# Patient Record
Sex: Male | Born: 1950
Health system: Southern US, Community
[De-identification: ages and names within clinical notes are randomized; demographics above are authoritative.]

## PROBLEM LIST (undated history)

## (undated) DIAGNOSIS — K279 Peptic ulcer, site unspecified, unspecified as acute or chronic, without hemorrhage or perforation: Secondary | ICD-10-CM

## (undated) DIAGNOSIS — N200 Calculus of kidney: Secondary | ICD-10-CM

## (undated) DIAGNOSIS — E663 Overweight: Secondary | ICD-10-CM

## (undated) DIAGNOSIS — D649 Anemia, unspecified: Secondary | ICD-10-CM

## (undated) DIAGNOSIS — I10 Essential (primary) hypertension: Secondary | ICD-10-CM

## (undated) DIAGNOSIS — I2510 Atherosclerotic heart disease of native coronary artery without angina pectoris: Secondary | ICD-10-CM

## (undated) DIAGNOSIS — E785 Hyperlipidemia, unspecified: Secondary | ICD-10-CM

## (undated) DIAGNOSIS — Z9289 Personal history of other medical treatment: Secondary | ICD-10-CM

## (undated) DIAGNOSIS — K219 Gastro-esophageal reflux disease without esophagitis: Secondary | ICD-10-CM

## (undated) DIAGNOSIS — C61 Malignant neoplasm of prostate: Secondary | ICD-10-CM

## (undated) HISTORY — DX: Hyperlipidemia, unspecified: E78.5

## (undated) HISTORY — DX: Gastro-esophageal reflux disease without esophagitis: K21.9

## (undated) HISTORY — DX: Peptic ulcer, site unspecified, unspecified as acute or chronic, without hemorrhage or perforation: K27.9

## (undated) HISTORY — DX: Essential (primary) hypertension: I10

## (undated) HISTORY — DX: Atherosclerotic heart disease of native coronary artery without angina pectoris: I25.10

## (undated) HISTORY — DX: Personal history of other medical treatment: Z92.89

## (undated) HISTORY — PX: TONSILLECTOMY: SUR1361

## (undated) HISTORY — DX: Calculus of kidney: N20.0

## (undated) HISTORY — DX: Overweight: E66.3

---

## 1997-10-10 ENCOUNTER — Encounter: Admission: RE | Admit: 1997-10-10 | Discharge: 1997-10-10 | Payer: Self-pay | Admitting: Family Medicine

## 1997-10-22 ENCOUNTER — Encounter: Admission: RE | Admit: 1997-10-22 | Discharge: 1997-10-22 | Payer: Self-pay | Admitting: Family Medicine

## 1997-12-21 ENCOUNTER — Encounter: Admission: RE | Admit: 1997-12-21 | Discharge: 1997-12-21 | Payer: Self-pay | Admitting: Family Medicine

## 1998-01-04 ENCOUNTER — Encounter: Admission: RE | Admit: 1998-01-04 | Discharge: 1998-01-04 | Payer: Self-pay | Admitting: Family Medicine

## 1998-10-28 ENCOUNTER — Encounter: Admission: RE | Admit: 1998-10-28 | Discharge: 1998-10-28 | Payer: Self-pay | Admitting: Family Medicine

## 1999-02-21 ENCOUNTER — Encounter: Admission: RE | Admit: 1999-02-21 | Discharge: 1999-02-21 | Payer: Self-pay | Admitting: Family Medicine

## 1999-07-22 ENCOUNTER — Encounter: Admission: RE | Admit: 1999-07-22 | Discharge: 1999-07-22 | Payer: Self-pay | Admitting: Sports Medicine

## 1999-09-22 ENCOUNTER — Encounter: Admission: RE | Admit: 1999-09-22 | Discharge: 1999-09-22 | Payer: Self-pay | Admitting: Family Medicine

## 2000-03-10 ENCOUNTER — Encounter: Admission: RE | Admit: 2000-03-10 | Discharge: 2000-03-10 | Payer: Self-pay | Admitting: Family Medicine

## 2001-06-14 ENCOUNTER — Encounter: Admission: RE | Admit: 2001-06-14 | Discharge: 2001-06-14 | Payer: Self-pay | Admitting: Family Medicine

## 2002-01-05 ENCOUNTER — Encounter: Admission: RE | Admit: 2002-01-05 | Discharge: 2002-01-05 | Payer: Self-pay | Admitting: Family Medicine

## 2002-02-01 ENCOUNTER — Encounter: Admission: RE | Admit: 2002-02-01 | Discharge: 2002-02-01 | Payer: Self-pay | Admitting: Family Medicine

## 2002-08-23 ENCOUNTER — Encounter: Admission: RE | Admit: 2002-08-23 | Discharge: 2002-08-23 | Payer: Self-pay | Admitting: Family Medicine

## 2002-08-24 ENCOUNTER — Encounter: Admission: RE | Admit: 2002-08-24 | Discharge: 2002-08-24 | Payer: Self-pay | Admitting: Family Medicine

## 2003-03-15 ENCOUNTER — Encounter: Admission: RE | Admit: 2003-03-15 | Discharge: 2003-03-15 | Payer: Self-pay | Admitting: Sports Medicine

## 2003-04-06 ENCOUNTER — Encounter: Admission: RE | Admit: 2003-04-06 | Discharge: 2003-04-06 | Payer: Self-pay | Admitting: Family Medicine

## 2003-08-23 ENCOUNTER — Encounter: Admission: RE | Admit: 2003-08-23 | Discharge: 2003-08-23 | Payer: Self-pay | Admitting: Sports Medicine

## 2003-10-29 ENCOUNTER — Encounter: Admission: RE | Admit: 2003-10-29 | Discharge: 2003-10-29 | Payer: Self-pay | Admitting: Family Medicine

## 2003-11-08 ENCOUNTER — Encounter: Admission: RE | Admit: 2003-11-08 | Discharge: 2003-11-08 | Payer: Self-pay | Admitting: Family Medicine

## 2004-03-21 ENCOUNTER — Ambulatory Visit: Payer: Self-pay | Admitting: Family Medicine

## 2004-06-06 ENCOUNTER — Ambulatory Visit: Payer: Self-pay | Admitting: Sports Medicine

## 2004-12-31 ENCOUNTER — Ambulatory Visit: Payer: Self-pay | Admitting: Family Medicine

## 2005-02-16 ENCOUNTER — Ambulatory Visit: Payer: Self-pay | Admitting: Family Medicine

## 2006-02-09 ENCOUNTER — Ambulatory Visit: Payer: Self-pay | Admitting: Sports Medicine

## 2006-06-04 ENCOUNTER — Encounter (INDEPENDENT_AMBULATORY_CARE_PROVIDER_SITE_OTHER): Payer: Self-pay | Admitting: Family Medicine

## 2006-06-04 ENCOUNTER — Ambulatory Visit: Payer: Self-pay | Admitting: Family Medicine

## 2006-06-04 LAB — CONVERTED CEMR LAB
AST: 27 units/L (ref 0–37)
Alkaline Phosphatase: 68 units/L (ref 39–117)
BUN: 19 mg/dL (ref 6–23)
Creatinine, Ser: 1.18 mg/dL (ref 0.40–1.50)
Potassium: 3.4 meq/L — ABNORMAL LOW (ref 3.5–5.3)
Total Bilirubin: 0.5 mg/dL (ref 0.3–1.2)

## 2006-06-09 ENCOUNTER — Ambulatory Visit: Payer: Self-pay | Admitting: Gastroenterology

## 2006-07-13 ENCOUNTER — Ambulatory Visit: Payer: Self-pay | Admitting: Gastroenterology

## 2006-07-13 ENCOUNTER — Encounter (INDEPENDENT_AMBULATORY_CARE_PROVIDER_SITE_OTHER): Payer: Self-pay | Admitting: Specialist

## 2006-07-13 ENCOUNTER — Encounter: Payer: Self-pay | Admitting: Family Medicine

## 2006-07-22 DIAGNOSIS — I10 Essential (primary) hypertension: Secondary | ICD-10-CM

## 2006-07-22 DIAGNOSIS — E785 Hyperlipidemia, unspecified: Secondary | ICD-10-CM

## 2006-07-22 DIAGNOSIS — J309 Allergic rhinitis, unspecified: Secondary | ICD-10-CM | POA: Insufficient documentation

## 2006-08-02 ENCOUNTER — Telehealth (INDEPENDENT_AMBULATORY_CARE_PROVIDER_SITE_OTHER): Payer: Self-pay | Admitting: *Deleted

## 2006-08-02 ENCOUNTER — Encounter: Payer: Self-pay | Admitting: Family Medicine

## 2006-08-02 ENCOUNTER — Ambulatory Visit: Payer: Self-pay | Admitting: Family Medicine

## 2006-08-02 LAB — CONVERTED CEMR LAB
BUN: 17 mg/dL (ref 6–23)
Blood in Urine, dipstick: NEGATIVE
Chloride: 101 meq/L (ref 96–112)
Glucose, Bld: 93 mg/dL (ref 70–99)
Ketones, urine, test strip: NEGATIVE
Nitrite: NEGATIVE
Potassium: 3.4 meq/L — ABNORMAL LOW (ref 3.5–5.3)
Protein, U semiquant: NEGATIVE
Urobilinogen, UA: 1

## 2007-03-30 ENCOUNTER — Encounter: Payer: Self-pay | Admitting: Family Medicine

## 2007-03-30 ENCOUNTER — Ambulatory Visit: Payer: Self-pay | Admitting: Family Medicine

## 2007-03-30 LAB — CONVERTED CEMR LAB
AST: 26 units/L (ref 0–37)
Albumin: 4.3 g/dL (ref 3.5–5.2)
Alkaline Phosphatase: 63 units/L (ref 39–117)
Indirect Bilirubin: 0.3 mg/dL (ref 0.0–0.9)
Total Protein: 7.5 g/dL (ref 6.0–8.3)

## 2008-01-20 ENCOUNTER — Encounter (INDEPENDENT_AMBULATORY_CARE_PROVIDER_SITE_OTHER): Payer: Self-pay | Admitting: *Deleted

## 2008-01-20 ENCOUNTER — Ambulatory Visit: Payer: Self-pay | Admitting: Family Medicine

## 2008-02-03 ENCOUNTER — Encounter: Payer: Self-pay | Admitting: Family Medicine

## 2008-02-03 ENCOUNTER — Ambulatory Visit: Payer: Self-pay | Admitting: Family Medicine

## 2008-02-03 DIAGNOSIS — E663 Overweight: Secondary | ICD-10-CM

## 2008-02-03 DIAGNOSIS — E669 Obesity, unspecified: Secondary | ICD-10-CM | POA: Insufficient documentation

## 2008-02-08 LAB — CONVERTED CEMR LAB
ALT: 26 units/L (ref 0–53)
AST: 30 units/L (ref 0–37)
BUN: 19 mg/dL (ref 6–23)
CO2: 25 meq/L (ref 19–32)
Cholesterol: 159 mg/dL (ref 0–200)
Creatinine, Ser: 1.25 mg/dL (ref 0.40–1.50)
HDL: 43 mg/dL (ref >39)
Total Bilirubin: 0.4 mg/dL (ref 0.3–1.2)
Total CHOL/HDL Ratio: 3.7
VLDL: 22 mg/dL (ref 0–40)

## 2008-03-06 ENCOUNTER — Encounter: Payer: Self-pay | Admitting: Family Medicine

## 2008-05-25 DIAGNOSIS — D649 Anemia, unspecified: Secondary | ICD-10-CM

## 2008-05-25 HISTORY — DX: Anemia, unspecified: D64.9

## 2008-05-25 HISTORY — PX: CARDIAC CATHETERIZATION: SHX172

## 2008-08-15 ENCOUNTER — Telehealth: Payer: Self-pay | Admitting: Family Medicine

## 2008-08-31 ENCOUNTER — Ambulatory Visit: Payer: Self-pay | Admitting: Family Medicine

## 2008-08-31 LAB — CONVERTED CEMR LAB

## 2008-10-02 ENCOUNTER — Telehealth: Payer: Self-pay | Admitting: Family Medicine

## 2008-10-02 ENCOUNTER — Ambulatory Visit (HOSPITAL_COMMUNITY): Admission: RE | Admit: 2008-10-02 | Discharge: 2008-10-02 | Payer: Self-pay | Admitting: Family Medicine

## 2008-10-02 ENCOUNTER — Ambulatory Visit: Payer: Self-pay | Admitting: Family Medicine

## 2008-10-02 DIAGNOSIS — R9431 Abnormal electrocardiogram [ECG] [EKG]: Secondary | ICD-10-CM

## 2008-10-02 DIAGNOSIS — Z9289 Personal history of other medical treatment: Secondary | ICD-10-CM

## 2008-10-02 HISTORY — DX: Personal history of other medical treatment: Z92.89

## 2008-10-08 ENCOUNTER — Encounter: Admission: RE | Admit: 2008-10-08 | Discharge: 2008-10-08 | Payer: Self-pay | Admitting: Cardiovascular Disease

## 2008-10-12 ENCOUNTER — Encounter: Payer: Self-pay | Admitting: Family Medicine

## 2008-10-12 ENCOUNTER — Inpatient Hospital Stay (HOSPITAL_COMMUNITY): Admission: AD | Admit: 2008-10-12 | Discharge: 2008-10-13 | Payer: Self-pay | Admitting: Cardiovascular Disease

## 2008-10-13 ENCOUNTER — Ambulatory Visit: Payer: Self-pay | Admitting: Internal Medicine

## 2008-10-13 ENCOUNTER — Encounter: Payer: Self-pay | Admitting: Internal Medicine

## 2008-10-15 ENCOUNTER — Encounter: Payer: Self-pay | Admitting: Internal Medicine

## 2008-10-19 ENCOUNTER — Telehealth: Payer: Self-pay | Admitting: Internal Medicine

## 2008-10-29 ENCOUNTER — Encounter: Payer: Self-pay | Admitting: Family Medicine

## 2008-11-02 ENCOUNTER — Encounter: Payer: Self-pay | Admitting: Family Medicine

## 2008-11-06 ENCOUNTER — Encounter: Payer: Self-pay | Admitting: Family Medicine

## 2008-11-07 ENCOUNTER — Encounter: Payer: Self-pay | Admitting: Family Medicine

## 2008-11-07 DIAGNOSIS — Q279 Congenital malformation of peripheral vascular system, unspecified: Secondary | ICD-10-CM | POA: Insufficient documentation

## 2008-12-23 HISTORY — PX: APPENDECTOMY: SHX54

## 2009-03-18 ENCOUNTER — Ambulatory Visit: Payer: Self-pay | Admitting: Family Medicine

## 2009-03-18 ENCOUNTER — Encounter: Payer: Self-pay | Admitting: Family Medicine

## 2009-03-18 DIAGNOSIS — D509 Iron deficiency anemia, unspecified: Secondary | ICD-10-CM | POA: Insufficient documentation

## 2009-03-19 ENCOUNTER — Encounter: Payer: Self-pay | Admitting: Family Medicine

## 2009-03-19 LAB — CONVERTED CEMR LAB
AST: 26 units/L (ref 0–37)
Albumin: 4.4 g/dL (ref 3.5–5.2)
Alkaline Phosphatase: 65 units/L (ref 39–117)
BUN: 14 mg/dL (ref 6–23)
Basophils Absolute: 0 10*3/uL (ref 0.0–0.1)
Basophils Relative: 1 % (ref 0–1)
Creatinine, Ser: 1.29 mg/dL (ref 0.40–1.50)
Direct LDL: 112 mg/dL — ABNORMAL HIGH
Eosinophils Absolute: 0.3 10*3/uL (ref 0.0–0.7)
Eosinophils Relative: 4 % (ref 0–5)
Glucose, Bld: 89 mg/dL (ref 70–99)
HCT: 42.7 % (ref 39.0–52.0)
Hemoglobin: 14 g/dL (ref 13.0–17.0)
Lymphocytes Relative: 30 % (ref 12–46)
MCHC: 32.8 g/dL (ref 30.0–36.0)
Monocytes Absolute: 0.7 10*3/uL (ref 0.1–1.0)
Platelets: 240 10*3/uL (ref 150–400)
RDW: 13.6 % (ref 11.5–15.5)
Total Bilirubin: 0.4 mg/dL (ref 0.3–1.2)

## 2009-03-27 ENCOUNTER — Encounter: Payer: Self-pay | Admitting: Family Medicine

## 2009-03-28 LAB — CONVERTED CEMR LAB
OCCULT 1: POSITIVE
OCCULT 2: POSITIVE

## 2009-04-08 ENCOUNTER — Telehealth: Payer: Self-pay | Admitting: Family Medicine

## 2009-07-08 ENCOUNTER — Telehealth: Payer: Self-pay | Admitting: Family Medicine

## 2009-07-15 ENCOUNTER — Encounter: Payer: Self-pay | Admitting: Family Medicine

## 2009-07-15 ENCOUNTER — Ambulatory Visit: Payer: Self-pay | Admitting: Family Medicine

## 2009-07-16 LAB — CONVERTED CEMR LAB
Basophils Relative: 1 % (ref 0–1)
Calcium: 8.7 mg/dL (ref 8.4–10.5)
Chloride: 101 meq/L (ref 96–112)
Creatinine, Ser: 1.2 mg/dL (ref 0.40–1.50)
Eosinophils Absolute: 0.8 10*3/uL — ABNORMAL HIGH (ref 0.0–0.7)
HDL: 41 mg/dL (ref >39)
Lymphs Abs: 2.5 10*3/uL (ref 0.7–4.0)
MCV: 84.6 fL (ref 78.0–100.0)
Monocytes Relative: 10 % (ref 3–12)
Neutro Abs: 2.3 10*3/uL (ref 1.7–7.7)
Neutrophils Relative %: 36 % — ABNORMAL LOW (ref 43–77)
Platelets: 283 10*3/uL (ref 150–400)
RBC: 5.14 M/uL (ref 4.22–5.81)
Sodium: 139 meq/L (ref 135–145)
Total CHOL/HDL Ratio: 3.7
Triglycerides: 72 mg/dL (ref <150)
WBC: 6.3 10*3/uL (ref 4.0–10.5)

## 2010-01-06 ENCOUNTER — Ambulatory Visit: Payer: Self-pay | Admitting: Family Medicine

## 2010-01-06 DIAGNOSIS — I861 Scrotal varices: Secondary | ICD-10-CM

## 2010-01-13 ENCOUNTER — Encounter: Payer: Self-pay | Admitting: Family Medicine

## 2010-01-22 ENCOUNTER — Encounter: Payer: Self-pay | Admitting: Family Medicine

## 2010-06-26 NOTE — Assessment & Plan Note (Signed)
Summary: F/U, new varicocele   Vital Signs:  Patient profile:   60 year old male Weight:      192.5 pounds Temp:     98.3 degrees F oral Pulse rate:   71 / minute Pulse rhythm:   regular BP sitting:   128 / 83  (left arm) Cuff size:   large  Vitals Entered By: Loralee Pacas CMA (January 06, 2010 3:37 PM) Comments pt stated that he has noticed a dull ache in left testicle x 3 months, pain comes and goes. states that when he takes tylenol the pain will go away. states that its not intolorable but he has concern   Primary Provider:  Majel Homer MD   History of Present Illness: Pt reports that his blood pressure has been well controlled.  He has it checked by a nurse at work.  He does report occasional continued cough but has not undergone a trial off of his ACE.  This was discussed previously with him but he decided against trying it as he was happy with his blood pressure.  Pt. continues on his statin.  Reports no problems with muscle aches/weakness and no changes in the color of his urine.  Last lipid panel 2/11, well controlled.  The patient does have a history of CAD dx on cardiac cath.  He has no symptoms from this and reports no chest pain, DOE, or syncope.  The patient has lost 4.9 lbs since his last visit.  He expresses desire to continue weight loss.  The patient also reports left testicular pain that has been present for three months.  He reports that the pain is a dull ache that doesn't localize well.  It is only occasionally present, gets better with Motrin, and is seems to be made a bit worse by heavy lifting.  He has noted no swelling in his left testical and no changes on self exam.  Allergies: No Known Drug Allergies  Past History:  Past Medical History: Last updated: 07/15/2009  HTN  Hyperlipidemia   Allergic Rhinitis Overweight GERD Non critical CAD- Catherization 10/12/2008  Past Surgical History: Last updated: 07/15/2009 Cardiac Catherization  10/12/2008  Review of Systems       The patient complains of weight loss.  The patient denies anorexia, fever, decreased hearing, hoarseness, chest pain, syncope, dyspnea on exertion, peripheral edema, prolonged cough, headaches, abdominal pain, melena, hematochezia, muscle weakness, difficulty walking, and unusual weight change.    Physical Exam  General:  overweight-appearing.   Neck:  No deformities, masses, or tenderness noted. Lungs:  Normal respiratory effort, chest expands symmetrically. Lungs are clear to auscultation, no crackles or wheezes. Heart:  Normal rate and regular rhythm. S1 and S2 normal without gallop, murmur, click, rub or other extra sounds. Abdomen:  Bowel sounds positive,abdomen soft and non-tender without masses, organomegaly or hernias noted. Genitalia:  circumcised, no scrotal masses, no testicular masses or atrophy, no cutaneous lesions, and no urethral discharge.  Mild dilation of venous plexus on left.  No inguinal hernias noted. Extremities:  No clubbing, cyanosis, edema, or deformity noted with normal full range of motion of all joints.   Skin:  Intact without suspicious lesions or rashes   Impression & Recommendations:  Problem # 1:  HYPERTENSION, BENIGN SYSTEMIC (ICD-401.1)  Patient remains on his medications, blood pressure is well controlled.  Will continue current management.  Pt. does have an intermittent chronic cough.  Advised that this can be caused by lisinopril and that he could do a  one month trial without the medication to determine if it is causing it.  Pt is not interested in this at this time.  His updated medication list for this problem includes:    Hydrochlorothiazide 25 Mg Tabs (Hydrochlorothiazide) .[name]... Take 1 tablet by mouth once a day    Metoprolol Tartrate 25 Mg Tabs (Metoprolol tartrate) .[name]... 1/2 tablet by mouth two times a day    Lisinopril 10 Mg Tabs (Lisinopril) .[name]... 1/2 tablet by mouth daily for high blood  pressure  Orders: [facility]- Est  Level 4 (04540)  Problem # 2:  HYPERLIPIDEMIA (ICD-272.4)  Patient does not report any problems with his lipitor.  Lipids last checked in 2/11 and were at goal.  Will continue current management.  His updated medication list for this problem includes:    Lipitor 40 Mg Tabs (Atorvastatin calcium) .[name]... Take 1 tablet by mouth once a day  Orders: [facility]- Est  Level 4 (99214)  Problem # 3:  OVERWEIGHT (ICD-278.02)  Pt. has lost 5 pounds since last visit.  Congratulated on weight loss.  Understands the need for further weight loss.  Discussed the fact that his goal weight isprobably in the 150-160lb range.  Orders: [facility]- Est  Level 4 (98119)  Problem # 4:  VARICOCELE (ICD-456.4)  Pt. appears to have a left sided varicocele.  There is no pain present on exam.  Do not feel that any additional workup is necessary as there are no other abnormalities on exam.  Pt. advised to use NSAIDs, elevation, support, and rest to alleviate discomfort.  Pt. to call for any increased pain/discomfort or changes on self-exam.  Orders: [facility]- Est  Level 4 (14782)  Complete Medication List: 1)  Aspirin Ec 81 Mg Tbec (Aspirin) .... Take 1 tablet by mouth every morning 2)  Hydrochlorothiazide 25 Mg Tabs (Hydrochlorothiazide) .... Take 1 tablet by mouth once a day 3)  Lipitor 40 Mg Tabs (Atorvastatin calcium) .... Take 1 tablet by mouth once a day 4)  Nasonex 50 Mcg/act Susp (Mometasone furoate) .... Spray 2 spray as directed once a day 5)  Protonix 40 Mg Tbec (Pantoprazole sodium) .... One tablet by mouth daily 6)  Metoprolol Tartrate 25 Mg Tabs (Metoprolol tartrate) .... 1/2 tablet by mouth two times a day 7)  Ferrous Sulfate 325 (65 Fe) Mg Tabs (Ferrous sulfate) .... One tablet by mouth once a day 8)  Lisinopril 10 Mg Tabs (Lisinopril) .... 1/2 tablet by mouth daily for high blood pressure  Patient Instructions: 1)  It was great to see you today! 2)  Please find out what would be  the best way for [place] to get you your medications.  Call the clinic and let them know if you need to pick up written prescriptions, if we need to mail them in, or if I should just call them in. 3)  For your testicular discomfort continue to take ibuprofen, make sure you are wearing underwear that give you good support, and try to avoid long periods of standing or heavy lifting when you are experiencing the discomfort. 4)  Congratulations on loosing 5 lbs!  Keep up the good work! 5)  Please schedule a follow-up appointment in 6 months.

## 2010-06-26 NOTE — Assessment & Plan Note (Signed)
Summary: f/u chronic issues   Vital Signs:  Patient profile:   60 year old male Weight:      197.4 pounds Temp:     98.3 degrees F oral Pulse rate:   64 / minute BP sitting:   117 / 79  (left arm) Cuff size:   large  Vitals Entered By: Loralee Pacas CMA (July 15, 2009 3:11 PM)  Primary Care Provider:  Marisue Ivan  MD   History of Present Illness: 60yo M here for f/u chronic issues  HTN: No adverse effects from medication although reports intermittent dry cough.  BP typically runs in 110s systolic.  Was borderline elevated at last visit. No dizziness, HA, CP, palpitations, or swelling.  HLD: Tolerating medication.  No adverse effects.  No muscle pain or abd pain.  Non-critical CAD: Hx of abnl exercise treadmill test (10/02/2008) thus referred to cardiology where he underwent cardiac cath on 10/12/2008.  Found to have concritical CAD w/ nl left ventricular function.  He was started on metoprolol 12.5mg  two times a day.    Overweight: Lost 4 lbs since last visit.  States he has not been as active during the cold weather.  He is planning on getting back to his exercise regimen.    Preventative:   Declines the flu shot.  Denies any feves, unintentional wt loss, or urinary symptoms.   Current Medications (verified): 1)  Aspirin Ec 81 Mg Tbec (Aspirin) .... Take 1 Tablet By Mouth Every Morning 2)  Hydrochlorothiazide 25 Mg Tabs (Hydrochlorothiazide) .... Take 1 Tablet By Mouth Once A Day 3)  Lipitor 40 Mg Tabs (Atorvastatin Calcium) .... Take 1 Tablet By Mouth Once A Day 4)  Nasonex 50 Mcg/act Susp (Mometasone Furoate) .... Spray 2 Spray As Directed Once A Day 5)  Protonix 40 Mg Tbec (Pantoprazole Sodium) .... One Tablet By Mouth Daily 6)  Metoprolol Tartrate 25 Mg Tabs (Metoprolol Tartrate) .... 1/2 Tablet By Mouth Two Times A Day 7)  Ferrous Sulfate 325 (65 Fe) Mg Tabs (Ferrous Sulfate) .... One Tablet By Mouth Three Times A Day For Anemia 8)  Lisinopril 10 Mg Tabs  (Lisinopril) .... 1/2 Tablet By Mouth Daily For High Blood Pressure  Allergies (verified): No Known Drug Allergies  Past History:  Past Medical History:  HTN  Hyperlipidemia   Allergic Rhinitis Overweight GERD Non critical CAD- Catherization 10/12/2008  Past Surgical History: Cardiac Catherization 10/12/2008  Family History: Reviewed history from 02/03/2008 and no changes required. Father - died 19 y.o., HTN, CVA, heart problems, no AMI Mother- HTN, cervical cancer, died 38 No family history of diabetes, colon CA, or prostate CA  Social History: married x 30+years; 3 children, 4 grandsons  no smoking or ETOH;  Retired Pensions consultant formerly at [employer];  likes to golf (golfs 3-4 times a week); no exercise and rides cart while golfing  Review of Systems       No dizziness, HA, CP, palpitations, or swelling. No adverse effects.  No muscle pain or abd pain. Denies any feves, unintentional wt loss, or urinary symptoms.  Physical Exam  General:  VS Reviewed. Well appearing, NAD.  Neck:  supple, full ROM, no goiter or mass  Lungs:  Normal respiratory effort, chest expands symmetrically. Lungs are clear to auscultation, no crackles or wheezes. Heart:  Normal rate and regular rhythm. S1 and S2 normal without gallop, murmur, click, rub or other extra sounds. Abdomen:  Soft, NT, ND, no HSM, active BS  Extremities:  no edema Neurologic:  no  focal deficits Skin:  nl color and turgor   Impression & Recommendations:  Problem # 1:  HYPERTENSION, BENIGN SYSTEMIC (ICD-401.1) Assessment Improved At goal (<140/90).  No changes to regimen at this time. Possible concern for adverse rxn to the Lisinopril - possible cough.  Advised to hold the medication for 3-4 days and note any changes in symptoms and then restart the medication and see if the symptoms return or change.   Refills provided for the next 6 months.  Will check renal function and electrolytes. f/u in 6  months   His updated medication list for this problem includes:    Hydrochlorothiazide 25 Mg Tabs (Hydrochlorothiazide) .[name]... Take 1 tablet by mouth once a day    Metoprolol Tartrate 25 Mg Tabs (Metoprolol tartrate) .[name]... 1/2 tablet by mouth two times a day    Lisinopril 10 Mg Tabs (Lisinopril) .[name]... 1/2 tablet by mouth daily for high blood pressure  Orders: Basic Met-[facility] (95188-41660) [facility]- Est  Level 4 (63016)  Problem # 2:  HYPERLIPIDEMIA (ICD-272.4) Assessment: Unchanged Check lipid panel today. No changes to regimen at this time.  Tolerating medication.   LFTs last checked in 02/2009 and wnls.  His updated medication list for this problem includes:    Lipitor 40 Mg Tabs (Atorvastatin calcium) .[name]... Take 1 tablet by mouth once a day  Orders: T-Lipid Profile ([address]093-23557) Basic Met-[facility] (32202-54270) [facility]- Est  Level 4 (62376)  Problem # 3:  OVERWEIGHT (ICD-278.02) Assessment: Improved  Lost 4lbs since last visit. Discussed wt loss and exercise strategies.  Orders: [facility]- Est  Level 4 (28315)  Problem # 4:  ANEMIA, IRON DEFICIENCY (ICD-280.9) Assessment: Unchanged Recheck CBC today. Continue on current ferrous sulfate regimen.  His updated medication list for this problem includes:    Ferrous Sulfate 325 (65 Fe) Mg Tabs (Ferrous sulfate) ..... One tablet by mouth three times a day for anemia  Orders: CBC w/Diff-[facility] (17616) [facility]- Est  Level 4 (07371)  Complete Medication List: 1)  Aspirin Ec 81 Mg Tbec (Aspirin) .... Take 1 tablet by mouth every morning 2)  Hydrochlorothiazide 25 Mg Tabs (Hydrochlorothiazide) .... Take 1 tablet by mouth once a day 3)  Lipitor 40 Mg Tabs (Atorvastatin calcium) .... Take 1 tablet by mouth once a day 4)  Nasonex 50 Mcg/act Susp (Mometasone furoate) .... Spray 2 spray as directed once a day 5)  Protonix 40 Mg Tbec (Pantoprazole sodium) .... One tablet by mouth daily 6)  Metoprolol Tartrate 25 Mg Tabs (Metoprolol tartrate) .... 1/2 tablet  by mouth two times a day 7)  Ferrous Sulfate 325 (65 Fe) Mg Tabs (Ferrous sulfate) .... One tablet by mouth three times a day for anemia 8)  Lisinopril 10 Mg Tabs (Lisinopril) .... 1/2 tablet by mouth daily for high blood pressure  Patient Instructions: 1)  Please schedule a follow-up appointment in 6 months. 2)  Stop taking the Lisinopril for 3-4 days and see if your cough improves.  Then restart the Lisinopril and see if the cough worsens.  Call me if this is the case. 3)  I'll mail your lab results to you in the next 3-4 days. Prescriptions: LISINOPRIL 10 MG TABS (LISINOPRIL) 1/2 tablet by mouth daily for high blood pressure  #90 x 0   Entered and Authorized by:   Marisue Ivan  MD   Signed by:   Marisue Ivan  MD on 07/15/2009   Method used:   Electronically to        [facility]  [address] [phone]* (retail)       [address]       [place], [place]  [address]       Ph: [phone] or [phone]       Fax: [phone]   RxID:   [id number] FERROUS SULFATE 325 (65 FE) MG TABS (FERROUS SULFATE) one tablet by mouth three times a day for anemia  #180 x 1   Entered and Authorized by:   Marisue Ivan  MD   Signed by:   Marisue Ivan  MD on 07/15/2009   Method used:   Electronically to        [facility]  [address] [phone]* (retail)       [address]       [place], [place]  [address]       Ph: [phone] or [phone]       Fax: [phone]   RxID:   [id number] METOPROLOL TARTRATE 25 MG TABS (METOPROLOL TARTRATE) 1/2 tablet by mouth two times a day  #90 x 1   Entered and Authorized by:   Marisue Ivan  MD   Signed by:   Marisue Ivan  MD on 07/15/2009   Method used:   [facility]        [facility]  [address] [phone]* (retail)       [address]       [place], [place]  [address]       Ph: [phone] or [phone]       Fax: [phone]   RxID:    [id number] PROTONIX 40 MG TBEC (PANTOPRAZOLE SODIUM) one tablet by mouth daily  #90 x 1   Entered and Authorized by:   Marisue Ivan  MD   Signed by:   Marisue Ivan  MD on 07/15/2009   Method used:   [facility]ically to        [facility]  [address] [phone]* (retail)       [address]       [place], [place]  [address]       Ph: [phone] or [phone]       Fax: [phone]   RxID:   [id number] NASONEX 50 MCG/ACT SUSP (MOMETASONE FUROATE) Spray 2 spray as directed once a day  #1 x 5   Entered and Authorized by:   Marisue Ivan  MD   Signed by:   Marisue Ivan  MD on 07/15/2009   Method used:   [facility]        [facility]  [address] [phone]* (retail)       [address]       [place], [place]  [id number]       Ph: [phone] or [phone]       Fax: [phone]   RxID:   [id number] LIPITOR 40 MG TABS (ATORVASTATIN CALCIUM) Take 1 tablet by mouth once a day  #90 x 1   Entered and Authorized by:   Marisue Ivan  MD   Signed by:   Marisue Ivan  MD on 07/15/2009   Method used:   [facility]ron[facility] to        [facility]  [address] [phone]* (retail)       [address]  [place], [place]  [address]       Ph: [phone] or [phone]       Fax: [phone]   RxID:   [id number] HYDROCHLOROTHIAZIDE 25 MG TABS (HYDROCHLOROTHIAZIDE) Take 1 tablet by mouth once a day  #90 x 1   Entered and Authorized by:   Marisue Ivan  MD   Signed by:   Marisue Ivan  MD on 07/15/2009   Method used:   [facility] to        [facility] [address] [phone]* (retail)       [address]       [place], [place]  [address]       Ph: [phone] or [phone]       Fax: [phone]   RxID:   [id number]    Prevention & Chronic Care Immunizations   Influenza vaccine: refused  (08/31/2008)   Influenza vaccine deferral:  Refused  (07/15/2009)   Influenza vaccine due: 08/31/2009    Tetanus booster: 05/25/2001: Done.   Tetanus booster due: 05/26/2011    Pneumococcal vaccine: Not documented    H. zoster vaccine: Not documented   H. zoster vaccine deferral: Refused  (07/15/2009)  Colorectal Screening   Hemoccult: abnormal  (04/19/2009)   Hemoccult due: 04/19/2010    Colonoscopy: Location:  [facility]    (10/13/2008)   Colonoscopy due: 07/14/2011  Other Screening   PSA: 3.90  (03/18/2009)   PSA due due: 08/31/2009   Smoking status: never  (03/18/2009)  Lipids   Total Cholesterol: 159  (02/03/2008)   Lipid panel action/deferral: Lipid Panel ordered   LDL: 94  (02/03/2008)   LDL Direct: 112  (03/18/2009)   HDL: 43  (02/03/2008)   Triglycerides: 110  (02/03/2008)    SGOT (AST): 26  (03/18/2009)   BMP action: Ordered   SGPT (ALT): 28  (03/18/2009)   Alkaline phosphatase: 65  (03/18/2009)   Total bilirubin: 0.4  (03/18/2009)    Lipid flowsheet reviewed?: Yes   Progress toward LDL goal: Unchanged  Hypertension   Last Blood Pressure: 117 / 79  (07/15/2009)   Serum creatinine: 1.29  (03/18/2009)   BMP action: Ordered   Serum potassium 3.8  (03/18/2009)    Hypertension flowsheet reviewed?: Yes   Progress toward BP goal: At goal  Self-Management Support :   Personal Goals (by the next clinic visit) :      Personal blood pressure goal: 140/90  (03/18/2009)     Personal LDL goal: 100  (03/18/2009)    Patient will work on the following items until the next clinic visit to reach self-care goals:     Medications and monitoring: take my medicines every day, check my blood pressure, bring all of my medications to every visit, weigh myself weekly  (07/15/2009)     Eating: drink diet soda or water instead of juice or soda, eat more vegetables, use fresh or frozen vegetables, eat foods that are low in salt, eat baked foods instead of fried foods, eat fruit for snacks and desserts, limit  or avoid alcohol  (07/15/2009)     Activity: take a 30 minute walk every day  (03/18/2009)    Hypertension self-management support: BP self-monitoring log, Written self-care plan, Education handout  (07/15/2009)   Hypertension self-care plan printed.   Hypertension education handout printed    Lipid self-management support: Written self-care plan, Education handout  (07/15/2009)   Lipid  self-care plan printed.   Lipid education handout printed

## 2010-06-26 NOTE — Miscellaneous (Signed)
Summary: med refill via fax  Medications Added PROTONIX 40 MG TBEC (PANTOPRAZOLE SODIUM) one tablet by mouth daily METOPROLOL TARTRATE 25 MG TABS (METOPROLOL TARTRATE) 1/2 tablet by mouth two times a day       Clinical Lists Changes  Medications: Changed medication from PROTONIX 40 MG TBEC (PANTOPRAZOLE SODIUM) one tablet by mouth daily to PROTONIX 40 MG TBEC (PANTOPRAZOLE SODIUM) one tablet by mouth daily - Signed Changed medication from METOPROLOL TARTRATE 25 MG TABS (METOPROLOL TARTRATE) 1/2 tablet by mouth two times a day to METOPROLOL TARTRATE 25 MG TABS (METOPROLOL TARTRATE) 1/2 tablet by mouth two times a day - Signed Rx of HYDROCHLOROTHIAZIDE 25 MG TABS (HYDROCHLOROTHIAZIDE) Take 1 tablet by mouth once a day;  #90 x 3;  Signed;  Entered by: Doralee Albino MD;  Authorized by: Doralee Albino MD;  Method used: Handwritten Rx of NASONEX 50 MCG/ACT SUSP (MOMETASONE FUROATE) Spray 2 spray as directed once a day;  #1 x 3;  Signed;  Entered by: Doralee Albino MD;  Authorized by: Doralee Albino MD;  Method used: Handwritten Rx of PROTONIX 40 MG TBEC (PANTOPRAZOLE SODIUM) one tablet by mouth daily;  #90 x 3;  Signed;  Entered by: Doralee Albino MD;  Authorized by: Doralee Albino MD;  Method used: Handwritten Rx of METOPROLOL TARTRATE 25 MG TABS (METOPROLOL TARTRATE) 1/2 tablet by mouth two times a day;  #90 x 3;  Signed;  Entered by: Doralee Albino MD;  Authorized by: Doralee Albino MD;  Method used: Handwritten Rx of LISINOPRIL 10 MG TABS (LISINOPRIL) 1/2 tablet by mouth daily for high blood pressure;  #45 x 3;  Signed;  Entered by: Doralee Albino MD;  Authorized by: Doralee Albino MD;  Method used: Handwritten    Prescriptions: LISINOPRIL 10 MG TABS (LISINOPRIL) 1/2 tablet by mouth daily for high blood pressure  #45 x 3   Entered and Authorized by:   Doralee Albino MD   Signed by:   Doralee Albino MD on 01/13/2010   Method used:   Handwritten   RxID:   [id number] METOPROLOL TARTRATE  25 MG TABS (METOPROLOL TARTRATE) 1/2 tablet by mouth two times a day  #90 x 3   Entered and Authorized by:   Doralee Albino MD   Signed by:   Doralee Albino MD on 01/13/2010   Method used:   Handwritten   RxID:   [id number] PROTONIX 40 MG TBEC (PANTOPRAZOLE SODIUM) one tablet by mouth daily  #90 x 3   Entered and Authorized by:   Doralee Albino MD   Signed by:   Doralee Albino MD on 01/13/2010   Method used:   Handwritten   RxID:   [id number] NASONEX 50 MCG/ACT SUSP (MOMETASONE FUROATE) Spray 2 spray as directed once a day  #1 x 3   Entered and Authorized by:   Doralee Albino MD   Signed by:   Doralee Albino MD on 01/13/2010   Method used:   Handwritten   RxID:   [id number] HYDROCHLOROTHIAZIDE 25 MG TABS (HYDROCHLOROTHIAZIDE) Take 1 tablet by mouth once a day  #90 x 3   Entered and Authorized by:   Doralee Albino MD   Signed by:   Doralee Albino MD on 01/13/2010   Method used:   Handwritten   RxID:   [id number]

## 2010-06-26 NOTE — Consult Note (Signed)
Summary: [facility] - No changes to management  [facility]   Imported By: Clydell Hakim 02/10/2010 16:39:10  _____________________________________________________________________  External Attachment:    Type:   Image     Comment:   External Document

## 2010-06-26 NOTE — Progress Notes (Signed)
Summary: 3 month supply of lipitor, metoprolol, and lisinopril  Medications Added ASPIRIN EC 81 MG TBEC (ASPIRIN) Take 1 tablet by mouth every morning HYDROCHLOROTHIAZIDE 25 MG TABS (HYDROCHLOROTHIAZIDE) Take 1 tablet by mouth once a day HYDROCHLOROTHIAZIDE 25 MG TABS (HYDROCHLOROTHIAZIDE) Take 1 tablet by mouth once a day LIPITOR 40 MG TABS (ATORVASTATIN CALCIUM) Take 1 tablet by mouth once a day LIPITOR 40 MG TABS (ATORVASTATIN CALCIUM) Take 1 tablet by mouth once a day LIPITOR 40 MG TABS (ATORVASTATIN CALCIUM) Take 1 tablet by mouth once a day NASONEX 50 MCG/ACT SUSP (MOMETASONE FUROATE) Spray 2 spray as directed once a day NASONEX 50 MCG/ACT SUSP (MOMETASONE FUROATE) Spray 2 spray as directed once a day PROTONIX 40 MG TBEC (PANTOPRAZOLE SODIUM) one tablet by mouth daily METOPROLOL TARTRATE 25 MG TABS (METOPROLOL TARTRATE) 1/2 tablet by mouth two times a day FERROUS SULFATE 325 (65 FE) MG TABS (FERROUS SULFATE) one tablet by mouth three times a day for anemia LISINOPRIL 10 MG TABS (LISINOPRIL) 1/2 tablet by mouth daily for high blood pressure       Phone Note Refill Request Call back at Home Phone [phone] Message from:  Patient  Refills Requested: Medication #1:  LIPITOR 40 MG TABS Take 1 tablet by mouth once a day  Medication #2:  LISINOPRIL 10 MG TABS 1/2 tablet by mouth daily for high blood pressure.  Medication #3:  METOPROLOL TARTRATE 25 MG TABS 1/2 tablet by mouth two times a day PT USES [facility].  Initial call taken by: Clydell Hakim,  July 08, 2009 9:41 AM  Follow-up for Phone Call        will forward to MD. Follow-up by: Theresia Lo RN,  July 08, 2009 10:24 AM    Prescriptions: LISINOPRIL 10 MG TABS (LISINOPRIL) 1/2 tablet by mouth daily for high blood pressure  #60 x 0   Entered and Authorized by:   Marisue Ivan  MD   Signed by:   Marisue Ivan  MD on 07/08/2009   Method used:   Electronically to        [facility]  (retail)       [address]       [place], [place]  [address]       Ph: [phone]       Fax: [phone]   RxID:   [id number] METOPROLOL TARTRATE 25 MG TABS (METOPROLOL TARTRATE) 1/2 tablet by mouth two times a day  #90 x 0   Entered and Authorized by:   Marisue Ivan  MD   Signed by:   Marisue Ivan  MD on 07/08/2009   Method used:   Electronically to        [facility] (retail)       [address]       [place], [place]  [address]       Ph: [phone]       Fax: [phone]   RxID:   [id number] LIPITOR 40 MG TABS (ATORVASTATIN CALCIUM) Take 1 tablet by mouth once a day  #90 x 0   Entered and Authorized by:   Marisue Ivan  MD   Signed by:   Marisue Ivan  MD on 07/08/2009   Method used:   Electronically to        [facility] (retail)       [address]  [place], [place]  [address]       Ph: [phone]       Fax: [phone]   RxID:   [id number]

## 2010-07-15 ENCOUNTER — Other Ambulatory Visit: Payer: Self-pay | Admitting: Family Medicine

## 2010-07-15 MED ORDER — ATORVASTATIN CALCIUM 40 MG PO TABS: 40.0000 mg | ORAL_TABLET | Freq: Every day | ORAL | Status: DC

## 2010-07-15 NOTE — Progress Notes (Signed)
Refill request for lipitor.  Will refill for three months.  Requires additional appointment for refills beyond this.

## 2010-09-02 LAB — CROSSMATCH

## 2010-09-02 LAB — BASIC METABOLIC PANEL
BUN: 12 mg/dL (ref 6–23)
Calcium: 8.4 mg/dL (ref 8.4–10.5)
Chloride: 105 mEq/L (ref 96–112)
Creatinine, Ser: 1.06 mg/dL (ref 0.4–1.5)
GFR calc non Af Amer: >60 mL/min (ref >60)

## 2010-09-02 LAB — CBC
MCV: 72.1 fL — ABNORMAL LOW (ref 78.0–100.0)
Platelets: 263 10*3/uL (ref 150–400)
WBC: 6.2 10*3/uL (ref 4.0–10.5)

## 2010-09-02 LAB — ABO/RH: ABO/RH(D): AB POS

## 2010-10-07 NOTE — Cardiovascular Report (Signed)
NAMEBRONX, BROGDEN NO.:  [id number]   MEDICAL RECORD NO.:  [id number]          PATIENT TYPE:  INP   LOCATION:  2508                         FACILITY:  [facility]   PHYSICIAN:  Nanetta Batty, M.D.   DATE OF BIRTH:  20-Jan-1951   DATE OF PROCEDURE:  DATE OF DISCHARGE:                            CARDIAC CATHETERIZATION   Mr. Justin Ayala is a delightful 60 year old mildly overweight married  African American, male father of 3, grandfather of 4 grandchildren, who  is referred by Dr. Jennette Kettle for cardiovascular evaluation because of dyspnea  and an abnormal GXT.  His wife is also a patient in our practice and is  seen by Dr. Elsie Lincoln in the past.  His risk factors are positive for  hypertension and hyperlipidemia.  He had a GXT performed on Oct 02, 2008, at [facility], remarkable for 2 mm ST-segment depression  and was referred for further evaluation.   PROCEDURE DESCRIPTION:  The patient was brought to the second floor at  [facility] in a postabsorptive state.  He was  premedicated with p.o. Valium.  His right groin was prepped and shaved  in the usual sterile fashion.  A 1% Xylocaine was used for local  anesthesia.  A 6-French sheath was inserted into the right femoral  artery using standard Seldinger technique.  A 6-French right-left  Judkins diagnostic catheters along with a 6-French pigtail catheter were  used for selective coronary angiography and left ventriculography  respectively.  Visipaque dye was used for the entirety of the case.  Retrograde aortic, left ventricular, and pullback pressures were  recorded.   HEMODYNAMICS:  1. Aortic systolic pressure 123, diastolic pressure 80.  2. Left ventricular systolic pressure 124, end-diastolic pressure 16.   SELECTIVE CORONARY ANGIOGRAPHY:  1. Left main, normal.  2. LAD; LAD was a large vessel which wrapped the apex.  He had give      off a moderate-to-large first diagonal branch with 60-70%  ostial      stenosis.  3. Left circumflex; dominant with scattered 30-50% stenosis in the mid      and distal portion.  4. Right coronary artery; nondominant with minor irregularities.  5. Ventriculography; RAO left ventriculogram was performed using 25 mL      of Visipaque dye at 12 mL per second.  The overall LVEF was      estimated at greater than 50% without focal wall motion      abnormalities.   IMPRESSION:  Mr. Justin Ayala has noncritical coronary artery disease with  normal left ventricular function.  I believe his dyspnea and positive  graded exercise test are related to significant iron-deficiency anemia.  I am going to transfuse him 2 units of packed red blood cells and put  him on a PPI.  We will get GI evaluation in addition.   Sheath was removed and pressure was held in the groin to achieve  hemostasis.  The patient left the lab in stable condition.      Nanetta Batty, M.D.  Electronically Signed     JB/MEDQ  D:  10/12/2008  T:  10/13/2008  Job:  [id number]   cc:   Second Floor [facility]  Nestor Ramp, MD

## 2010-10-07 NOTE — Consult Note (Signed)
Justin Ayala, Justin Ayala               ACCOUNT NO.:  [id number]   MEDICAL RECORD NO.:  [id number]          PATIENT TYPE:  INP   LOCATION:  2508                         FACILITY:  [facility]   PHYSICIAN:  Jordan Hawks. Elnoria Howard, MD    DATE OF BIRTH:  1951/01/02   DATE OF CONSULTATION:  10/12/2008  DATE OF DISCHARGE:                                 CONSULTATION   REFERRING PHYSICIAN:  Nanetta Batty, MD   REASON FOR CONSULTATION:  Iron-deficiency anemia.   HISTORY OF PRESENT ILLNESS:  This is a 60 year old gentleman with a past  medical history of hypertension, hyperlipidemia, and peptic ulcer  disease in 1989.  He was admitted to the hospital for a cardiac  catheterization because of complaints of dyspnea on exertion and chest  pressure and also abnormalities on his EKG.  The cardiac catheterization  was performed by Dr. Allyson Sabal without any complications and he was noted to  have mild coronary artery disease.  However, he was noted to have  significant anemia with hemoglobin in the 7 range and MCV at 66.  Because of these type of findings, a GI consultation was requested.  He  did undergo colonoscopy by Dr. Arlyce Dice on July 13, 2006, and he was  noted to have diverticulosis, hemorrhoids, and a 3-mm sessile polyp in  the splenic flexure, which was removed without any difficulty.  Overall,  the patient reports that he has been well aside from the chest  discomfort and shortness of breath.  The patient denies taking any  NSAIDs of late aside from the aspirin.  In 1989, the patient did have a  significant upper GI bleed from a peptic ulcer as a result of NSAID use.  Since that time, he has not had any further issues.  He did require an  EGD with cauterization.  Currently, the patient denies having any issues  with hematochezia or melena.  No complaints of any abdominal pain,  nausea, vomiting, or gastroesophageal reflux disease.   PAST MEDICAL HISTORY:  As stated above.   PAST SURGICAL HISTORY:  As  stated above.   FAMILY HISTORY:  Noncontributory.   SOCIAL HISTORY:  Negative for alcohol, tobacco, or illicit drug use.   REVIEW OF SYSTEMS:  As stated above in history of present illness,  otherwise negative.   PHYSICAL EXAMINATION:  VITAL SIGNS:  Stable.  GENERAL:  The patient is in no acute distress, alert, and oriented.  HEENT:  Normocephalic, atraumatic.  Extraocular muscles intact.  NECK:  Supple.  No lymphadenopathy.  LUNGS:  Clear to auscultation bilaterally.  CARDIOVASCULAR:  Regular rate and rhythm.  ABDOMEN:  Flat, soft, nontender, and nondistended.  Positive bowel  sounds.  EXTREMITIES:  No clubbing, cyanosis, or edema.   LABORATORY DATA:  Laboratory values are reviewed and as stated above in  history of present illness.   MEDICATIONS:  1. Hydrochlorothiazide.  2. Lipitor.  3. Baby aspirin.   IMPRESSION:  1. Microcytic anemia.  2. History of peptic ulcer disease.  3. Mild coronary artery disease.   At this time, the patient is hemodynamically stable and is well.  I am  unable to perform a rectal exam because he has just finished coming out  of his cardiac catheterization and his leg needs to be immobilized at  this time, but he denies having any evidence of hematochezia or melena.  Further evaluation is required with an EGD and a repeat colonoscopy  given his history and the current findings of microcytic anemia.  Further recommendations will be made pending the findings.   PLAN:  1. EGD and colonoscopy tomorrow.  2. Agreed with blood transfusions.      Jordan Hawks Elnoria Howard, MD  Electronically Signed     PDH/MEDQ  D:  10/12/2008  T:  10/13/2008  Job:  [id number]   cc:   Nanetta Batty, M.D.

## 2010-10-07 NOTE — Discharge Summary (Signed)
NAMETLALOC, TADDEI               ACCOUNT NO.:  [id number]   MEDICAL RECORD NO.:  [id number]          PATIENT TYPE:  INP   LOCATION:  2508                         FACILITY:  [facility]   PHYSICIAN:  Nanetta Batty, M.D.   DATE OF BIRTH:  11/20/1950   DATE OF ADMISSION:  10/12/2008  DATE OF DISCHARGE:  10/13/2008                               DISCHARGE SUMMARY   REFERRING PHYSICIAN:  Huntley Dec L. Jennette Kettle, MD   DISCHARGE DIAGNOSES:  1. Abnormal stress test with shortness of breath.  2. Significant anemia, possible blood loss anemia.  3. Coronary artery disease, though nonobstructive.  4. Iron-deficiency anemia.  5. Dyslipidemia.   DISCHARGE CONDITION:  Improved.   PROCEDURES:  1. On Oct 12, 2008, combined left heart cath by Dr. Nanetta Batty      with 60-70% diagonal disease, 30 and 50% circumflex disease and      normal left ventricular function.  2. Oct 12, 2008, esophagogastroduodenoscopy by Dr. Lina Sar with      polypectomy, no sign of bleeding at esophagogastroduodenoscopy, a      duodenal polyp was removed.  Discharge condition improved and      stable.  3. Transfusion of 2 units of packed RBCs.   DISCHARGE MEDICATIONS:  1. Aspirin 81 mg daily.  2. HCTZ 25 mg daily.  3. Lipitor 40 mg daily.  4. Vitamin E, B12, palmetto, green tea, and cranberry daily.  5. Fish oil and omega-3 daily.  6. Nasonex nasal spray as needed.  7. Iron, ferrous sulfate 325 mg tablet one 3 times a day.  8. Lopressor 25 mg half a tablet twice a day.  9. Protonix 40 mg daily.   DISCHARGE INSTRUCTIONS:  1. The patient may return to work on Oct 15, 2008.  2. Low-sodium heart-healthy low-fat diet.  3. Wash cath site with soap and water.  Call if any bleeding,      swelling, or drainage.  4. Increase activity slowly.  May shower.  No lifting for 2 days.  No      driving for 2 days.  5. Follow up with Dr. Allyson Sabal in 2 weeks.  The office will call with      date and time.  6. Have blood drawn on Tuesday to  check for hemoglobin/hematocrit and      pick up stool cards to check stool for blood.  7. The patient was explained reason for Lopressor.  In his stress      test, he had increased heart rate initially prior to the test      probably related to anxiety and blood pressure was elevated, but      with nonobstructive disease 60-70%.  The patient does need beta-      blocker and at discharge as an outpatient and ACE inhibitor may be      added as well depending on how he tolerates his beta-blocker and      blood pressure status per Dr. Allyson Sabal.   HISTORY OF PRESENT ILLNESS:  A 60 year old [place] American male, father  of 3, grandfather of 4 grandchildren, works as a  Pensions consultant at [employer].  Dr. Jennette Kettle referred to Dr. Allyson Sabal because of abnormal stress  test.  He does have a history of hypertension and hyperlipidemia.  No  family history of coronary artery disease, but he has had a fairly new  onset of dyspnea on exertion, but no chest pain.  His recent stress test  on Oct 02, 2008, was positive with a 2-mm ST-segment depression.  He was  seen by Dr. Allyson Sabal and plans were for cardiac angiography to determine  anatomy.   On preprocedure labs, his hemoglobin was found to be 7.7 and hematocrit  of 24, and the patient underwent elective cardiac catheterization to  determine if he did have coronary artery disease or not versus his  symptomatology related to anemia only.   Cardiac cath revealed nonobstructive coronary artery disease, though he  does have 60-70% diagonal disease.   At the end of the catheterization, which he tolerated well and had  normal LV function, he received 2 units of packed RBCs.  A GI consult  was obtained with Dr. Lina Sar and with examination and the patient  was scheduled for EGD on Oct 13, 2008, which he underwent.   The upper endoscopy revealed duodenal polyps, which was removed, but no  obvious site of bleeding, and colonoscopy was normal exam.  It was  felt  iron-deficiency anemia, iron was started.  We would need to follow  hemoglobin/hematocrit and stool cards.  We will also need to check iron  level once he has been on the iron for several days.  She recommends if  stool cards are positive, suggest small bowel capsule endoscopy.  Dr.  Allyson Sabal saw the patient after the endoscopy and felt he was stable for  discharge home and we will follow up as an outpatient.      Darcella Gasman. Annie Paras, N.P.      Nanetta Batty, M.D.  Electronically Signed    LRI/MEDQ  D:  10/13/2008  T:  10/13/2008  Job:  [id number]   cc:   Hedwig Morton. Juanda Chance, MD  Nestor Ramp, MD

## 2010-10-26 ENCOUNTER — Other Ambulatory Visit: Payer: Self-pay | Admitting: Family Medicine

## 2010-10-27 NOTE — Telephone Encounter (Signed)
Refill request

## 2010-10-30 ENCOUNTER — Telehealth: Payer: Self-pay | Admitting: Family Medicine

## 2010-10-30 NOTE — Telephone Encounter (Signed)
Pt says his rx for lipitor was denied, he tried scheduling an appt with Dr. Louanne Belton but he is booked until July and that schedule is not ready yet, pt needs a refill to last him until July and he will call next week to schedule appt, pt goes to [address]

## 2010-10-31 NOTE — Telephone Encounter (Signed)
Spoke with patient and advised that I do not see a note that RX was denied by MD  but if that is what the pharmacy is saying I will need to verify with Dr. Louanne Belton  before I can send in enough to last until his appointment. Will forward to Dr. Louanne Belton and need to call patient back to let him know status.

## 2010-10-31 NOTE — Telephone Encounter (Addendum)
Will give one month supply with one refill in order to get patient to his appointment.

## 2010-10-31 NOTE — Telephone Encounter (Signed)
Pt states he is now out of the lipitor and needs enough to last until his appt on July 10th.  pls call to let him know if he can get it. [facility] [address]

## 2010-11-02 MED ORDER — ATORVASTATIN CALCIUM 40 MG PO TABS: 40.0000 mg | ORAL_TABLET | Freq: Every day | ORAL | Status: DC

## 2010-12-02 ENCOUNTER — Encounter: Payer: Self-pay | Admitting: Family Medicine

## 2010-12-02 ENCOUNTER — Ambulatory Visit (INDEPENDENT_AMBULATORY_CARE_PROVIDER_SITE_OTHER): Payer: BC Managed Care – PPO | Admitting: Family Medicine

## 2010-12-02 DIAGNOSIS — E785 Hyperlipidemia, unspecified: Secondary | ICD-10-CM

## 2010-12-02 DIAGNOSIS — Z125 Encounter for screening for malignant neoplasm of prostate: Secondary | ICD-10-CM

## 2010-12-02 DIAGNOSIS — E663 Overweight: Secondary | ICD-10-CM

## 2010-12-02 DIAGNOSIS — D509 Iron deficiency anemia, unspecified: Secondary | ICD-10-CM

## 2010-12-02 DIAGNOSIS — I10 Essential (primary) hypertension: Secondary | ICD-10-CM

## 2010-12-02 MED ORDER — ATORVASTATIN CALCIUM 40 MG PO TABS: 40.0000 mg | ORAL_TABLET | Freq: Every day | ORAL | Status: DC

## 2010-12-02 MED ORDER — PANTOPRAZOLE SODIUM 40 MG PO TBEC: 40.0000 mg | DELAYED_RELEASE_TABLET | Freq: Every day | ORAL | Status: DC

## 2010-12-02 MED ORDER — LISINOPRIL 10 MG PO TABS: 5.0000 mg | ORAL_TABLET | Freq: Every day | ORAL | Status: DC

## 2010-12-02 MED ORDER — METOPROLOL TARTRATE 25 MG PO TABS: 12.5000 mg | ORAL_TABLET | Freq: Two times a day (BID) | ORAL | Status: DC

## 2010-12-02 MED ORDER — MOMETASONE FUROATE 50 MCG/ACT NA SUSP: 2.0000 | NASAL | Status: DC

## 2010-12-02 MED ORDER — FERROUS SULFATE 325 (65 FE) MG PO TABS: 325.0000 mg | ORAL_TABLET | Freq: Every day | ORAL | Status: DC

## 2010-12-02 MED ORDER — EZETIMIBE 10 MG PO TABS: 10.0000 mg | ORAL_TABLET | Freq: Every day | ORAL | Status: DC

## 2010-12-02 MED ORDER — HYDROCHLOROTHIAZIDE 25 MG PO TABS: 25.0000 mg | ORAL_TABLET | Freq: Every day | ORAL | Status: DC

## 2010-12-02 NOTE — Patient Instructions (Signed)
It was great to see you today! I am sending in your refill on your Lipitor and printing out the rest of your prescriptions. Congrats on the weight loss!  Keep up the good work! Come back for your blood work.  You need to have had nothing to eat for eight hours.  Give the clinic a call the day before to make sure you will be able to get it done in the morning.

## 2010-12-02 NOTE — Assessment & Plan Note (Signed)
Blood pressure is at goal today. Will continue patient's current therapy and obtain basic lab work to monitor serum electrolytes and creatinine.

## 2010-12-02 NOTE — Assessment & Plan Note (Signed)
We will obtain a fasting lipid panel at the patient's convenience. He does not currently have any signs or symptoms of statin toxicity. Previously the patient's LDL has been appropriate. Will continue current therapy and titrate based on results. The patient is currently on maximal dose Lipitor and it is LDL is not appropriate, we will have to stressed the importance of dietary modification.

## 2010-12-02 NOTE — Progress Notes (Signed)
Subjective: the patient presents today for routine health maintenance. Other than occasional problems with right-sided low back pain, mostly related to lifting heavy items at work, he does not have any significant complaints. He reports that he takes his medications regularly and has had no significant problems with low blood pressure. He has had no problems with lightheadedness, dizziness, chest pain, shortness of breath, muscle aches, or lower extremity swelling.  Review of systems: as previously noted, otherwise negative.  Past medical, family, and social history reviewed and updated as noted.  Objective: vital signs reviewed, blood pressure is appropriate. General: no acute distress, cooperative and appropriate. Head: eyes have brisk pupillary reflexes, positive red reflexes, and no other acute findings. Cardiovascular: regular rate and rhythm, no murmurs rubs or gallops detected. Respiratory: cleared auscultation bilaterally. Abdomen: soft, nontender, nondistended. Extremities: no edema noted. 2+ pulses.

## 2010-12-02 NOTE — Assessment & Plan Note (Signed)
The patient has lost a small amount of weight since his last visit. The patient was provided with significant encouragement to continue with his good progress. The patient has no questions about this at this time.

## 2010-12-05 ENCOUNTER — Other Ambulatory Visit: Payer: BC Managed Care – PPO

## 2010-12-05 ENCOUNTER — Telehealth: Payer: Self-pay | Admitting: Family Medicine

## 2010-12-05 ENCOUNTER — Telehealth: Payer: Self-pay | Admitting: *Deleted

## 2010-12-05 DIAGNOSIS — E785 Hyperlipidemia, unspecified: Secondary | ICD-10-CM

## 2010-12-05 DIAGNOSIS — Z125 Encounter for screening for malignant neoplasm of prostate: Secondary | ICD-10-CM

## 2010-12-05 LAB — CBC WITH DIFFERENTIAL/PLATELET
Basophils Absolute: 0 10*3/uL (ref 0.0–0.1)
Basophils Relative: 0 % (ref 0–1)
Eosinophils Absolute: 0.5 10*3/uL (ref 0.0–0.7)
Hemoglobin: 13.5 g/dL (ref 13.0–17.0)
MCH: 27.6 pg (ref 26.0–34.0)
MCHC: 32.4 g/dL (ref 30.0–36.0)
Monocytes Relative: 9 % (ref 3–12)
Neutrophils Relative %: 43 % (ref 43–77)
Platelets: 238 10*3/uL (ref 150–400)
RDW: 12.8 % (ref 11.5–15.5)

## 2010-12-05 LAB — BASIC METABOLIC PANEL
BUN: 22 mg/dL (ref 6–23)
CO2: 29 mEq/L (ref 19–32)
Chloride: 101 mEq/L (ref 96–112)
Creat: 1.18 mg/dL (ref 0.50–1.35)
Glucose, Bld: 82 mg/dL (ref 70–99)

## 2010-12-05 LAB — LIPID PANEL
Cholesterol: 128 mg/dL (ref 0–200)
VLDL: 11 mg/dL (ref 0–40)

## 2010-12-05 MED ORDER — PANTOPRAZOLE SODIUM 40 MG PO TBEC: 40.0000 mg | DELAYED_RELEASE_TABLET | Freq: Every day | ORAL | Status: DC

## 2010-12-05 MED ORDER — ATORVASTATIN CALCIUM 40 MG PO TABS: 40.0000 mg | ORAL_TABLET | Freq: Every day | ORAL | Status: DC

## 2010-12-05 NOTE — Telephone Encounter (Signed)
Called patient and informed him of rx up front to be picked up

## 2010-12-05 NOTE — Telephone Encounter (Signed)
error 

## 2010-12-05 NOTE — Telephone Encounter (Signed)
Ready for pick up

## 2010-12-05 NOTE — Progress Notes (Signed)
BMP,CBC,FLP AND PSA Justin Ayala

## 2011-01-05 ENCOUNTER — Telehealth: Payer: Self-pay | Admitting: Family Medicine

## 2011-01-05 DIAGNOSIS — R972 Elevated prostate specific antigen [PSA]: Secondary | ICD-10-CM

## 2011-01-05 NOTE — Telephone Encounter (Signed)
Called pt and informed of slightly elevated PSA.  Will put in order for recheck in the next several weeks.  Instructed pt to call for a lab appointment and avoid intercourse for 2 days prior to lab draw.

## 2011-01-05 NOTE — Telephone Encounter (Signed)
Message copied by [facility] on Mon Jan 05, 2011 10:17 AM ------      Message from: CHAMBLISS, LEE L      Created: Mon Dec 08, 2010 10:47 AM                   ----- Message -----         From: Lab In [place] Interface         Sent: 12/06/2010   8:56 AM           To: Carney Living, MD

## 2011-01-06 ENCOUNTER — Other Ambulatory Visit: Payer: BC Managed Care – PPO

## 2011-01-06 DIAGNOSIS — R972 Elevated prostate specific antigen [PSA]: Secondary | ICD-10-CM

## 2011-01-06 NOTE — Progress Notes (Signed)
psa done today [facility] Justin Ayala

## 2011-01-19 ENCOUNTER — Emergency Department (HOSPITAL_COMMUNITY): Payer: BC Managed Care – PPO

## 2011-01-19 ENCOUNTER — Telehealth: Payer: Self-pay | Admitting: Family Medicine

## 2011-01-19 ENCOUNTER — Observation Stay (HOSPITAL_COMMUNITY)
Admission: EM | Admit: 2011-01-19 | Discharge: 2011-01-20 | Disposition: A | Discharge status: Home / Self Care | Payer: BC Managed Care – PPO | Attending: Surgery | Admitting: Surgery

## 2011-01-19 DIAGNOSIS — Z7982 Long term (current) use of aspirin: Secondary | ICD-10-CM | POA: Insufficient documentation

## 2011-01-19 DIAGNOSIS — I10 Essential (primary) hypertension: Secondary | ICD-10-CM | POA: Insufficient documentation

## 2011-01-19 DIAGNOSIS — E785 Hyperlipidemia, unspecified: Secondary | ICD-10-CM | POA: Insufficient documentation

## 2011-01-19 DIAGNOSIS — K3580 Unspecified acute appendicitis: Secondary | ICD-10-CM

## 2011-01-19 DIAGNOSIS — Z79899 Other long term (current) drug therapy: Secondary | ICD-10-CM | POA: Insufficient documentation

## 2011-01-19 DIAGNOSIS — M545 Low back pain, unspecified: Secondary | ICD-10-CM | POA: Insufficient documentation

## 2011-01-19 DIAGNOSIS — R1031 Right lower quadrant pain: Secondary | ICD-10-CM

## 2011-01-19 LAB — TYPE AND SCREEN: ABO/RH(D): AB POS

## 2011-01-19 LAB — DIFFERENTIAL
Basophils Absolute: 0 10*3/uL (ref 0.0–0.1)
Lymphocytes Relative: 25 % (ref 12–46)
Lymphs Abs: 2.2 10*3/uL (ref 0.7–4.0)
Monocytes Absolute: 0.9 10*3/uL (ref 0.1–1.0)
Monocytes Relative: 11 % (ref 3–12)
Neutro Abs: 5 10*3/uL (ref 1.7–7.7)

## 2011-01-19 LAB — CBC
HCT: 41.7 % (ref 39.0–52.0)
Hemoglobin: 14 g/dL (ref 13.0–17.0)
MCH: 28.2 pg (ref 26.0–34.0)
MCHC: 33.6 g/dL (ref 30.0–36.0)
MCV: 84.1 fL (ref 78.0–100.0)

## 2011-01-19 LAB — BASIC METABOLIC PANEL
CO2: 32 mEq/L (ref 19–32)
Calcium: 9.5 mg/dL (ref 8.4–10.5)
Creatinine, Ser: 1.08 mg/dL (ref 0.50–1.35)
GFR calc non Af Amer: >60 mL/min (ref >60)
Glucose, Bld: 91 mg/dL (ref 70–99)
Sodium: 138 mEq/L (ref 135–145)

## 2011-01-19 NOTE — Telephone Encounter (Deleted)
Message copied by [facility] on Mon Jan 19, 2011  4:30 PM ------      Message from: CHAMBLISS, LEE L      Created: Wed Jan 07, 2011  1:18 PM                   ----- Message -----         From: Lab In [place] Interface         Sent: 01/06/2011  10:47 PM           To: Carney Living, MD

## 2011-01-19 NOTE — Telephone Encounter (Signed)
Attempted to call pt to advise of lab results.  No answer.  Will try again later.

## 2011-01-20 ENCOUNTER — Other Ambulatory Visit (INDEPENDENT_AMBULATORY_CARE_PROVIDER_SITE_OTHER): Payer: Self-pay | Admitting: Surgery

## 2011-01-20 ENCOUNTER — Telehealth: Payer: Self-pay | Admitting: *Deleted

## 2011-01-20 DIAGNOSIS — R972 Elevated prostate specific antigen [PSA]: Secondary | ICD-10-CM

## 2011-01-20 NOTE — Telephone Encounter (Signed)
Received call from Dr.  Providence Lanius of [facility] stating that patient came to them and is currently a patient at [facility] and had appendectomy this AM. CT showed some other things that Dr. Providence Lanius wanted MD aware of that will need follow up. Showed an adrenal nodule on left and mildly enlarged prostate. Phone number [phone] and home [phone]. She will fax CT report. Will forward to Dr. Louanne Belton

## 2011-01-20 NOTE — Telephone Encounter (Signed)
Noted.  Will be happy to see pt when he is out of the hospital.

## 2011-01-21 ENCOUNTER — Telehealth: Payer: Self-pay | Admitting: Family Medicine

## 2011-01-21 NOTE — Telephone Encounter (Signed)
Justin Ayala is returning [name] call.

## 2011-01-21 NOTE — Telephone Encounter (Signed)
Called returned . See previous message .

## 2011-01-21 NOTE — Telephone Encounter (Signed)
Message again left to call back

## 2011-01-21 NOTE — Telephone Encounter (Signed)
Spoke with patient and appointment is scheduled for 02/04/2011 with Dr. Louanne Belton. Patient wants to know about his PSA result from July. Advised will send message to MD to advise.

## 2011-01-22 NOTE — H&P (Signed)
[name], [name] NO.:  [id number]  MEDICAL RECORD NO.:  [id number]  LOCATION:  1529                         FACILITY:  [facility]  PHYSICIAN:  Ardeth Sportsman, MD     DATE OF BIRTH:  1950-10-19  DATE OF ADMISSION:  01/19/2011 DATE OF DISCHARGE:                             HISTORY & PHYSICAL   __________  REQUESTING PHYSICIAN:  Justin Hong, MD.  REASON FOR EVALUATION/CHIEF COMPLAINT:  Right lower quadrant pain, probable appendicitis.  HISTORY OF PRESENT ILLNESS:  Justin Ayala is 60 year old male who has is rather healthy and active, who noticed 4 days ago some pain in his abdomen.  It was little vague, but then became more focused in the right lower quadrant.  He has had some mildly decreased appetite with this, but no nausea or vomiting.  No sick contact or travel history.  He has a history of diverticulosis.  He did have a GI bleed 2 years ago with under whelming EGD and colonoscopy.  He has had polyps in the distant past, but last endoscopy in 2010 showed no major problems.  He has had no bleeding episodes since.  Because the pain persisted, he had an evaluation done over urgent care clinic yesterday.  They recommended CAT scan that was done today.  It was suspicious for appendicitis.  It was recommended that he come into the emergency room.  Dr. Hyacinth Meeker evaluated him when he showed up and request surgical evaluation.  PAST MEDICAL HISTORY: 1. Hypertension. 2. History of hyperlipidemia. 3. GI bleed with uncertain etiology in 2010. 4. Colonoscopy in 2010 within normal limits. 5. Small duodenal polyp on EGD in 2010. 6. Diverticulosis.  PAST SURGICAL HISTORY:  Umbilical hernia repair in the distant past.  MEDICATIONS:  He has been treated on aspirin, Lipitor, and some other medications.  __________.  His family is going to try and pick them up.  ALLERGIES:  He denies.  SOCIAL HISTORY:  No tobacco, alcohol, or drug use.  He works doing moderate  Nurse, adult.  He does have family support.  FAMILY HISTORY:  He cannot recall any history of inflammatory bowel disease or irritable bowel syndrome.  REVIEW OF SYSTEMS:  Noted  above.  GENERAL:  No fevers, chills, or sweats.  No change in his weight.  EYES:  Negative.  ENT:  Negative. CARDIAC/RESPIRATORY:  Negative.  He is not able to walk for a quite a long time__________.  No dyspnea on exertion.  No chest pain.  He had a cardiac catheterization in 2010 in which noncritical coronary artery disease with normal left ventricular function evaluated by Dr. __________ in 2010; also his cardiologist, Dr. Orvan Falconer. MUSCULOSKELETAL/NEUROLOGIC: Negative.  PSYCH:  Negative. HEME/LYMPH/ALLERGIC/ENDOCRINE:  Otherwise negative.  PHYSICAL EXAMINATION:  VITAL SIGNS:  Temperature 98.4, pulse 74, respirations 16, blood pressure 120/78, and 98% saturating in the room air. GENERAL:  He is a well-developed and well-nourished male, sitting comfortably in no acute distress. EYES:  Pupils equal round and reactive to light.  Extraocular movements are intact.  His sclerae nonicteric or injected. NEUROLOGICAL:  Cranial nerves 2 through 12 are intact.  __________ symmetrical.  No obvious focal or sensory motor deficits. NECK:  Supple.  No masses.  Trachea is midline. LYMPH:  No head, neck, axillary or groin lymphadenopathy. HEENT:  Normocephalic.  Mucous membranes are moist.  Nasopharynx and oropharynx clear. CHEST:  No nipple discharge or masses.  No pain on__________ compression. HEART:  Regular rate and rhythm.  No murmurs, rubs, or gallops. ABDOMEN:  Mildly overweight, but soft.  He has a supraumbilical incision, which may be a 2 mm umbilical hernia on Valsalva only.  He has a pain in the right lower quadrant, right over McBurney point.  Mild Rovsing sign.  Negative psoas sign.  No tenderness elsewhere in his abdomen. MUSCULOSKELETAL:  Pretty good range of motion in the shoulders, both  of his wrist as well as ankles. SKIN:  No petechiae or purpura.  No other obvious sores or lesions.  STUDIES:  He has normal white count and a normal differential.  EKG, which showed some mild ST changes, but does not seem different from what was before.  I am trying to get EKG for comparison.  CT scan shows an inflamed tubular segment coming from the cecum consistent with appendicitis that was thickened with some stranding. There was no free air, perforation, or abscess.  No evidence of bowel obstruction or hernia.  He has had some moderate-sized liver cysts.  ASSESSMENT AND PLAN:  Acute appendicitis with piercing pain, although somewhat under whelming history and physical, but the pain right over there with the differential diagnosis, less likely.  1. I think the safe thing to do is a diagnostic laparoscopy with     appendectomy.  The rest of differential seems less likely. The     technique of this was discussed.  Possible conversion of open     discussed.  The risks, benefits, and alternatives were discussed in     detail. Questions answered.  He agrees to proceed. 2. IV antibiotics. 3. Follow with home medications.  He notes he is only on as aspirin as     a blood thinner, none other. 4. DVT prophylaxis.     Ardeth Sportsman, MD    SCG/MEDQ  D:  01/19/2011  T:  01/20/2011  Job:  [id number]  Electronically Signed by Karie Soda MD on 01/22/2011 01:33:34 PM

## 2011-01-22 NOTE — Telephone Encounter (Signed)
Informed that PSA is once more elevated and that this could be related to his prostate enlarging or early prostate cancer.  Will make referral to urology for further evaluation/mnagement.  Also informed patient that often adrenal nodules are not worrisome but that I do not have the full report yet.  Once I have full report will be able to further evaluate any necessary follow up.

## 2011-01-22 NOTE — Telephone Encounter (Signed)
Addended by: Majel Homer D on: 01/22/2011 12:26 PM   Modules accepted: Orders

## 2011-01-22 NOTE — Op Note (Signed)
Justin Ayala, Justin Ayala               ACCOUNT NO.:  [id number]  MEDICAL RECORD NO.:  [id number]  LOCATION:  1529                         FACILITY:  [facility]  PHYSICIAN:  Ardeth Sportsman, MD     DATE OF BIRTH:  April 06, 1951  DATE OF PROCEDURE: DATE OF DISCHARGE:                              OPERATIVE REPORT   PRIMARY CARE PHYSICIAN:  Brett Canales A. Cleta Alberts, M.D. Majel Homer, MD  Earl Lites, M.D. of [facility].  SURGEON:  Ardeth Sportsman, MD.  ASSISTANT:  RN  PREOPERATIVE DIAGNOSIS:  Acute appendicitis.  POSTOPERATIVE DIAGNOSIS:  Probable early acute appendicitis with dilated appendix.  PROCEDURES PERFORMED:  Diagnostic laparoscopy and appendectomy.  ANESTHESIA: 1. General anesthesia. 2. Local anesthetic, blue block around port sites.  SPECIMENS:  Appendix.  DRAINS:  None.  ESTIMATED BLOOD LOSS:  Minimal.  COMPLICATIONS:  None.  INDICATIONS:  Mr. Keaney is a 60 year old male who is functional and rather healthy, who 4 days ago began to have some abdominal pain that came on focal in the right lower quadrant.  It was not that severe, but has been persistent.  Differential diagnosis was otherwise under- whelming.  He had CT scans showing a dilated appendix with some straying and suspicious for appendicitis.  I had a long discussion with the patient, discussed the differential diagnosis.  I discussed the pathophysiology of appendicitis.  I recommended diagnostic laparoscopy with appendectomy.  Risks, benefits, and alternatives were discussed. Possibility of a negative diagnosis was discussed.  Possibility of more serious health risks without surgery were discussed and therefore I recommended surgery.  Questions were answered and he agreed to proceed.  OPERATIVE FINDINGS:  He had a dilated appendix with thickening and suspicious for appendicitis.  He had no Meckel diverticulum.  He had a little laxity in the internal ring of his right inguinal region, but  no classic inguinal hernia.  No other hernias.  Mild adhesions to his prior umbilical incision, but no ventral hernia there.  Gallbladder and other organs looked normal.  No evidence of bowel obstruction.  No evidence of any major peritonitis.  DESCRIPTION OF THE PROCEDURE:  Informed consent was confirmed.  The patient received IV antibiotics.  He underwent general anesthesia without any difficulty.  He was positioned supine with arms tucked.  He had sequential compression devices active during the time of the case. His abdomen and mons pubis were clipped, prepped and draped in sterile fashion.  Surgical time-out was performed to plan.  I placed a #5 mm port through the umbilicus just superior and hidden within his transverse incision that was superiorly curved in the umbilicus.  Entry was clean.  I induced carbon dioxide insufflation. Cam inspection revealed no injury.  I placed a 5 mm port on the left suprapubic region.  I looked up and saw additional omentum at the umbilicus but no true umbilical hernia.  I upsized the umbilical port to a 12 mm port after confirming a dilated appendix.  I placed a 5 mm port in the left mid-abdomen.  I mobilized the cecum and appendix, and took the mesentery in a lateral medial fashion as the appendix was retroileal.  I  found the base of the appendix and made a window between the mesoappendix and the base of the appendix.  It was rather thickened all the way.  I had stapled the appendix off the cecum, taking a healthy cuff of normal-appearing cecum using a laparoscopic stapler.  I partially skeletonized the appendix from mesentery and ligated the appendiceal mesentery/mesoappendix using a 0 PDS Endoloop stitch.  Then I freed the appendix off the ligated mesoappendix using cautery tip scissors.  I placed the appendix inside an Endocatch bag and removed it out the umbilical wound after dilating a balloon to get it out as the appendix was obviously  thickened.  I returned the port and did copious irrigation with 1 L of saline.  I irrigated the pelvis and the right gutter.  Hemostasis was excellent. Clips were intact on the staple line.  I ran the small bowel from the cecum, more proximally up the ligament of Treitz and found no Meckel diverticulum or other abnormalities.  No adhesions or other abnormalities.  There was a little laxity in the right internal ring, but no true inguinal hernia.  I saw no incisional hernias.  I did free the omentum off the umbilicus.  I checked blood sweeping.  Gallbladder and other organs were normal.  He had no evidence of bowel obstruction or other abnormalities, ischemia, or diverticulitis.  I evacuated carbon dioxide via the ports.  I reapproximated the umbilical fascia transversally using interrupted 2-0 Vicryl stitches.  I closed the skin using 4-0 Monocryl stitches.  Sterile dressing was applied.  The patient was extubated and returned to recovery room in stable condition.  I had discussed typical postoperative course with the patient prior to surgery.  I am trying to find the family and discuss with them as well.     Ardeth Sportsman, MD     SCG/MEDQ  D:  01/20/2011  T:  01/20/2011  Job:  [id number]  cc:   Majel Homer, MD  [place] Cleta Alberts, M.D. Fax: [phone]  Electronically Signed by Karie Soda MD on 01/22/2011 01:33:38 PM

## 2011-02-03 ENCOUNTER — Encounter (INDEPENDENT_AMBULATORY_CARE_PROVIDER_SITE_OTHER): Payer: Self-pay | Admitting: General Surgery

## 2011-02-03 ENCOUNTER — Ambulatory Visit (INDEPENDENT_AMBULATORY_CARE_PROVIDER_SITE_OTHER): Payer: BC Managed Care – PPO | Admitting: General Surgery

## 2011-02-03 VITALS — BP 118/76 | HR 72 | Ht 67.0 in | Wt 182.4 lb

## 2011-02-03 DIAGNOSIS — K3580 Unspecified acute appendicitis: Secondary | ICD-10-CM

## 2011-02-03 DIAGNOSIS — Z9889 Other specified postprocedural states: Secondary | ICD-10-CM

## 2011-02-03 NOTE — Patient Instructions (Signed)
Follow up prn

## 2011-02-03 NOTE — Progress Notes (Signed)
Justin Ayala [birth year] [id number] 02/03/2011   History of Present Illness: Justin Ayala is a  60 y.o. male who presents today status post lap appy.  Pathology reveals acute suppurative appendicitis.  The patient is tolerating a regular diet, having normal bowel movements, has good pain control.  He  is back to most normal activities.   Physical Exam: Abd: soft, nontender, active bowel sounds, nondistended.  All incisions are well healed.  Impression: 1.  Acute appendicitis, s/p lap appy  Plan: He  is able to return to normal activities. He  may follow up on a prn basis.

## 2011-02-04 ENCOUNTER — Ambulatory Visit (INDEPENDENT_AMBULATORY_CARE_PROVIDER_SITE_OTHER): Payer: BC Managed Care – PPO | Admitting: Family Medicine

## 2011-02-04 ENCOUNTER — Encounter: Payer: Self-pay | Admitting: Family Medicine

## 2011-02-04 VITALS — BP 118/75 | HR 61 | Temp 98.5°F | Ht 67.0 in | Wt 182.7 lb

## 2011-02-04 DIAGNOSIS — E278 Other specified disorders of adrenal gland: Secondary | ICD-10-CM | POA: Insufficient documentation

## 2011-02-04 NOTE — Patient Instructions (Signed)
It was great to see you today! Please come back for a checkup in 6 months we will schedule your CT then.

## 2011-02-04 NOTE — Progress Notes (Signed)
Subjective: Pt presents today for f/u for appendectomy.  Pt reports that he had a sore spot on the right anterior abd wall.  Went to [facility], had bloodwork and had a CT scan that showed appendicitis.  Went to [facility] and had lap-appy.  Also had an umbilical hernia that was repaired during the surgery.  No current complaints.  Objective:  Filed Vitals:   02/04/11 1111  BP: 118/75  Pulse: 61  Temp: 98.5 F (36.9 C)   Gen: NAD Abd: SNTND, well healed umbilical scar.  No CVA tenderness.  No palpable masses  Assessment/Plan: Please also see individual problems in problem list for problem-specific plans.

## 2011-02-04 NOTE — Assessment & Plan Note (Signed)
Will plan on screening with CT in 6 months and 12 months.  Pt informed of reasons for early return.

## 2011-02-18 ENCOUNTER — Encounter: Payer: Self-pay | Admitting: Family Medicine

## 2011-03-12 ENCOUNTER — Encounter: Payer: Self-pay | Admitting: Family Medicine

## 2011-03-19 HISTORY — PX: BIOPSY PROSTATE: PRO28

## 2011-04-22 ENCOUNTER — Ambulatory Visit: Payer: BC Managed Care – PPO | Admitting: Radiation Oncology

## 2011-04-22 ENCOUNTER — Ambulatory Visit: Payer: BC Managed Care – PPO

## 2011-04-24 ENCOUNTER — Encounter: Payer: Self-pay | Admitting: *Deleted

## 2011-04-24 DIAGNOSIS — I2510 Atherosclerotic heart disease of native coronary artery without angina pectoris: Secondary | ICD-10-CM | POA: Insufficient documentation

## 2011-04-24 DIAGNOSIS — N200 Calculus of kidney: Secondary | ICD-10-CM | POA: Insufficient documentation

## 2011-04-24 NOTE — Progress Notes (Signed)
Married, 1 son, 2 daughters, Pensions consultant

## 2011-04-27 ENCOUNTER — Ambulatory Visit
Admission: RE | Admit: 2011-04-27 | Discharge: 2011-04-27 | Disposition: A | Discharge status: Home / Self Care | Payer: BC Managed Care – PPO | Source: Ambulatory Visit | Attending: Radiation Oncology | Admitting: Radiation Oncology

## 2011-04-27 ENCOUNTER — Encounter: Payer: Self-pay | Admitting: Radiation Oncology

## 2011-04-27 VITALS — BP 120/84 | HR 69 | Temp 98.1°F | Ht 67.0 in | Wt 187.4 lb

## 2011-04-27 DIAGNOSIS — I2510 Atherosclerotic heart disease of native coronary artery without angina pectoris: Secondary | ICD-10-CM | POA: Insufficient documentation

## 2011-04-27 DIAGNOSIS — E785 Hyperlipidemia, unspecified: Secondary | ICD-10-CM | POA: Insufficient documentation

## 2011-04-27 DIAGNOSIS — C61 Malignant neoplasm of prostate: Secondary | ICD-10-CM

## 2011-04-27 DIAGNOSIS — Z79899 Other long term (current) drug therapy: Secondary | ICD-10-CM | POA: Insufficient documentation

## 2011-04-27 DIAGNOSIS — K219 Gastro-esophageal reflux disease without esophagitis: Secondary | ICD-10-CM | POA: Insufficient documentation

## 2011-04-27 DIAGNOSIS — Z7982 Long term (current) use of aspirin: Secondary | ICD-10-CM | POA: Insufficient documentation

## 2011-04-27 DIAGNOSIS — Z8049 Family history of malignant neoplasm of other genital organs: Secondary | ICD-10-CM | POA: Insufficient documentation

## 2011-04-27 DIAGNOSIS — I10 Essential (primary) hypertension: Secondary | ICD-10-CM | POA: Insufficient documentation

## 2011-04-27 DIAGNOSIS — Z8042 Family history of malignant neoplasm of prostate: Secondary | ICD-10-CM | POA: Insufficient documentation

## 2011-04-27 DIAGNOSIS — Z8546 Personal history of malignant neoplasm of prostate: Secondary | ICD-10-CM

## 2011-04-27 HISTORY — DX: Malignant neoplasm of prostate: C61

## 2011-04-27 HISTORY — DX: Anemia, unspecified: D64.9

## 2011-04-27 NOTE — Progress Notes (Signed)
Radiation Oncology         [phone] ________________________________  Initial outpatient Consultation  Name: Justin Ayala MRN: [id number]  Date: 04/27/2011  DOB: 08/15/1950  WU:JWJXB,JYNW, MD, MD  Garnett Farm, MD   REFERRING PHYSICIAN: Garnett Farm, MD  DIAGNOSIS: 60 year old gentleman with stage TI C. adenocarcinoma of the prostate with a Gleason score 3+3 PSA of 5.0  HISTORY OF PRESENT ILLNESS:  Justin Ayala is a very nice 60 year-old gentleman with a family history of prostate cancer. His brother had prostate cancer. He was noted to have an elevated PSA of 4.74 12/05/2002. A short interval followup PSA on August 14 remained elevated at 5.0. Accordingly, patient was referred to urology. A digital rectal exam documented by urology demonstrated no nodules within the prostate gland. He proceeded to transrectal ultrasound and prostate biopsy October 25. The prostate volume was measured to be 34 cubic centimeters. 4/12 core biopsies were positive for adenocarcinoma with a Gleason's score of 3+3. The involvement was 50% along the right lateral apex, 40% of the right apex, 5% left lateral apex, and less than 5% left apex. The patient reviewed the biopsy results with urology and is currently been referred today for discussion of potential radiation treatment options.  PREVIOUS RADIATION THERAPY: No  PAST MEDICAL HISTORY:  has a past medical history of HTN (hypertension); HLD (hyperlipidemia); GERD (gastroesophageal reflux disease); Overweight; Allergic rhinitis; Peptic ulcer; CAD (coronary artery disease); and Kidney stones.    PAST SURGICAL HISTORY: Past Surgical History  Procedure Date  . Cardiac catheterization 2010    Non-critical stenosis  . Appendectomy   . Tonsillectomy   . Biopsy prostate 03/19/11    gleason 3+3=6, volume 34 cc, psa 01/06/11 5.00    FAMILY HISTORY: family history includes Cancer in his brother; Cervical cancer in his mother; Hypertension in his father and  mother; and Stroke in his father.  SOCIAL HISTORY:  reports that he has never smoked. He does not have any smokeless tobacco history on file. He reports that he does not drink alcohol or use illicit drugs.  ALLERGIES: Review of patient's allergies indicates no known allergies.  MEDICATIONS:  Current Outpatient Prescriptions  Medication Sig Dispense Refill  . aspirin EC 81 MG EC tablet Take 81 mg by mouth every morning.        [name] atorvastatin (LIPITOR) 40 MG tablet Take 1 tablet (40 mg total) by mouth daily.  90 tablet  3  . ezetimibe (ZETIA) 10 MG tablet Take 1 tablet (10 mg total) by mouth daily.  90 tablet  3  . ferrous sulfate 325 (65 FE) MG tablet Take 1 tablet (325 mg total) by mouth daily.  90 tablet  3  . hydrochlorothiazide 25 MG tablet Take 1 tablet (25 mg total) by mouth daily.  90 tablet  3  . lisinopril (PRINIVIL,ZESTRIL) 10 MG tablet Take 0.5 tablets (5 mg total) by mouth daily. Take 1/2 tab for high blood pressure  45 tablet  3  . metoprolol tartrate (LOPRESSOR) 25 MG tablet Take 0.5 tablets (12.5 mg total) by mouth 2 (two) times daily. Take 1/2 tab  45 tablet  3  . mometasone (NASONEX) 50 MCG/ACT nasal spray Place 2 sprays into the nose as directed. Once a day  17 g  3  . pantoprazole (PROTONIX) 40 MG tablet Take 1 tablet (40 mg total) by mouth daily.  90 tablet  3    REVIEW OF SYSTEMS:  A 15 point review of systems is documented  in the radiotherapy record. This is essentially noncontributory. The patient developed an IPS S. questionnaire today reporting overall score of 5 suggesting mild urinary outflow start of symptoms. He also filled out an IIEF questionnaire indicating no erectile dysfunction whatsoever.   PHYSICAL EXAM: The patient was in no acute distress today. He is alert and oriented. His vital signs were recorded essentially unremarkable.  LABORATORY DATA:  Lab Results  Component Value Date   WBC 8.5 01/19/2011   HGB 14.0 01/19/2011   HCT 41.7 01/19/2011   MCV 84.1  01/19/2011   PLT 235 01/19/2011   Lab Results  Component Value Date   NA 138 01/19/2011   K 3.7 01/19/2011   CL 98 01/19/2011   CO2 32 01/19/2011   Lab Results  Component Value Date   ALT 28 03/18/2009   AST 26 03/18/2009   ALKPHOS 65 03/18/2009   BILITOT 0.4 03/18/2009   IMPRESSION: Justin Ayala is a very nice 60 year-old gentleman stage TI C. adenocarcinoma prostate with Gleason score 3+3 PSA of 5.0. He falls into a favorable risk group in terms of T. stage, Gleason score, and PSA. He is eligible for a variety of potential treatment approaches including active surveillance, radical prostatectomy, external beam radiotherapy, and prostate brachytherapy.  PLAN:  Today, I reviewed with Justin Ayala his findings and workup thus far. We talked about the role of radiation treatment in the management of prostate cancer. Talked with the natural history of prostate cancer and a variety of potential treatment options ranging from watchful waiting to surgery and radiation therapy. We reviewed the implications of T. stage, Gleason score and PSA on treatment decisions as well as treatment outcomes. We spent more than 50% of our 1 hour visit today inpatient counseling and coordination of care. I follow the patient counseling form for Justin Ayala and retained a copy for our records.  We discussed radiation treatment in the management of prostate cancer with regard to the logistics and delivery of external beam radiation treatment as well as the logistics and delivery of prostate brachytherapy.  We compared and contrasted each of these approaches and also compared these against prostatectomy.  The patient expressed interest in prostate brachytherapy.  I filled out a patient counseling form for him with relevant treatment diagrams and we retained a copy for our records.   The patient would like to proceed with prostate brachytherapy. We will move forward with scheduling the procedure in the near future.     I enjoyed  meeting Justin Ayala today.  I will look forward to participating in the care of this very nice gentleman.   ------------------------------------------------  Artist Pais. Kathrynn Running, M.D.

## 2011-04-27 NOTE — Assessment & Plan Note (Signed)
The patient would like to proceed with prostate brachytherapy, so will move forward with scheduling the procedure in the near future.

## 2011-05-04 NOTE — Telephone Encounter (Signed)
This encounter was created in error - please disregard.

## 2011-05-05 NOTE — Progress Notes (Signed)
Encounter addended by: Tessa Lerner, RN on: 05/05/2011  9:26 AM<BR>     Documentation filed: Charges VN

## 2011-06-16 ENCOUNTER — Encounter: Payer: Self-pay | Admitting: Gastroenterology

## 2011-07-06 ENCOUNTER — Telehealth: Payer: Self-pay | Admitting: *Deleted

## 2011-07-08 ENCOUNTER — Other Ambulatory Visit: Payer: Self-pay | Admitting: Urology

## 2011-07-09 ENCOUNTER — Ambulatory Visit
Admission: RE | Admit: 2011-07-09 | Discharge: 2011-07-09 | Disposition: A | Discharge status: Home / Self Care | Payer: BC Managed Care – PPO | Source: Ambulatory Visit | Attending: Radiation Oncology | Admitting: Radiation Oncology

## 2011-07-09 DIAGNOSIS — C61 Malignant neoplasm of prostate: Secondary | ICD-10-CM

## 2011-07-09 NOTE — Progress Notes (Signed)
  Radiation Oncology         [phone]) [phone] ________________________________  Name: Justin Ayala MRN: [id number]  Date: 07/09/2011  DOB: 08-18-1950  SIMULATION AND TREATMENT PLANNING NOTE PUBIC ARCH STUDY  WJ:XBJYN,WGNF, MD, MD  Garnett Farm, MD  DIAGNOSIS: DIAGNOSIS: 61 year old gentleman with stage TI C. adenocarcinoma of the prostate with a Gleason score 3+3 PSA of 5.0  NARRATIVE:  The patient presented today for evaluation for possible prostate seed implant. He was brought to the radiation planning suite and placed supine on the CT couch. A 3-dimensional image study set was obtained in upload to the planning computer. There, on each axial slice, I contoured the prostate gland. Then, using three-dimensional radiation planning tools I reconstructed the prostate in view of the structures from the transperineal needle pathway to assess for possible pubic arch interference. In doing so, I did not appreciate any pubic arch interference. Also, the patient's prostate volume was estimated based on the drawn structure. The volume was 35 cc.  Given the pubic arch appearance and prostate volume, patient remains a good candidate to proceed with prostate seed implant. Today, he freely provided informed written consent to proceed.    PLAN: The patient will undergo prostate seed implant.   ________________________________  Artist Pais. Kathrynn Running, M.D.

## 2011-07-17 NOTE — Telephone Encounter (Signed)
xxx

## 2011-08-06 ENCOUNTER — Telehealth: Payer: Self-pay | Admitting: *Deleted

## 2011-08-06 NOTE — Telephone Encounter (Signed)
XXXX 

## 2011-08-07 ENCOUNTER — Encounter (HOSPITAL_BASED_OUTPATIENT_CLINIC_OR_DEPARTMENT_OTHER): Payer: Self-pay | Admitting: *Deleted

## 2011-08-07 LAB — COMPREHENSIVE METABOLIC PANEL
ALT: 22 U/L (ref 0–53)
AST: 24 U/L (ref 0–37)
Albumin: 4.3 g/dL (ref 3.5–5.2)
Alkaline Phosphatase: 74 U/L (ref 39–117)
BUN: 17 mg/dL (ref 6–23)
CO2: 34 mEq/L — ABNORMAL HIGH (ref 19–32)
Calcium: 9.7 mg/dL (ref 8.4–10.5)
Chloride: 100 mEq/L (ref 96–112)
Creatinine, Ser: 1.25 mg/dL (ref 0.50–1.35)
GFR calc Af Amer: 70 mL/min — ABNORMAL LOW (ref >90)
GFR calc non Af Amer: 60 mL/min — ABNORMAL LOW (ref >90)
Glucose, Bld: 98 mg/dL (ref 70–99)
Potassium: 3.7 mEq/L (ref 3.5–5.1)
Sodium: 139 mEq/L (ref 135–145)
Total Bilirubin: 0.4 mg/dL (ref 0.3–1.2)
Total Protein: 7.3 g/dL (ref 6.0–8.3)

## 2011-08-07 LAB — CBC
HCT: 42.6 % (ref 39.0–52.0)
Hemoglobin: 14 g/dL (ref 13.0–17.0)
MCH: 27.8 pg (ref 26.0–34.0)
MCHC: 32.9 g/dL (ref 30.0–36.0)
MCV: 84.7 fL (ref 78.0–100.0)
Platelets: 248 10*3/uL (ref 150–400)
RBC: 5.03 MIL/uL (ref 4.22–5.81)
RDW: 12.7 % (ref 11.5–15.5)
WBC: 5.9 10*3/uL (ref 4.0–10.5)

## 2011-08-07 LAB — PROTIME-INR
INR: 0.99 (ref 0.00–1.49)
Prothrombin Time: 13.3 seconds (ref 11.6–15.2)

## 2011-08-07 LAB — APTT: aPTT: 31 seconds (ref 24–37)

## 2011-08-07 NOTE — Progress Notes (Signed)
To [facility] at [facility] with chart,will go to [facility] for lab work today.Npo after MN,to take his metoprolol w/sip water that am,also to use fleets enema prior to arrival that morning.

## 2011-08-13 ENCOUNTER — Telehealth: Payer: Self-pay | Admitting: *Deleted

## 2011-08-13 NOTE — H&P (Signed)
History of Present Illness      Adenocarcinoma the prostate: He underwent TRUS/BX on 03/19/11 do to an elevated PSA. He was found to have an elevated PSA of 5.0 and was noted to have BPH documented on a CT scan. His PSA in 10/10 was 3.9. A PSA was done in 7/12 that was 4.74. His brother was diagnosed with prostate cancer. His DRE was noted to be free of nodularity or induration. His ultrasound revealed a 34 cc prostate.  Pathology: Gleason score 3+3 = 6 in all 4 cores from the apex. Clinical stage T1c.        History of nephrolithiasis: He has a history of kidney stones in the past however a CT scan done in 11/09 revealed no evidence of nephrolithiasis.  Interval history:   Past Medical History Problems  1. History of  Heart Disease 429.9 2. History of  Heartburn 787.1 3. History of  Hypercholesterolemia 272.0 4. History of  Hypertension 401.9 5. History of  Nephrolithiasis V13.01 6. History of  Peptic Ulcer V12.71  Surgical History Problems  1. History of  Appendectomy 2. History of  Biopsy Of The Prostate Needle 3. History of  Tonsillectomy  Current Meds 1. Aspirin 81 MG Oral Tablet; Therapy: (Recorded:06Nov2009) to 2. Cinnamon CAPS; Therapy: (Recorded:06Nov2009) to 3. Cranberry CAPS; Therapy: (Recorded:06Nov2009) to 4. Ferrous Sulfate 325 (65 Fe) MG Oral Tablet; Therapy: (Recorded:02Oct2012) to 5. Flax Seed Oil CAPS; Therapy: (Recorded:02Oct2012) to 6. Ginkgo Biloba TABS; Therapy: (Recorded:02Oct2012) to 7. Ginseng CAPS; Therapy: (Recorded:06Nov2009) to 8. Glucosamine CAPS; Therapy: (Recorded:06Nov2009) to 9. Green Tea CAPS; Therapy: (Recorded:02Oct2012) to 10. Hydrochlorothiazide 25 MG Oral Tablet; Therapy: (Recorded:06Nov2009) to 11. Levofloxacin 500 MG Oral Tablet; 1 po q day beginning the day prior to biopsy; Therapy:   02Oct2012 to (Evaluate:05Oct2012)  Requested for: 02Oct2012; Last Rx:02Oct2012 12. Lipitor 40 MG Oral Tablet; Therapy: (Recorded:06Nov2009) to 13.  Lisinopril 10 MG Oral Tablet; Therapy: 04May2012 to 14. Metoprolol Tartrate 25 MG Oral Tablet; Therapy: 30Jul2012 to 15. Nasonex 50 MCG/ACT Nasal Suspension; Therapy: 30Jul2012 to 16. Pantoprazole Sodium 40 MG Oral Tablet Delayed Release; Therapy: 04May2012 to 17. Saw Palmetto Extract CAPS; Therapy: (Recorded:02Oct2012) to 18. Vitamin B12 TABS; Therapy: (Recorded:06Nov2009) to 19. Vitamin B-6 TABS; Therapy: (Recorded:02Oct2012) to 20. Vitamin D3 TABS; Therapy: (Recorded:02Oct2012) to 21. Vitamin E 400 UNIT Oral Capsule; Therapy: (Recorded:02Oct2012) to 22. Zetia 10 MG Oral Tablet; Therapy: 04May2012 to  Allergies Medication  1. No Known Drug Allergies  Family History Problems  1. Maternal history of  Cancer 2. Paternal history of  Death In The Family Father age 84; heart failure 3. Maternal history of  Death In The Family Mother age 51; cancer 4. Family history of  Family Health Status Number Of Children 1 son; 2 daughters 5. Paternal history of  Stroke Syndrome V17.1  Social History Problems  1. Caffeine Use 3 a day 2. Marital History - Currently Married 3. Never A Smoker 4. 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 History of  Alcohol Use 6. History of  Tobacco Use   Review of Systems Genitourinary, constitutional, skin, eye, otolaryngeal, hematologic/lymphatic, cardiovascular, pulmonary, endocrine, musculoskeletal, gastrointestinal, neurological and psychiatric system(s) were reviewed and pertinent findings if present are noted.  Constitutional: recent weight loss.    Vitals Vital Signs  BMI Calculated: 29.19 BSA Calculated: 1.96 Height: 5 ft 7 in Weight: 186 lb  Blood Pressure: 118 / 74 Temperature: 97.4 F Heart Rate: 62  Physical Exam Constitutional: Well nourished and well developed . No acute distress.  ENT:. The  ears and nose are normal in appearance.  Neck: The appearance of the neck is normal and no neck mass is present.  Pulmonary: No respiratory distress  and normal respiratory rhythm and effort.  Cardiovascular: Heart rate and rhythm are normal . No peripheral edema.  Abdomen: The abdomen is soft and nontender. No masses are palpated. No CVA tenderness. No hernias are palpable. No hepatosplenomegaly noted.  Rectal: Rectal exam demonstrates normal sphincter tone, no tenderness and no masses. Estimated prostate size is 1+. The prostate has no nodularity and is not tender. The left seminal vesicle is nonpalpable. The right seminal vesicle is nonpalpable. The perineum is normal on inspection.  Genitourinary: Examination of the penis demonstrates no discharge, no masses, no lesions and a normal meatus. The penis is circumcised. The scrotum is without lesions. The right epididymis is palpably normal and non-tender. The left epididymis is palpably normal and non-tender. The right testis is non-tender and without masses. The left testis is non-tender and without masses.  Lymphatics: The femoral and inguinal nodes are not enlarged or tender.  Skin: Normal skin turgor, no visible rash and no visible skin lesions.  Neuro/Psych:. Mood and affect are appropriate.    The following clinical lab reports were reviewed:  the pathology report as below.   Partin table results: His probability of indolent cancer is 23% with a 90% probability of organ confined disease, a 7% probability of extracapsular extension, a 1% probability of seminal vesicle involvement and a 1.3% probability of lymph node involvement. His 5 year progression free probability with brachytherapy would be 92% and his 5 and 10 year disease free probability with radical prostatectomy would be 98% and 97% respectively.     Assessment Assessed  1. Adenocarcinoma Of The Prostate Gland 185    I went over his pathology report with him today including the significance of his Gleason score and the number of cores positive. We discussed his Partin table results in detail. I then discussed with him briefly the  various treatment options available. I told him that I wanted to give him information on all of these options including their potential risks, complications and benefits.   Plan  Discussion/Summary    .  The patient was counseled about the natural history of prostate cancer and the standard treatment options that are available for prostate cancer. It was explained to him how his age and life expectancy, clinical stage, Gleason score, and PSA affect his prognosis, the decision to proceed with additional staging studies, as well as how that information influences recommended treatment strategies. We discussed the roles for active surveillance, radiation therapy, surgical therapy, androgen deprivation, as well as ablative therapy options for the treatment of prostate cancer as appropriate to his individual cancer situation. We discussed the risks and benefits of these options with regard to their impact on cancer control and also in terms of potential adverse events, complications, and impact on quiality of life particularly related to urinary, bowel, and sexual function. The patient was encouraged to ask questions throughout the discussion today and all questions were answered to his stated satisfaction. In addition, the patient was provided with and/or directed to appropriate resources and literature for further education about prostate cancer and treatment options.   45 minutes were spent in face to face consultation with patient today.   After discussing the treatment options he told me that he had considered this as a possibility and had discussed with his brother who has had seeds. He also is  looking to surgery and he is made a decision to proceed with radioactive seed implantation.

## 2011-08-13 NOTE — Telephone Encounter (Signed)
XXXX 

## 2011-08-14 ENCOUNTER — Encounter (HOSPITAL_BASED_OUTPATIENT_CLINIC_OR_DEPARTMENT_OTHER): Payer: Self-pay | Admitting: Anesthesiology

## 2011-08-14 ENCOUNTER — Encounter (HOSPITAL_BASED_OUTPATIENT_CLINIC_OR_DEPARTMENT_OTHER): Payer: Self-pay | Admitting: *Deleted

## 2011-08-14 ENCOUNTER — Encounter (HOSPITAL_BASED_OUTPATIENT_CLINIC_OR_DEPARTMENT_OTHER)
Admission: RE | Disposition: A | Discharge status: Home / Self Care | Payer: Self-pay | Source: Ambulatory Visit | Attending: Urology

## 2011-08-14 ENCOUNTER — Ambulatory Visit (HOSPITAL_COMMUNITY): Payer: BC Managed Care – PPO

## 2011-08-14 ENCOUNTER — Ambulatory Visit (HOSPITAL_BASED_OUTPATIENT_CLINIC_OR_DEPARTMENT_OTHER): Payer: BC Managed Care – PPO | Admitting: Anesthesiology

## 2011-08-14 ENCOUNTER — Ambulatory Visit (HOSPITAL_BASED_OUTPATIENT_CLINIC_OR_DEPARTMENT_OTHER)
Admission: RE | Admit: 2011-08-14 | Discharge: 2011-08-14 | Disposition: A | Discharge status: Home / Self Care | Payer: BC Managed Care – PPO | Source: Ambulatory Visit | Attending: Urology | Admitting: Urology

## 2011-08-14 DIAGNOSIS — I10 Essential (primary) hypertension: Secondary | ICD-10-CM | POA: Insufficient documentation

## 2011-08-14 DIAGNOSIS — Z79899 Other long term (current) drug therapy: Secondary | ICD-10-CM | POA: Insufficient documentation

## 2011-08-14 DIAGNOSIS — I519 Heart disease, unspecified: Secondary | ICD-10-CM | POA: Insufficient documentation

## 2011-08-14 DIAGNOSIS — E7800 Pure hypercholesterolemia, unspecified: Secondary | ICD-10-CM | POA: Insufficient documentation

## 2011-08-14 DIAGNOSIS — Z7982 Long term (current) use of aspirin: Secondary | ICD-10-CM | POA: Insufficient documentation

## 2011-08-14 DIAGNOSIS — C61 Malignant neoplasm of prostate: Secondary | ICD-10-CM

## 2011-08-14 HISTORY — PX: RADIOACTIVE SEED IMPLANT: SHX5150

## 2011-08-14 SURGERY — INSERTION, RADIATION SOURCE, PROSTATE
Anesthesia: General | Site: Prostate | Wound class: Clean Contaminated

## 2011-08-14 MED ORDER — CIPROFLOXACIN IN D5W 400 MG/200ML IV SOLN
400.0000 mg | INTRAVENOUS | Status: AC
  Administered 2011-08-14: 400 mg via INTRAVENOUS

## 2011-08-14 MED ORDER — FENTANYL CITRATE 0.05 MG/ML IJ SOLN
INTRAMUSCULAR | Status: DC | PRN
  Administered 2011-08-14 (×4): 25 ug via INTRAVENOUS
  Administered 2011-08-14: 50 ug via INTRAVENOUS
  Administered 2011-08-14 (×2): 25 ug via INTRAVENOUS
  Administered 2011-08-14: 50 ug via INTRAVENOUS
  Administered 2011-08-14 (×3): 25 ug via INTRAVENOUS

## 2011-08-14 MED ORDER — LIDOCAINE HCL (CARDIAC) 20 MG/ML IV SOLN
INTRAVENOUS | Status: DC | PRN
  Administered 2011-08-14: 80 mg via INTRAVENOUS

## 2011-08-14 MED ORDER — STERILE WATER FOR IRRIGATION IR SOLN
Status: DC | PRN
  Administered 2011-08-14: 3000 mL

## 2011-08-14 MED ORDER — CIPROFLOXACIN HCL 500 MG PO TABS: 500.0000 mg | ORAL_TABLET | Freq: Two times a day (BID) | ORAL | Status: AC

## 2011-08-14 MED ORDER — IOHEXOL 350 MG/ML SOLN
INTRAVENOUS | Status: DC | PRN
  Administered 2011-08-14: 7 mL via INTRAVENOUS

## 2011-08-14 MED ORDER — PROPOFOL 10 MG/ML IV EMUL
INTRAVENOUS | Status: DC | PRN
  Administered 2011-08-14: 250 mg via INTRAVENOUS

## 2011-08-14 MED ORDER — STERILE WATER FOR IRRIGATION IR SOLN
Status: DC | PRN
  Administered 2011-08-14: 500 mL

## 2011-08-14 MED ORDER — DEXAMETHASONE SODIUM PHOSPHATE 4 MG/ML IJ SOLN
INTRAMUSCULAR | Status: DC | PRN
  Administered 2011-08-14: 10 mg via INTRAVENOUS

## 2011-08-14 MED ORDER — LACTATED RINGERS IV SOLN
INTRAVENOUS | Status: DC
  Administered 2011-08-14 (×2): via INTRAVENOUS

## 2011-08-14 MED ORDER — FLEET ENEMA 7-19 GM/118ML RE ENEM: 1.0000 | ENEMA | Freq: Once | RECTAL | Status: DC

## 2011-08-14 MED ORDER — HYDROCODONE-ACETAMINOPHEN 10-300 MG PO TABS: 1.0000 | ORAL_TABLET | Freq: Four times a day (QID) | ORAL | Status: DC | PRN

## 2011-08-14 MED ORDER — FENTANYL CITRATE 0.05 MG/ML IJ SOLN: 25.0000 ug | INTRAMUSCULAR | Status: DC | PRN

## 2011-08-14 MED ORDER — MIDAZOLAM HCL 5 MG/5ML IJ SOLN
INTRAMUSCULAR | Status: DC | PRN
  Administered 2011-08-14: 1 mg via INTRAVENOUS

## 2011-08-14 SURGICAL SUPPLY — 30 items
BAG URINE DRAINAGE (UROLOGICAL SUPPLIES) ×3 IMPLANT
BLADE SURG ROTATE 9660 (MISCELLANEOUS) ×3 IMPLANT
CATH FOLEY 2WAY SLVR  5CC 16FR (CATHETERS) ×2
CATH FOLEY 2WAY SLVR 5CC 16FR (CATHETERS) ×4 IMPLANT
CATH ROBINSON RED A/P 20FR (CATHETERS) ×3 IMPLANT
CLOTH BEACON ORANGE TIMEOUT ST (SAFETY) ×3 IMPLANT
COVER MAYO STAND STRL (DRAPES) ×3 IMPLANT
COVER TABLE BACK 60X90 (DRAPES) ×3 IMPLANT
DRSG TEGADERM 4X4.75 (GAUZE/BANDAGES/DRESSINGS) ×3 IMPLANT
DRSG TEGADERM 8X12 (GAUZE/BANDAGES/DRESSINGS) ×3 IMPLANT
GLOVE BIO SURGEON STRL SZ7.5 (GLOVE) IMPLANT
GLOVE BIO SURGEON STRL SZ8 (GLOVE) ×6 IMPLANT
GLOVE BIOGEL PI IND STRL 6.5 (GLOVE) IMPLANT
GLOVE BIOGEL PI INDICATOR 6.5 (GLOVE) ×2
GLOVE ECLIPSE 8.0 STRL XLNG CF (GLOVE) ×4 IMPLANT
GLOVE INDICATOR 7.5 STRL GRN (GLOVE) ×1 IMPLANT
GLOVE SKINSENSE NS SZ7.0 (GLOVE) ×1
GLOVE SKINSENSE STRL SZ7.0 (GLOVE) IMPLANT
GOWN ISOL BLUE XXL (GOWNS) ×1 IMPLANT
GOWN STRL REIN XL XLG (GOWN DISPOSABLE) ×4 IMPLANT
HOLDER FOLEY CATH W/STRAP (MISCELLANEOUS) ×3 IMPLANT
IV NS IRRIG 3000ML ARTHROMATIC (IV SOLUTION) IMPLANT
NUCLETRON SELECTSEED ×1 IMPLANT
PACK CYSTOSCOPY (CUSTOM PROCEDURE TRAY) ×3 IMPLANT
SPONGE GAUZE 4X4 12PLY (GAUZE/BANDAGES/DRESSINGS) ×1 IMPLANT
SYRINGE 10CC LL (SYRINGE) ×3 IMPLANT
TOWEL NATURAL 6PK STERILE (DISPOSABLE) ×1 IMPLANT
UNDERPAD 30X30 INCONTINENT (UNDERPADS AND DIAPERS) ×6 IMPLANT
WATER STERILE IRR 3000ML UROMA (IV SOLUTION) ×1 IMPLANT
WATER STERILE IRR 500ML POUR (IV SOLUTION) ×3 IMPLANT

## 2011-08-14 NOTE — Anesthesia Postprocedure Evaluation (Signed)
  Anesthesia Post-op Note  Patient: Justin Ayala  Procedure(s) Performed: Procedure(s) (LRB): RADIOACTIVE SEED IMPLANT (N/A) CYSTO (N/A)  Patient Location: PACU  Anesthesia Type: General  Level of Consciousness: oriented and sedated  Airway and Oxygen Therapy: Patient Spontanous Breathing  Post-op Pain: mild  Post-op Assessment: Post-op Vital signs reviewed, Patient's Cardiovascular Status Stable, Respiratory Function Stable and Patent Airway  Post-op Vital Signs: stable  Complications: No apparent anesthesia complications

## 2011-08-14 NOTE — Anesthesia Procedure Notes (Signed)
Procedure Name: LMA Insertion Date/Time: 08/14/2011 9:34 AM Performed by: Fran Lowes Pre-anesthesia Checklist: Patient identified, Emergency Drugs available, Suction available and Patient being monitored Patient Re-evaluated:Patient Re-evaluated prior to inductionOxygen Delivery Method: Circle System Utilized Preoxygenation: Pre-oxygenation with 100% oxygen Intubation Type: IV induction Ventilation: Mask ventilation without difficulty LMA: LMA inserted LMA Size: 5.0 Number of attempts: 1 Airway Equipment and Method: bite block Placement Confirmation: positive ETCO2 Tube secured with: Tape Dental Injury: Teeth and Oropharynx as per pre-operative assessment  Comments: LMA inserted by Dr. Shireen Quan.

## 2011-08-14 NOTE — Progress Notes (Signed)
  Radiation Oncology         [phone]) [phone] ________________________________  Name: Townsend Cudworth MRN: [id number]  Date: 08/14/2011  DOB: [birth year]       Prostate Seed Implant  XL:KGMWN,UUVO, MD, MD    DIAGNOSIS: A 61 year old gentlemen with stage T1c adenocarcinoma of the prostate with a Gleason of 3+3 and a PSA of 5.0.  PROCEDURE: Insertion of radioactive I-125 seeds into the prostate gland.  RADIATION DOSE: 145 Gy, definitive therapy.  TECHNIQUE: Bearl Talarico was brought to the operating room with Dr. Ihor Gully. He was placed in the dorsolithotomy position. He was catheterized and a rectal tube was inserted. The perineum was shaved, prepped and draped. The ultrasound probe was then introduced into the rectum to see the prostate gland.  TREATMENT DEVICE: A needle grid was attached to the ultrasound probe stand and anchor needles were placed.  COMPLEX ISODOSE CALCULATION: The prostate was imaged in 3D using a sagittal sweep of the prostate probe. These images were transferred to the planning computer. There, the prostate, urethra and rectum were defined on each axial reconstructed image. Then, the software created an optimized plan and a few seed positions were adjusted. Then the accepted plan was uploaded to the seed Selectron afterloading unit.  SPECIAL TREATMENT PROCEDURE/SUPERVISION AND HANDLING: The Nucletron FIRST system was used to place the needles under sagittal guidance. A total of 25 needles were used to deposit 84 seeds in the prostate gland. The individual seed activity was 0.422 mCi for a total implant activity of 35.448 mCi.  COMPLEX SIMULATION: At the end of the procedure, an anterior radiograph of the pelvis was obtained to document seed positioning and count. Cystoscopy was performed to check the urethra and bladder.  MICRODOSIMETRY: At the end of the procedure, the patient was emitting 0.336 mrem/hr at 1 meter. Accordingly, he was considered safe for hospital  discharge.  PLAN: The patient will return to the radiation oncology clinic for post implant CT dosimetry in three weeks.   ________________________________  Artist Pais Kathrynn Running, M.D.

## 2011-08-14 NOTE — Transfer of Care (Signed)
Immediate Anesthesia Transfer of Care Note  Patient: Justin Ayala  Procedure(s) Performed: Procedure(s) (LRB): RADIOACTIVE SEED IMPLANT (N/A) CYSTO (N/A)  Patient Location: Patient transported to PACU with oxygen via face mask at 4 Liters / Min  Anesthesia Type: General  Level of Consciousness: awake and alert   Airway & Oxygen Therapy: Patient Spontanous Breathing and Patient connected to face mask oxygen  Post-op Assessment: Report given to PACU RN and Post -op Vital signs reviewed and stable  Post vital signs: Reviewed and stable  Dentition: Teeth and oropharynx remain in pre-op condition  Complications: No apparent anesthesia complications   Patient woke up rubbing his eyes

## 2011-08-14 NOTE — Anesthesia Preprocedure Evaluation (Signed)
Anesthesia Evaluation  Patient identified by MRN, date of birth, ID band Patient awake    Reviewed: Allergy & Precautions, H&P , NPO status , Patient's Chart, lab work & pertinent test results, reviewed documented beta blocker date and time   Airway Mallampati: II TM Distance: >3 FB Neck ROM: Full    Dental  (+) Partial Upper, Partial Lower and Teeth Intact   Pulmonary neg pulmonary ROS,  breath sounds clear to auscultation        Cardiovascular hypertension, Pt. on medications Rhythm:Regular Rate:Normal  CAD, 2009 heart cath, non-obstructive CAD, medical management Currently asymptomatic   Neuro/Psych negative neurological ROS  negative psych ROS   GI/Hepatic negative GI ROS, Neg liver ROS,   Endo/Other  negative endocrine ROS  Renal/GU negative Renal ROS   Prostate Cancer    Musculoskeletal negative musculoskeletal ROS (+)   Abdominal   Peds negative pediatric ROS (+)  Hematology negative hematology ROS (+)   Anesthesia Other Findings   Reproductive/Obstetrics negative OB ROS                           Anesthesia Physical Anesthesia Plan  ASA: II  Anesthesia Plan: General   Post-op Pain Management:    Induction: Intravenous  Airway Management Planned: LMA  Additional Equipment:   Intra-op Plan:   Post-operative Plan: Extubation in OR  Informed Consent: I have reviewed the patients History and Physical, chart, labs and discussed the procedure including the risks, benefits and alternatives for the proposed anesthesia with the patient or authorized representative who has indicated his/her understanding and acceptance.   Dental advisory given  Plan Discussed with: CRNA and Surgeon  Anesthesia Plan Comments:         Anesthesia Quick Evaluation

## 2011-08-14 NOTE — Discharge Instructions (Signed)
DISCHARGE INSTRUCTIONS FOR PROSTATE SEED IMPLANTATION  Removal of catheter Remove the foley catheter after 24 hours ( day after the procedure).can be done easily by cutting the side port of the catheter, whichallow the balloon to deflate.  You will see 1-2 teaspoons of clear water as the balloon deflates and then the catheter can be slid out without difficulty.        Cut here  Antibiotics You may be given a prescription for an antibiotic to take when you arrive home. If so, be sure to take every tablet in the bottle, even if you are feeling better before the prescription is finished. If you begin itching, notice a rash or start to swell on your trunk, arms, legs and/or throat, immediately stop taking the antibiotic and call your Urologist. Diet Resume your usual diet when you return home. To keep your bowels moving easily and softly, drink prune, apple and cranberry juice at room temperature. You may also take a stool softener, such as Colace, which is available without prescription at local pharmacies. Daily activities   No driving or heavy lifting for at least two days after the implant.   No bike riding, horseback riding or riding lawn mowers for the first month after the implant.   Any strenuous physical activity should be approved by your doctor before you resume it. Sexual relations You may resume sexual relations two weeks after the procedure. A condom should be used for the first two weeks. Your semen may be dark brown or black; this is normal and is related bleeding that may have occurred during the implant. Postoperative swelling Expect swelling and bruising of the scrotum and perineum (the area between the scrotum and anus). Both the swelling and the bruising should resolve in l or 2 weeks. Ice packs and over- the-counter medications such as Tylenol, Advil or Aleve may lessen your discomfort. Postoperative urination Most men experience burning on urination and/or urinary frequency.  If this becomes bothersome, contact your Urologist.  Medication can be prescribed to relieve these problems.  It is normal to have some blood in your urine for a few days after the implant. Special instructions related to the seeds It is unlikely that you will pass an Iodine-125 seed in your urine. The seeds are silver in color and are about as large as a grain of rice. If you pass a seed, do not handle it with your fingers. Use a spoon to place it in an envelope or jar in place this in base occluded area such as the garage or basement for return to the radiation clinic at your convenience.  Contact your doctor for   Temperature greater than 101 F   Increasing pain   Inability to urinate Follow-up  You should have follow up with your urologist and radiation oncologist about 3 weeks after the procedure. General information regarding Iodine seeds   Iodine-125 is a low energy radioactive material. It is not deeply penetrating and loses energy at short distances. Your prostate will absorb the radiation. Objects that are touched or used by the patient do not become radioactive.   Body wastes (urine and stool) or body fluids (saliva, tears, semen or blood) are not radioactive.   The Nuclear Regulatory Commission (NRC) has determined that no radiation precautions are needed for patients undergoing Iodine-125 seed implantation. The NRC states that such patients do not present a risk to the people around them, including young children and pregnant women. However, in keeping with the general principle   that radiation exposure should be kept as low reasonably possible, we suggest the following:   Children and pets should not sit on the patient's lap for the first two (2) weeks after the implant.   Pregnant (or possibly pregnant) women should avoid prolonged, close contact with the patient for the first two (2) weeks after the implant.   A distance of three (3) feet is acceptable.   At a distance of three (3)  feet, there is no limit to the length of time anyone can be with the patient.     

## 2011-08-14 NOTE — Op Note (Signed)
PATIENT:  Justin Ayala  PRE-OPERATIVE DIAGNOSIS:  Adenocarcinoma of the prostate  POST-OPERATIVE DIAGNOSIS:  Same  PROCEDURE:  Procedure(s): 1. I-125 radioactive seed implantation 2. Cystoscopy  SURGEON:  Surgeon(s): Garnett Farm  Radiation oncologist: Dr. Margaretmary Dys  ANESTHESIA:  General  EBL:  Minimal  DRAINS: 16 French Foley catheter  INDICATION: Justin Ayala  Description of procedure: After informed consent the patient was brought to the major OR, placed on the table and administered general anesthesia. He was then moved to the modified lithotomy position with his perineum perpendicular to the floor. His perineum and genitalia were then sterilely prepped. An official timeout was then performed. A 16 French Foley catheter was then placed in the bladder and filled with dilute contrast, a rectal tube was placed in the rectum and the transrectal ultrasound probe was placed in the rectum and affixed to the stand. He was then sterilely draped.  Real time ultrasonography was used along with the seed planning software spot-pro version 3.1-00. This was used to develop the seed plan including the number of needles as well as number of seeds required for complete and adequate coverage. Real-time ultrasonography was then used along with the previously developed plan and the Nucletron device to implant a total of 84 seeds using 25 needles. This proceeded without difficulty or complication.  A Foley catheter was then removed as well as the transrectal ultrasound probe and rectal probe. Flexible cystoscopy was then performed using the 17 French flexible scope which revealed a normal urethra throughout its length down to the sphincter which appeared intact. The prostatic urethra revealed bilobar hypertrophy but no evidence of obstruction, seeds, spacers or lesions. The bladder was then entered and fully and systematically inspected. The ureteral orifices were noted to be of normal  configuration and position. The mucosa revealed no evidence of tumors. There were also no stones identified within the bladder. I noted no seeds or spacers on the floor of the bladder and retroflexion of the scope revealed no seeds protruding from the base of the prostate.  The cystoscope was then removed and a new 16 French Foley catheter was then inserted and the balloon was filled with 10 cc of sterile water. This was connected to closed system drainage and the patient was awakened and taken to recovery room in stable and satisfactory condition. He tolerated procedure well and there were no intraoperative complications.

## 2011-08-14 NOTE — Interval H&P Note (Signed)
History and Physical Interval Note:  08/14/2011 8:56 AM  Justin Ayala  has presented today for surgery, with the diagnosis of PROSTATE CANCER  The various methods of treatment have been discussed with the patient and family. After consideration of risks, benefits and other options for treatment, the patient has consented to  Procedure(s) (LRB): RADIOACTIVE SEED IMPLANT (N/A) as a surgical intervention .  The patients' history has been reviewed, patient examined, no change in status, stable for surgery.  I have reviewed the patients' chart and labs.  Questions were answered to the patient's satisfaction.     Garnett Farm

## 2011-08-17 ENCOUNTER — Encounter (HOSPITAL_BASED_OUTPATIENT_CLINIC_OR_DEPARTMENT_OTHER): Payer: Self-pay | Admitting: Urology

## 2011-09-01 ENCOUNTER — Other Ambulatory Visit: Payer: Self-pay | Admitting: Family Medicine

## 2011-09-01 MED ORDER — METOPROLOL TARTRATE 25 MG PO TABS: 12.5000 mg | ORAL_TABLET | Freq: Two times a day (BID) | ORAL | Status: DC

## 2011-09-03 ENCOUNTER — Telehealth: Payer: Self-pay | Admitting: *Deleted

## 2011-09-03 NOTE — Telephone Encounter (Signed)
CALLED PATIENT TO REMIND OF APPTS. FOR 09-04-11, CONFIRMED APPTS. W/PATIENT 

## 2011-09-04 ENCOUNTER — Encounter: Payer: Self-pay | Admitting: Radiation Oncology

## 2011-09-04 ENCOUNTER — Ambulatory Visit
Admission: RE | Admit: 2011-09-04 | Discharge: 2011-09-04 | Disposition: A | Discharge status: Home / Self Care | Payer: BC Managed Care – PPO | Source: Ambulatory Visit | Attending: Radiation Oncology | Admitting: Radiation Oncology

## 2011-09-04 VITALS — BP 123/79 | HR 68 | Temp 97.8°F | Resp 20 | Wt 190.5 lb

## 2011-09-04 DIAGNOSIS — Z79899 Other long term (current) drug therapy: Secondary | ICD-10-CM | POA: Insufficient documentation

## 2011-09-04 DIAGNOSIS — E785 Hyperlipidemia, unspecified: Secondary | ICD-10-CM | POA: Insufficient documentation

## 2011-09-04 DIAGNOSIS — Z8042 Family history of malignant neoplasm of prostate: Secondary | ICD-10-CM | POA: Insufficient documentation

## 2011-09-04 DIAGNOSIS — I10 Essential (primary) hypertension: Secondary | ICD-10-CM | POA: Insufficient documentation

## 2011-09-04 DIAGNOSIS — Z7982 Long term (current) use of aspirin: Secondary | ICD-10-CM | POA: Insufficient documentation

## 2011-09-04 DIAGNOSIS — C61 Malignant neoplasm of prostate: Secondary | ICD-10-CM | POA: Insufficient documentation

## 2011-09-04 DIAGNOSIS — Z8049 Family history of malignant neoplasm of other genital organs: Secondary | ICD-10-CM | POA: Insufficient documentation

## 2011-09-04 DIAGNOSIS — K219 Gastro-esophageal reflux disease without esophagitis: Secondary | ICD-10-CM | POA: Insufficient documentation

## 2011-09-04 DIAGNOSIS — I2510 Atherosclerotic heart disease of native coronary artery without angina pectoris: Secondary | ICD-10-CM | POA: Insufficient documentation

## 2011-09-04 NOTE — Progress Notes (Signed)
F/u post seed prostate radiation  seed implant=25 needles to deposit 85 seeds in prostate gland, with Dr. Vernie Ammons Alert oriented x 3, steady gait, no dysuria, does have frequency and urgency, "But when you get there sensation but nothings coming, waits 3-4 seconds and gets weak stream" I just wait and let it go ,not in a hurry"bowel movements  Ok goes 1-2 x daily stated 11:57 AM

## 2011-09-06 ENCOUNTER — Encounter: Payer: Self-pay | Admitting: Radiation Oncology

## 2011-09-06 NOTE — Progress Notes (Signed)
Radiation Oncology         [phone]) [phone] ________________________________  Name: Evaristo Tsuda MRN: [id number]  Date: 09/04/2011  DOB: 10-13-1950  Follow-Up Visit Note  CC: Majel Homer, MD, MD  Garnett Farm, MD  Diagnosis:   A 61 year old gentlemen with stage T1c adenocarcinoma of the prostate with a Gleason of 3+3 and a PSA of 5.0. 3 Interval Since Last Radiation:  3 weeks  Narrative:  The patient returns today for routine follow-up.  He is complaining of increased urinary frequency and urinary hesitation symptoms. He filled out a questionnaire regarding urinary function today providing and overall IPSS score of 17 characterizing his symptoms as moderate.  His pre-implant score was 5. He denies any bowel symptoms.  ALLERGIES:   has no known allergies.  Meds: Current Outpatient Prescriptions  Medication Sig Dispense Refill  . aspirin EC 81 MG EC tablet Take 81 mg by mouth every morning.        [name] atorvastatin (LIPITOR) 40 MG tablet Take 1 tablet (40 mg total) by mouth daily.  90 tablet  3  . ezetimibe (ZETIA) 10 MG tablet Take 1 tablet (10 mg total) by mouth daily.  90 tablet  3  . ferrous sulfate 325 (65 FE) MG tablet Take 1 tablet (325 mg total) by mouth daily.  90 tablet  3  . fish oil-omega-3 fatty acids 1000 MG capsule Take 1 g by mouth daily.        . hydrochlorothiazide 25 MG tablet Take 1 tablet (25 mg total) by mouth daily.  90 tablet  3  . lisinopril (PRINIVIL,ZESTRIL) 10 MG tablet Take 0.5 tablets (5 mg total) by mouth daily. Take 1/2 tab for high blood pressure  45 tablet  3  . metoprolol tartrate (LOPRESSOR) 25 MG tablet Take 0.5 tablets (12.5 mg total) by mouth 2 (two) times daily. Take 1/2 tab  45 tablet  3  . mometasone (NASONEX) 50 MCG/ACT nasal spray Place 2 sprays into the nose as directed. Once a day  17 g  3  . pantoprazole (PROTONIX) 40 MG tablet Take 1 tablet (40 mg total) by mouth daily.  90 tablet  3  . Hydrocodone-Acetaminophen (VICODIN HP) 10-300 MG TABS  Take 1-2 tablets by mouth every 6 (six) hours as needed.  30 each  0    Physical Findings: The patient is in no acute distress. Patient is alert and oriented.  weight is 190 lb 8 oz (86.41 kg). His oral temperature is 97.8 F (36.6 C). His blood pressure is 123/79 and his pulse is 68. His respiration is 20. [name]  No significant changes.  Lab Findings: Lab Results  Component Value Date   WBC 5.9 08/07/2011   HGB 14.0 08/07/2011   HCT 42.6 08/07/2011   MCV 84.7 08/07/2011   PLT 248 08/07/2011    Radiographic Findings:  Patient underwent CT imaging in our clinic for post implant dosimetry. The CT appears to demonstrate an adequate distribution of radioactive seeds throughout the prostate gland. There no seeds in her near the rectum. I suspect the final radiation plan and dosimetry will show appropriate coverage of the prostate gland.   Impression: The patient is recovering from the effects of radiation. His urinary symptoms should gradually improve over the next 4-6 months. We talked about this today. He is encouraged by his improvement already and is otherwise please with his outcome.   Plan: Today, I spent time talking to the patient about his prostate seed implant and  resolving urinary symptoms. Which for long-term followup for prostate cancer following seed implant. He understands that ongoing PSA determinations and digital rectal exams will help perform surveillance to rule out disease recurrence. He understands what to expect with his PSA measures. Patient was also educated today about some of the long-term effects would radiation including the Small risk for rectal bleeding and possibly erectile dysfunction. Talked about some of the general management approaches to these potential complications. However, I did encourage the patient to contact her office or return at any point if he has questions or concerns related to his previous radiation and prostate  cancer.   _____________________________________  Artist Pais. Kathrynn Running, M.D.

## 2011-09-24 ENCOUNTER — Ambulatory Visit: Payer: BC Managed Care – PPO | Admitting: Radiation Oncology

## 2011-09-25 ENCOUNTER — Encounter: Payer: Self-pay | Admitting: Radiation Oncology

## 2011-09-25 NOTE — Progress Notes (Signed)
  Radiation Oncology         [phone]) [phone] ________________________________  Name: Elihu Milstein MRN: [id number]  Date: 09/25/2011  DOB: Jul 20, 1950  3-D Planning Note Prostate Brachytherapy  Diagnosis: A 61 year old gentlemen with stage T1c adenocarcinoma of the prostate with a Gleason of 3+3 and a PSA of 5.0  Narrative: Lorinda Creed returned following prostate seed implantation for post implant planning. He underwent CT scan to delineate the three-dimensional structures of the pelvis and demonstrate the radiation distribution.  Results:   Prostate Coverage - The dose of radiation delivered to the 90% or more of the prostate gland (D90) was 109.29% of the prescription dose. This exceeds our goal of greater than 90%. Rectal Sparing - The volume of rectal tissue receiving the prescription dose or higher was 1.34 cc. This falls slightly higher than our threshold tolerance of 1.0 cc.  Impression: The prostate seed implant appears to show adequate target coverage and appropriate rectal sparing.  Plan:  The patient will continue to follow with urology for ongoing PSA determinations. I would anticipate a high likelihood for local tumor control with low risk for rectal morbidity.   Artist Pais Kathrynn Running, M.D.

## 2011-11-06 ENCOUNTER — Encounter: Payer: Self-pay | Admitting: *Deleted

## 2011-11-23 ENCOUNTER — Telehealth: Payer: Self-pay | Admitting: Family Medicine

## 2011-11-23 MED ORDER — ATORVASTATIN CALCIUM 40 MG PO TABS: 40.0000 mg | ORAL_TABLET | Freq: Every day | ORAL | Status: DC

## 2011-11-23 MED ORDER — METOPROLOL TARTRATE 25 MG PO TABS: 12.5000 mg | ORAL_TABLET | Freq: Two times a day (BID) | ORAL | Status: DC

## 2011-11-23 MED ORDER — PANTOPRAZOLE SODIUM 40 MG PO TBEC
40.0000 mg | DELAYED_RELEASE_TABLET | Freq: Every day | ORAL | Status: DC
Start: 2011-11-23 — End: 2011-12-22

## 2011-11-23 MED ORDER — EZETIMIBE 10 MG PO TABS: 10.0000 mg | ORAL_TABLET | Freq: Every day | ORAL | Status: DC

## 2011-11-23 MED ORDER — LISINOPRIL 10 MG PO TABS: 5.0000 mg | ORAL_TABLET | Freq: Every day | ORAL | Status: DC

## 2011-11-23 MED ORDER — HYDROCHLOROTHIAZIDE 25 MG PO TABS: 25.0000 mg | ORAL_TABLET | Freq: Every day | ORAL | Status: DC

## 2011-11-23 MED ORDER — MOMETASONE FUROATE 50 MCG/ACT NA SUSP: 2.0000 | NASAL | Status: DC

## 2011-11-23 NOTE — Telephone Encounter (Signed)
Please let him know they have been sent in.  Recently [employer] has been denying requests for acid reflux medications such as Protonix because most of them are available OTC.  If they deny the refill, there is not a lot we can do.  He can take OTC Omeprazole.

## 2011-11-23 NOTE — Telephone Encounter (Signed)
Justin Ayala is out of his meds and [facility] [address]  The mail order pharmacy he was using is no longer in effect.  Want to be informed when rxs have been sent.  He need 30 day supply on those listed below until appt on 7/17.  He would also like to have a 90 day supply written at his appt time for the metoprolol  Meds needed.  hctz Lisinopril Metoprolol l nasonex protonix zetia Atorvastatin  Will get remaining refill rxs at appt.

## 2011-11-23 NOTE — Telephone Encounter (Signed)
LMOM for patient to call back to inform of below.

## 2011-11-23 NOTE — Telephone Encounter (Signed)
Message was relayed to the patient

## 2011-12-09 ENCOUNTER — Ambulatory Visit: Payer: BC Managed Care – PPO | Admitting: Family Medicine

## 2011-12-21 ENCOUNTER — Other Ambulatory Visit: Payer: Self-pay | Admitting: *Deleted

## 2011-12-21 MED ORDER — FERROUS SULFATE 325 (65 FE) MG PO TABS: 325.0000 mg | ORAL_TABLET | Freq: Every day | ORAL | Status: DC

## 2011-12-22 ENCOUNTER — Ambulatory Visit (INDEPENDENT_AMBULATORY_CARE_PROVIDER_SITE_OTHER): Payer: BC Managed Care – PPO | Admitting: Family Medicine

## 2011-12-22 ENCOUNTER — Encounter: Payer: Self-pay | Admitting: Family Medicine

## 2011-12-22 ENCOUNTER — Other Ambulatory Visit: Payer: Self-pay | Admitting: *Deleted

## 2011-12-22 VITALS — BP 128/84 | HR 60 | Temp 97.8°F | Ht 67.0 in | Wt 193.0 lb

## 2011-12-22 DIAGNOSIS — C61 Malignant neoplasm of prostate: Secondary | ICD-10-CM

## 2011-12-22 DIAGNOSIS — E785 Hyperlipidemia, unspecified: Secondary | ICD-10-CM

## 2011-12-22 DIAGNOSIS — I10 Essential (primary) hypertension: Secondary | ICD-10-CM

## 2011-12-22 LAB — PSA: PSA: 1.83

## 2011-12-22 MED ORDER — LISINOPRIL 10 MG PO TABS: 5.0000 mg | ORAL_TABLET | Freq: Every day | ORAL | Status: DC

## 2011-12-22 MED ORDER — EZETIMIBE 10 MG PO TABS: 10.0000 mg | ORAL_TABLET | Freq: Every day | ORAL | Status: DC

## 2011-12-22 MED ORDER — FERROUS SULFATE 325 (65 FE) MG PO TABS: 325.0000 mg | ORAL_TABLET | Freq: Every day | ORAL | Status: DC

## 2011-12-22 MED ORDER — MOMETASONE FUROATE 50 MCG/ACT NA SUSP: 2.0000 | NASAL | Status: DC

## 2011-12-22 MED ORDER — PANTOPRAZOLE SODIUM 40 MG PO TBEC: 40.0000 mg | DELAYED_RELEASE_TABLET | Freq: Every day | ORAL | Status: DC

## 2011-12-22 MED ORDER — OMEGA-3 FATTY ACIDS 1000 MG PO CAPS: 1.0000 g | ORAL_CAPSULE | Freq: Every day | ORAL | Status: DC

## 2011-12-22 MED ORDER — TAMSULOSIN HCL 0.4 MG PO CAPS: 0.4000 mg | ORAL_CAPSULE | Freq: Two times a day (BID) | ORAL | Status: DC

## 2011-12-22 MED ORDER — METOPROLOL TARTRATE 25 MG PO TABS: 12.5000 mg | ORAL_TABLET | Freq: Two times a day (BID) | ORAL | Status: DC

## 2011-12-22 MED ORDER — ATORVASTATIN CALCIUM 40 MG PO TABS: 40.0000 mg | ORAL_TABLET | Freq: Every day | ORAL | Status: DC

## 2011-12-22 MED ORDER — HYDROCHLOROTHIAZIDE 25 MG PO TABS: 25.0000 mg | ORAL_TABLET | Freq: Every day | ORAL | Status: DC

## 2011-12-22 MED ORDER — ASPIRIN EC 81 MG PO TBEC: 81.0000 mg | DELAYED_RELEASE_TABLET | ORAL | Status: DC

## 2011-12-22 NOTE — Patient Instructions (Signed)
It was great to see you today! I'm glad things are going well with your prostate cancer.  Keep me updated about any changes. If you need to come back for labs, please schedule a lab appointment at the front desk on your way out. I will call you if any results are abnormal.  Otherwise you will get a letter in the mail.

## 2011-12-25 ENCOUNTER — Other Ambulatory Visit: Payer: BC Managed Care – PPO

## 2011-12-25 DIAGNOSIS — E785 Hyperlipidemia, unspecified: Secondary | ICD-10-CM

## 2011-12-25 LAB — LIPID PANEL: LDL Cholesterol: 76 mg/dL (ref 0–99)

## 2011-12-25 NOTE — Progress Notes (Signed)
FLP DONE TODAY Justin Ayala 

## 2011-12-29 ENCOUNTER — Encounter: Payer: Self-pay | Admitting: Family Medicine

## 2011-12-29 NOTE — Assessment & Plan Note (Signed)
Tolerating statin.  Due for LDL.  Will recheck again in 6 months.

## 2011-12-29 NOTE — Assessment & Plan Note (Signed)
Normotensive, tolerating meds.  Will continue current therapy and recheck in 3-6 months.

## 2011-12-29 NOTE — Progress Notes (Signed)
Patient ID: Justin Ayala, male   DOB: [birth year], 61 y.o.   MRN: [id number] Subjective: The patient is a 61 y.o. year old male who presents today for f/u.  1. HLD: No myalgias.  Taking medication.  Due for recheck of LDL today.  2. HTN: No CP/SOB/DOE/n/v/d/abd pain, LE swelling, visual changes, headaches.  Compliant with meds.  3. Prostate Ca: Followed by urology, with implants.  Doing well.  No complaints of dysuria.  Patient's past medical, social, and family history were reviewed and updated as appropriate. History  Substance Use Topics  . Smoking status: Never Smoker   . Smokeless tobacco: Not on file  . Alcohol Use: No   Objective:  Filed Vitals:   12/22/11 0911  BP: 128/84  Pulse: 60  Temp: 97.8 F (36.6 C)   Gen: NAD CV: RRR Resp: CTABL Ext: No edema  Assessment/Plan:  Please also see individual problems in problem list for problem-specific plans.

## 2011-12-29 NOTE — Assessment & Plan Note (Signed)
PSA entered today.  Is down from presentation.  Have asked to be kept informed of progress.

## 2012-01-04 ENCOUNTER — Encounter: Payer: Self-pay | Admitting: Family Medicine

## 2012-11-17 ENCOUNTER — Telehealth: Payer: Self-pay | Admitting: Cardiovascular Disease

## 2012-11-17 NOTE — Telephone Encounter (Signed)
Returned call. Left message to call back.

## 2012-11-17 NOTE — Telephone Encounter (Signed)
Has question about his Lisinoporil.  He is having problems with ED and want to know if this medication could be the problem.

## 2012-11-18 NOTE — Telephone Encounter (Signed)
Returned call.  Pt stated he wanted to know if the lisinopril could be causing the ED problems.  Pt informed RN discussed w/ PharmD and advised ED more likely to be r/t beta blocker than lisinopril as it has <1% chance of causing ED problems.  Pt verbalized understanding.  Pt stated he didn't know if he mentioned it at his last visit or not, but he was diagnosed w/ prostate CA and has been getting treatment since March 2013.  Stated they implanted the seeds and he has been getting radiation.  Stated his last treatment is next month.  Asked pt if they discussed the possibility of ED w/ him prior to treatment and pt stated they did.  Stated he was told he could become impotent and he was aware of that risk.  Pt informed his symptoms could be a combination of the treatment and the metoprolol he is taking.  Pt wants to know what Dr. Allyson Sabal thinks.  Pt informed Dr. Allyson Sabal will be notified for further instructions.  Pt verbalized understanding and agreed w/ plan. =

## 2012-11-18 NOTE — Telephone Encounter (Signed)
Mr.Gerorgy is returning your call    Thanks

## 2012-12-20 ENCOUNTER — Other Ambulatory Visit: Payer: Self-pay | Admitting: Family Medicine

## 2012-12-20 ENCOUNTER — Encounter: Payer: Self-pay | Admitting: Family Medicine

## 2012-12-20 ENCOUNTER — Ambulatory Visit (INDEPENDENT_AMBULATORY_CARE_PROVIDER_SITE_OTHER): Payer: BC Managed Care – PPO | Admitting: Family Medicine

## 2012-12-20 VITALS — BP 119/79 | HR 69 | Ht 67.0 in | Wt 193.0 lb

## 2012-12-20 DIAGNOSIS — Z23 Encounter for immunization: Secondary | ICD-10-CM

## 2012-12-20 DIAGNOSIS — L989 Disorder of the skin and subcutaneous tissue, unspecified: Secondary | ICD-10-CM | POA: Insufficient documentation

## 2012-12-20 DIAGNOSIS — N529 Male erectile dysfunction, unspecified: Secondary | ICD-10-CM

## 2012-12-20 DIAGNOSIS — R21 Rash and other nonspecific skin eruption: Secondary | ICD-10-CM

## 2012-12-20 DIAGNOSIS — I10 Essential (primary) hypertension: Secondary | ICD-10-CM

## 2012-12-20 LAB — CBC
MCH: 28.8 pg (ref 26.0–34.0)
MCHC: 34.5 g/dL (ref 30.0–36.0)
MCV: 83.4 fL (ref 78.0–100.0)
Platelets: 217 10*3/uL (ref 150–400)
RDW: 13.2 % (ref 11.5–15.5)

## 2012-12-20 LAB — COMPREHENSIVE METABOLIC PANEL
ALT: 97 U/L — ABNORMAL HIGH (ref 0–53)
AST: 103 U/L — ABNORMAL HIGH (ref 0–37)
Alkaline Phosphatase: 67 U/L (ref 39–117)
CO2: 30 mEq/L (ref 19–32)
Creat: 1.12 mg/dL (ref 0.50–1.35)
Total Bilirubin: 0.5 mg/dL (ref 0.3–1.2)

## 2012-12-20 LAB — LIPID PANEL
Cholesterol: 133 mg/dL (ref 0–200)
HDL: 38 mg/dL — ABNORMAL LOW (ref >39)
Total CHOL/HDL Ratio: 3.5 Ratio

## 2012-12-20 MED ORDER — LISINOPRIL 10 MG PO TABS: 5.0000 mg | ORAL_TABLET | Freq: Every day | ORAL | Status: DC

## 2012-12-20 MED ORDER — HYDROCHLOROTHIAZIDE 25 MG PO TABS: 25.0000 mg | ORAL_TABLET | Freq: Every day | ORAL | Status: DC

## 2012-12-20 MED ORDER — FERROUS SULFATE 325 (65 FE) MG PO TABS: 325.0000 mg | ORAL_TABLET | Freq: Every day | ORAL | Status: DC

## 2012-12-20 MED ORDER — ASPIRIN EC 81 MG PO TBEC: 81.0000 mg | DELAYED_RELEASE_TABLET | ORAL | Status: DC

## 2012-12-20 MED ORDER — MOMETASONE FUROATE 50 MCG/ACT NA SUSP: 2.0000 | NASAL | Status: DC

## 2012-12-20 MED ORDER — METOPROLOL TARTRATE 25 MG PO TABS: 12.5000 mg | ORAL_TABLET | Freq: Two times a day (BID) | ORAL | Status: DC

## 2012-12-20 MED ORDER — EZETIMIBE 10 MG PO TABS: 10.0000 mg | ORAL_TABLET | Freq: Every day | ORAL | Status: DC

## 2012-12-20 MED ORDER — HYDROCORTISONE 1 % EX OINT: TOPICAL_OINTMENT | Freq: Two times a day (BID) | CUTANEOUS | Status: DC

## 2012-12-20 MED ORDER — PANTOPRAZOLE SODIUM 40 MG PO TBEC: 40.0000 mg | DELAYED_RELEASE_TABLET | Freq: Every day | ORAL | Status: DC

## 2012-12-20 MED ORDER — TAMSULOSIN HCL 0.4 MG PO CAPS: 0.4000 mg | ORAL_CAPSULE | Freq: Two times a day (BID) | ORAL | Status: DC

## 2012-12-20 NOTE — Assessment & Plan Note (Signed)
I spoke with Justin Ayala today about methods that he has used to remedy his impotency. We also spoke about mechanical methods of producing erections. He was open to exploring those options with his urologist and said he would bring it up during his next visit, which is next Tuesday.

## 2012-12-20 NOTE — Patient Instructions (Addendum)
Hi Mr. Solly. It was a pleasure meting you today. We spoke about your sexual health and discussed a plan to speak to your Urologist about any questions you may have. You were very healthy on exam and your vitals. We are getting your blood work done today. I spoke to you about getting a Zoster vaccine and Tdap vaccine. If you have any questions please do not hesitate to call me. Please follow up in one year. Thank you.  Sincerely,  Jacquelin Hawking, MD

## 2012-12-20 NOTE — Assessment & Plan Note (Signed)
Rash is most likely due to venous insufficiency. Prescribed hydrocortisone 1% ointment to be applied twice per day.

## 2012-12-20 NOTE — Progress Notes (Signed)
  Subjective:    Patient ID: Justin Ayala, male    DOB: 1951/05/04, 62 y.o.   MRN: [id number]  HPI Comments: Justin Ayala came for his yearly physical. His only complaints were a one year history of sexual dysfunction. He has not been able to sustain an erection since he had surgery for his prostate cancer in March of 2013. He has tried Viagra, Writer, which were prescribed by his urologist, and they have not helped. His next appointment with his urologist is next week Tuesday.     Review of Systems  Constitutional: Negative.   HENT: Negative.   Eyes: Negative.   Respiratory: Negative.   Cardiovascular: Negative.   Gastrointestinal: Negative.   Endocrine: Negative.   Genitourinary: Positive for decreased urine volume and difficulty urinating.  Musculoskeletal: Negative.   Skin: Positive for rash.  Allergic/Immunologic: Negative.   Neurological: Negative.   Hematological: Negative.   Psychiatric/Behavioral: Negative.        Objective:   Physical Exam  Constitutional: He is oriented to person, place, and time. He appears well-developed and well-nourished. No distress.  HENT:  Right Ear: External ear normal.  Left Ear: External ear normal.  Nose: Nose normal.  Mouth/Throat: Oropharynx is clear and moist.  Eyes: Conjunctivae are normal. Pupils are equal, round, and reactive to light. No scleral icterus.  Cardiovascular: Normal rate, regular rhythm, normal heart sounds and intact distal pulses.  Exam reveals no gallop and no friction rub.   No murmur heard. Pulmonary/Chest: Effort normal and breath sounds normal. No respiratory distress. He has no wheezes. He has no rales. He exhibits no tenderness.  Abdominal: Soft. Bowel sounds are normal. He exhibits no distension. There is no tenderness.  Musculoskeletal: Normal range of motion. He exhibits edema. He exhibits no tenderness.  Neurological: He is alert and oriented to person, place, and time. He has normal reflexes.   Skin: Skin is warm. Rash noted. He is not diaphoretic. No cyanosis or erythema. No pallor. Nails show no clubbing.  His rash is on the left side of his left leg near his ankle. It is a hyperpigmented patch measuring about 4cm. It is dry, otherwise no other features.          Assessment & Plan:

## 2013-01-09 ENCOUNTER — Telehealth: Payer: Self-pay | Admitting: Family Medicine

## 2013-01-09 NOTE — Telephone Encounter (Signed)
Forward to PCP for refills

## 2013-01-09 NOTE — Telephone Encounter (Signed)
[facility] is calling [facility] [address] [facility] requests for his Iron and his Lipitor (this will be enough to last until he gets his meds from [facility])  and they have not heard back yet.  He is also going to need a Rx for Lipitor that he will mail off to [facility], that needs to be for 40mg , once daily, 90 day supply with 3 refills.  He would like to come pick the Rx up.

## 2013-01-10 ENCOUNTER — Encounter: Payer: Self-pay | Admitting: Family Medicine

## 2013-01-12 NOTE — Telephone Encounter (Signed)
[facility] called and Lisinopril was sent to pt on 8/12 Verbal order called in for iron, [facility] states it was not received but according to chart it was sent on 7/29 Pharmacy will call and inform pt that meds are on the way. Wyatt Haste, RN-BSN

## 2013-01-12 NOTE — Telephone Encounter (Signed)
Today will be 24 hours since the pharmacy started faxing the refill request, but actually the patient has been waiting since July 29th because Dr. Caleb Ayala forgot to fill these meds when the patient came in for his appt so the patient needs this to be taken care of today.

## 2013-01-13 NOTE — Telephone Encounter (Signed)
Called patient and left message to let him know a prescription for his Lipitor was written for him earlier this week to pick up. I realize he may not have known to pick it up and made him aware of my suspicion.

## 2013-01-16 ENCOUNTER — Telehealth: Payer: Self-pay | Admitting: *Deleted

## 2013-01-16 NOTE — Telephone Encounter (Signed)
12 day supply of atorvastin called in  - it will take that long for meds to come by mail.Wyatt Haste, RN-BSN

## 2013-01-25 ENCOUNTER — Telehealth: Payer: Self-pay | Admitting: *Deleted

## 2013-01-25 DIAGNOSIS — E785 Hyperlipidemia, unspecified: Secondary | ICD-10-CM

## 2013-01-25 MED ORDER — ATORVASTATIN CALCIUM 40 MG PO TABS: 40.0000 mg | ORAL_TABLET | Freq: Every day | ORAL | Status: DC

## 2013-01-25 NOTE — Telephone Encounter (Signed)
Please resend script with directions for atorvastin with DIRECTIONS - script is missing directions. Thanks! Wyatt Haste, RN-BSN

## 2013-01-27 ENCOUNTER — Telehealth: Payer: Self-pay | Admitting: Family Medicine

## 2013-01-27 ENCOUNTER — Other Ambulatory Visit: Payer: BC Managed Care – PPO

## 2013-01-27 DIAGNOSIS — E785 Hyperlipidemia, unspecified: Secondary | ICD-10-CM

## 2013-01-27 NOTE — Telephone Encounter (Signed)
Pt called because there are issues with him receiving refills on his medications. He has two days left and then he will be out. He gets his medications from a mail order service so it is important that he gets his refills request in a timely matter. He talked to Dr. Mal Misty and was told to come in and have a comprehensive metabolic panel so that Dr. Mal Misty can see what his transaminases are. He has an appointment today 9/5 at 1:45. He would like refills called after that or enough to get him through until Dr. Mal Misty reviews the lab results. JW

## 2013-01-27 NOTE — Progress Notes (Signed)
CMP DONE TODAY Justin Ayala 

## 2013-01-28 LAB — COMPREHENSIVE METABOLIC PANEL
Albumin: 4.2 g/dL (ref 3.5–5.2)
BUN: 19 mg/dL (ref 6–23)
CO2: 30 mEq/L (ref 19–32)
Calcium: 9.2 mg/dL (ref 8.4–10.5)
Chloride: 102 mEq/L (ref 96–112)
Glucose, Bld: 85 mg/dL (ref 70–99)
Potassium: 3.8 mEq/L (ref 3.5–5.3)
Sodium: 138 mEq/L (ref 135–145)
Total Protein: 7 g/dL (ref 6.0–8.3)

## 2013-01-30 NOTE — Telephone Encounter (Signed)
Called patient and received voice mail. I left a message stating I will call him back tomorrow, as I am unsure of which medication he still needs. He has his iron pills and a prescription for lipitor was written in August for him to pick up, of which he was made aware.

## 2013-02-08 ENCOUNTER — Encounter: Payer: Self-pay | Admitting: *Deleted

## 2013-02-14 ENCOUNTER — Encounter: Payer: Self-pay | Admitting: Cardiovascular Disease

## 2013-02-15 ENCOUNTER — Ambulatory Visit (INDEPENDENT_AMBULATORY_CARE_PROVIDER_SITE_OTHER): Payer: BC Managed Care – PPO | Admitting: Cardiovascular Disease

## 2013-02-15 ENCOUNTER — Encounter: Payer: Self-pay | Admitting: Cardiovascular Disease

## 2013-02-15 ENCOUNTER — Telehealth: Payer: Self-pay | Admitting: Family Medicine

## 2013-02-15 VITALS — BP 118/84 | HR 54 | Ht 67.0 in | Wt 197.0 lb

## 2013-02-15 DIAGNOSIS — I2510 Atherosclerotic heart disease of native coronary artery without angina pectoris: Secondary | ICD-10-CM

## 2013-02-15 DIAGNOSIS — I10 Essential (primary) hypertension: Secondary | ICD-10-CM

## 2013-02-15 DIAGNOSIS — E785 Hyperlipidemia, unspecified: Secondary | ICD-10-CM

## 2013-02-15 NOTE — Assessment & Plan Note (Signed)
Recent lab work revealed a total cholesterol 133, LDL of 79 and HDL of 38 on statin therapy

## 2013-02-15 NOTE — Assessment & Plan Note (Signed)
Well-controlled on current medications 

## 2013-02-15 NOTE — Assessment & Plan Note (Signed)
Status post cardiac catheterization by myself 10/12/08 revealing a 60-70% proximal first diagonal branch stenosis and 50% posterior descending artery stenosis of the dominant left system with normal LV function. He denies chest pain or shortness of breath.

## 2013-02-15 NOTE — Patient Instructions (Addendum)
Your physician wants you to follow-up in: 1 year with Dr Berry. You will receive a reminder letter in the mail two months in advance. If you don't receive a letter, please call our office to schedule the follow-up appointment.  

## 2013-02-15 NOTE — Progress Notes (Signed)
02/15/2013 Justin Ayala   [birth year]  [id number]  Primary Physician Jacquelin Hawking, MD Primary Cardiologist: Runell Gess MD Justin Ayala   HPI: The patient is a before-year-old mildly overweight married [place] American male, father of 3, grandfather to 4 grandchildren, who I saw a year ago. He has a history of moderate, but not critical, CAD by catheterization, which I performed Oct 12, 2008. He had a 60% to 70% proximal first diagonal branch stenosis. He had 50% distal dominant circumflex stenosis in the PDA with normal LV function. His other problems include hypertension and hyperlipidemia. He does not smoke. He has been exercising more recently. He has had a gastric polyp in the past, found in the setting of a GI workup for GI bleed. He was transfused at that time. His most recent lipid profile performed by [facility] revealed a total cholesterol of 133/LDL of 79 and HDL of 38  He is completely asymptomatic.    Current Outpatient Prescriptions  Medication Sig Dispense Refill  . aspirin EC 81 MG tablet Take 1 tablet (81 mg total) by mouth every morning.  90 tablet  3  . Cranberry-Vitamin C-Vitamin E (CRANBERRY PLUS VITAMIN C PO) Take 1,500 mg by mouth daily.      [name] ezetimibe (ZETIA) 10 MG tablet Take 1 tablet (10 mg total) by mouth daily.  90 tablet  3  . ferrous sulfate 325 (65 FE) MG tablet Take 1 tablet (325 mg total) by mouth daily.  90 tablet  3  . GARCINIA CAMBOGIA-CHROMIUM PO Take 1 tablet by mouth daily.      . Ginkgo Biloba 40 MG TABS Take 60 mg by mouth daily.      . Glucosamine HCl 1000 MG TABS Take 1 tablet by mouth daily.      . hydrochlorothiazide (HYDRODIURIL) 25 MG tablet Take 1 tablet (25 mg total) by mouth daily.  90 tablet  3  . hydrocortisone 1 % ointment Apply 1 application topically as needed. Apply to rash on leg      . Korean Ginseng 100 MG CAPS Take 100 mg by mouth daily.      [name] lisinopril (PRINIVIL,ZESTRIL) 10 MG tablet Take 0.5  tablets (5 mg total) by mouth daily. Take 1/2 tab for high blood pressure  45 tablet  3  . meloxicam (MOBIC) 7.5 MG tablet Take 7.5 mg by mouth 2 (two) times daily.      . metoprolol tartrate (LOPRESSOR) 25 MG tablet Take 0.5 tablets (12.5 mg total) by mouth 2 (two) times daily.  90 tablet  3  . mometasone (NASONEX) 50 MCG/ACT nasal spray Place 2 sprays into the nose as directed. Once a day  17 g  9  . pantoprazole (PROTONIX) 40 MG tablet Take 1 tablet (40 mg total) by mouth daily.  90 tablet  3  . Pyridoxine HCl (VITAMIN B-6 PO) Take by mouth daily.      . tadalafil (CIALIS) 5 MG tablet Take 5 mg by mouth daily as needed for erectile dysfunction.      . tamsulosin (FLOMAX) 0.4 MG CAPS Take 1 capsule (0.4 mg total) by mouth 2 (two) times daily.  180 capsule  3  . vitamin B-12 (CYANOCOBALAMIN) 1000 MCG tablet Take 1,000 mcg by mouth daily.      . vitamin E 400 UNIT capsule Take 400 Units by mouth daily.      [name] atorvastatin (LIPITOR) 40 MG tablet Take 40 mg by mouth daily.  No current facility-administered medications for this visit.    Allergies  Allergen Reactions  . Ace Inhibitors Cough    History   Social History  . Marital Status: Married    Spouse Name: N/A    Number of Children: 3  . Years of Education: BS    Occupational History  .     Social History Main Topics  . Smoking status: Never Smoker   . Smokeless tobacco: Not on file  . Alcohol Use: No  . Drug Use: No  . Sexual Activity: Yes    Partners: Female   Other Topics Concern  . Not on file   Social History Narrative   married x 30+years; 3 children, 4 grandsons    no smoking or ETOH;    Retired Pensions consultant formerly at [employer];    likes to golf (golfs 3-4 times a week); no exercise and rides cart while golfing           Review of Systems: General: negative for chills, fever, night sweats or weight changes.  Cardiovascular: negative for chest pain, dyspnea on exertion, edema, orthopnea,  palpitations, paroxysmal nocturnal dyspnea or shortness of breath Dermatological: negative for rash Respiratory: negative for cough or wheezing Urologic: negative for hematuria Abdominal: negative for nausea, vomiting, diarrhea, bright red blood per rectum, melena, or hematemesis Neurologic: negative for visual changes, syncope, or dizziness All other systems reviewed and are otherwise negative except as noted above.    Blood pressure 118/84, pulse 54, height 5\' 7"  (1.702 m), weight 197 lb (89.359 kg).  General appearance: alert and no distress Neck: no adenopathy, no carotid bruit, no JVD, supple, symmetrical, trachea midline and thyroid not enlarged, symmetric, no tenderness/mass/nodules Lungs: clear to auscultation bilaterally Heart: regular rate and rhythm, S1, S2 normal, no murmur, click, rub or gallop Extremities: extremities normal, atraumatic, no cyanosis or edema  EKG sinus bradycardia 54 with a Q wave in lead 3 and T wave inversion in leads 3 and F.  ASSESSMENT AND PLAN:   CAD (coronary artery disease) Status post cardiac catheterization by myself 10/12/08 revealing a 60-70% proximal first diagonal branch stenosis and 50% posterior descending artery stenosis of the dominant left system with normal LV function. He denies chest pain or shortness of breath.  HYPERTENSION, BENIGN SYSTEMIC Well-controlled on current medications  HYPERLIPIDEMIA Recent lab work revealed a total cholesterol 133, LDL of 79 and HDL of 38 on statin therapy      Runell Gess MD [facility], [facility] 02/15/2013 11:41 AM

## 2013-02-21 ENCOUNTER — Telehealth: Payer: Self-pay | Admitting: Family Medicine

## 2013-02-21 MED ORDER — ATORVASTATIN CALCIUM 40 MG PO TABS: 40.0000 mg | ORAL_TABLET | Freq: Every day | ORAL | Status: DC

## 2013-02-21 NOTE — Telephone Encounter (Signed)
Called [facility] and called atorvastatin 40mg  QD 90 tabs x3 refills. They will call patient about prescription. I have let Justin Ayala know that I would be resolving his prescription issues.

## 2013-02-21 NOTE — Telephone Encounter (Signed)
Pt is very  Upset. He says he has made numerous phone calls to get his Lipitor refill. He got a prescription in August but it was only for 12 pills. He uses a mail order for his prescriptions. The mail order said they would not fill it because the dr wanted more test. He had lab work done Sept 5. He says something needs to happen TODAY! 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 He has been out of Lipitor for about month.

## 2013-02-21 NOTE — Telephone Encounter (Signed)
I spoke to Mr. Kimbrell and explained the situation to him. I [facility]. I will call [facility] for them to release his prescription order from earlier.

## 2013-07-26 ENCOUNTER — Emergency Department (HOSPITAL_COMMUNITY)
Admission: EM | Admit: 2013-07-26 | Discharge: 2013-07-26 | Disposition: A | Discharge status: Home / Self Care | Payer: BC Managed Care – PPO | Attending: Emergency Medicine | Admitting: Emergency Medicine

## 2013-07-26 ENCOUNTER — Encounter (HOSPITAL_COMMUNITY): Payer: Self-pay | Admitting: Emergency Medicine

## 2013-07-26 DIAGNOSIS — Z8546 Personal history of malignant neoplasm of prostate: Secondary | ICD-10-CM | POA: Insufficient documentation

## 2013-07-26 DIAGNOSIS — Z7982 Long term (current) use of aspirin: Secondary | ICD-10-CM | POA: Insufficient documentation

## 2013-07-26 DIAGNOSIS — Z791 Long term (current) use of non-steroidal anti-inflammatories (NSAID): Secondary | ICD-10-CM | POA: Insufficient documentation

## 2013-07-26 DIAGNOSIS — E663 Overweight: Secondary | ICD-10-CM | POA: Insufficient documentation

## 2013-07-26 DIAGNOSIS — I10 Essential (primary) hypertension: Secondary | ICD-10-CM | POA: Insufficient documentation

## 2013-07-26 DIAGNOSIS — D649 Anemia, unspecified: Secondary | ICD-10-CM | POA: Insufficient documentation

## 2013-07-26 DIAGNOSIS — Z87442 Personal history of urinary calculi: Secondary | ICD-10-CM | POA: Insufficient documentation

## 2013-07-26 DIAGNOSIS — K219 Gastro-esophageal reflux disease without esophagitis: Secondary | ICD-10-CM | POA: Insufficient documentation

## 2013-07-26 DIAGNOSIS — Z9889 Other specified postprocedural states: Secondary | ICD-10-CM | POA: Insufficient documentation

## 2013-07-26 DIAGNOSIS — K625 Hemorrhage of anus and rectum: Secondary | ICD-10-CM | POA: Diagnosis present

## 2013-07-26 DIAGNOSIS — I2510 Atherosclerotic heart disease of native coronary artery without angina pectoris: Secondary | ICD-10-CM | POA: Insufficient documentation

## 2013-07-26 DIAGNOSIS — E785 Hyperlipidemia, unspecified: Secondary | ICD-10-CM | POA: Insufficient documentation

## 2013-07-26 DIAGNOSIS — Z8711 Personal history of peptic ulcer disease: Secondary | ICD-10-CM | POA: Insufficient documentation

## 2013-07-26 DIAGNOSIS — Z79899 Other long term (current) drug therapy: Secondary | ICD-10-CM | POA: Insufficient documentation

## 2013-07-26 LAB — BASIC METABOLIC PANEL
BUN: 18 mg/dL (ref 6–23)
CO2: 28 mEq/L (ref 19–32)
Calcium: 9.1 mg/dL (ref 8.4–10.5)
Chloride: 102 mEq/L (ref 96–112)
Creatinine, Ser: 1.1 mg/dL (ref 0.50–1.35)
GFR calc Af Amer: 80 mL/min — ABNORMAL LOW (ref >90)
GFR calc non Af Amer: 69 mL/min — ABNORMAL LOW (ref >90)
GLUCOSE: 113 mg/dL — AB (ref 70–99)
POTASSIUM: 3.7 meq/L (ref 3.7–5.3)
Sodium: 142 mEq/L (ref 137–147)

## 2013-07-26 LAB — CBC WITH DIFFERENTIAL/PLATELET
BASOS PCT: 0 % (ref 0–1)
Basophils Absolute: 0 10*3/uL (ref 0.0–0.1)
EOS PCT: 6 % — AB (ref 0–5)
Eosinophils Absolute: 0.3 10*3/uL (ref 0.0–0.7)
HCT: 37.5 % — ABNORMAL LOW (ref 39.0–52.0)
HEMOGLOBIN: 12.6 g/dL — AB (ref 13.0–17.0)
LYMPHS ABS: 1.4 10*3/uL (ref 0.7–4.0)
Lymphocytes Relative: 28 % (ref 12–46)
MCH: 28.4 pg (ref 26.0–34.0)
MCHC: 33.6 g/dL (ref 30.0–36.0)
MCV: 84.5 fL (ref 78.0–100.0)
MONOS PCT: 10 % (ref 3–12)
Monocytes Absolute: 0.5 10*3/uL (ref 0.1–1.0)
Neutro Abs: 2.7 10*3/uL (ref 1.7–7.7)
Neutrophils Relative %: 56 % (ref 43–77)
Platelets: 234 10*3/uL (ref 150–400)
RBC: 4.44 MIL/uL (ref 4.22–5.81)
RDW: 13 % (ref 11.5–15.5)
WBC: 4.8 10*3/uL (ref 4.0–10.5)

## 2013-07-26 NOTE — Discharge Instructions (Signed)
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
## 2013-07-26 NOTE — ED Notes (Signed)
Patient had a stool about midnight and noticed bright red blood.  Has a history of the same

## 2013-07-26 NOTE — ED Notes (Signed)
Pt A&Ox4, ambulatory at discharge. 

## 2013-07-26 NOTE — ED Notes (Signed)
Pt denies lightheadedness or dizziness

## 2013-07-26 NOTE — ED Provider Notes (Addendum)
CSN: [id number]     Arrival date & time 07/26/13  0119 History   First MD Initiated Contact with Patient 07/26/13 0444     Chief Complaint  Patient presents with  . Rectal Bleeding     (Consider location/radiation/quality/duration/timing/severity/associated sxs/prior Treatment) Patient is a 63 y.o. male presenting with hematochezia. The history is provided by the patient.  Rectal Bleeding Quality:  Bright red Amount: mild. Duration:  4 hours Timing: once w/ a bm at midnight. Progression:  Resolved Chronicity:  New Context: spontaneously   Similar prior episodes: yes   Relieved by:  Nothing Worsened by:  Nothing tried Ineffective treatments:  None tried Associated symptoms: no abdominal pain, no fever and no vomiting     Past Medical History  Diagnosis Date  . HTN (hypertension)   . HLD (hyperlipidemia)   . GERD (gastroesophageal reflux disease)   . Overweight   . Allergic rhinitis   . CAD (coronary artery disease)     60-70% prox 1st diagonal branch stenosis, 50% dominant circumflex stenosis in PDA, normal LV function (by 10/12/2008 cath)  . Prostate cancer   . Kidney stones   . Peptic ulcer   . Anemia 2010  . History of cardiovascular stress test 10/02/2008    exercise tolerance test - abnormal test - subsequent cath    Past Surgical History  Procedure Laterality Date  . Cardiac catheterization  2010    non-critical stenosis  . Appendectomy  12/2008    Dr. Clyda Greener  . Tonsillectomy    . Biopsy prostate  03/19/11    gleason 3+3=6, volume 34 cc, psa 01/06/11 5.00  . Radioactive seed implant  08/14/2011    Procedure: RADIOACTIVE SEED IMPLANT;  Surgeon: Claybon Jabs, MD;  Location: [facility];  Service: Urology;  Laterality: N/A;  94  SEEDS IMPLANTED IN PROSTATE   Family History  Problem Relation Age of Onset  . Hypertension Father     also organ failure  . Stroke Father   . Hypertension Mother   . Cervical cancer Mother   . Prostate cancer  Brother     seed implant  . Heart failure Paternal Grandmother   . Heart failure Paternal Grandfather    History  Substance Use Topics  . Smoking status: Never Smoker   . Smokeless tobacco: Not on file  . Alcohol Use: No    Review of Systems  Constitutional: Negative for fever.  HENT: Negative for drooling and rhinorrhea.   Eyes: Negative for pain.  Respiratory: Negative for cough and shortness of breath.   Cardiovascular: Negative for chest pain and leg swelling.  Gastrointestinal: Positive for hematochezia. Negative for nausea, vomiting, abdominal pain and diarrhea.  Genitourinary: Negative for dysuria and hematuria.       Rectal bleeding  Musculoskeletal: Negative for gait problem and neck pain.  Skin: Negative for color change.  Neurological: Negative for numbness and headaches.  Hematological: Negative for adenopathy.  Psychiatric/Behavioral: Negative for behavioral problems.  All other systems reviewed and are negative.      Allergies  Ace inhibitors  Home Medications   Current Outpatient Rx  Name  Route  Sig  Dispense  Refill  . aspirin EC 81 MG tablet   Oral   Take 1 tablet (81 mg total) by mouth every morning.   90 tablet   3   . atorvastatin (LIPITOR) 40 MG tablet   Oral   Take 1 tablet (40 mg total) by mouth daily.   [place]  tablet   0   . Cranberry-Vitamin C-Vitamin E (CRANBERRY PLUS VITAMIN C PO)   Oral   Take 1,500 mg by mouth daily.         [name] ezetimibe (ZETIA) 10 MG tablet   Oral   Take 1 tablet (10 mg total) by mouth daily.   90 tablet   3   . ferrous sulfate 325 (65 FE) MG tablet   Oral   Take 1 tablet (325 mg total) by mouth daily.   90 tablet   3   . GARCINIA [place]-CHROMIUM PO   Oral   Take 1 tablet by mouth daily.         . Ginkgo Biloba 40 MG TABS   Oral   Take 60 mg by mouth daily.         . Glucosamine HCl 1000 MG TABS   Oral   Take 1 tablet by mouth daily.         . hydrochlorothiazide (HYDRODIURIL) 25 MG  tablet   Oral   Take 1 tablet (25 mg total) by mouth daily.   90 tablet   3   . hydrocortisone 1 % ointment   Topical   Apply 1 application topically as needed. Apply to rash on leg         . [name] 100 MG CAPS   Oral   Take 100 mg by mouth daily.         [name] lisinopril (PRINIVIL,ZESTRIL) 10 MG tablet   Oral   Take 5 mg by mouth daily.         . meloxicam (MOBIC) 7.5 MG tablet   Oral   Take 7.5 mg by mouth 2 (two) times daily.         . metoprolol tartrate (LOPRESSOR) 25 MG tablet   Oral   Take 0.5 tablets (12.5 mg total) by mouth 2 (two) times daily.   90 tablet   3   . mometasone (NASONEX) 50 MCG/ACT nasal spray   Nasal   Place 2 sprays into the nose as directed. Once a day   17 g   9   . pantoprazole (PROTONIX) 40 MG tablet   Oral   Take 1 tablet (40 mg total) by mouth daily.   90 tablet   3   . Pyridoxine HCl (VITAMIN B-6 PO)   Oral   Take by mouth daily.         . tadalafil (CIALIS) 5 MG tablet   Oral   Take 5 mg by mouth daily as needed for erectile dysfunction.         . tamsulosin (FLOMAX) 0.4 MG CAPS   Oral   Take 1 capsule (0.4 mg total) by mouth 2 (two) times daily.   180 capsule   3   . vitamin B-12 (CYANOCOBALAMIN) 1000 MCG tablet   Oral   Take 1,000 mcg by mouth daily.         . vitamin E 400 UNIT capsule   Oral   Take 400 Units by mouth daily.          BP 111/67  Pulse 71  Temp(Src) 98 F (36.7 C) (Oral)  Resp 18  SpO2 95% Physical Exam  Nursing note and vitals reviewed. Constitutional: He is oriented to person, place, and time. He appears well-developed and well-nourished.  HENT:  Head: Normocephalic and atraumatic.  Right Ear: External ear normal.  Left Ear: External ear normal.  Nose: Nose normal.  Mouth/Throat:  Oropharynx is clear and moist. No oropharyngeal exudate.  Eyes: Conjunctivae and EOM are normal. Pupils are equal, round, and reactive to light.  Neck: Normal range of motion. Neck supple.   Cardiovascular: Normal rate, regular rhythm, normal heart sounds and intact distal pulses.  Exam reveals no gallop and no friction rub.   No murmur heard. Pulmonary/Chest: Effort normal and breath sounds normal. No respiratory distress. He has no wheezes.  Abdominal: Soft. Bowel sounds are normal. He exhibits no distension. There is no tenderness. There is no rebound and no guarding.  Genitourinary: Rectum normal.  Normal appearing external rectum. Normal palpation of rectal vault, no ttp of prostate which is soft. No stool in rectal vault.   Stool appears brown w/ small amount of bright red blood.   Musculoskeletal: Normal range of motion. He exhibits no edema and no tenderness.  Neurological: He is alert and oriented to person, place, and time.  Skin: Skin is warm and dry.  Psychiatric: He has a normal mood and affect. His behavior is normal.    ED Course  Procedures (including critical care time) Labs Review Labs Reviewed  CBC WITH DIFFERENTIAL - Abnormal; Notable for the following:    Hemoglobin 12.6 (*)    HCT 37.5 (*)    Eosinophils Relative 6 (*)    All other components within normal limits  BASIC METABOLIC PANEL - Abnormal; Notable for the following:    Glucose, Bld 113 (*)    GFR calc non Af Amer 69 (*)    GFR calc Af Amer 80 (*)    All other components within normal limits   Imaging Review No results found.   EKG Interpretation None      MDM   Final diagnoses:  Rectal bleeding    5:18 AM 62 y.o. male presents with one episode of bloody stool which began at midnight. He has not had any further episodes. He denies any symptoms of anemia. He is afebrile and vital signs are unremarkable here. Labwork is thus far noncontributory.  He has no other complaints on exam and appears well w/ a soft/benign abdomen.  He is already established w/ a gi doctor. Will have him call today for close followup appointment.  Possible internal hemorrhoids given lack of pain. Doubt gi  ulcer given brb on rectal exam. Pt has not had any further bloody bm's since the one at midnight and is asx. He would prefer to f/u as an outpt.  I have discussed the diagnosis/risks/treatment options with the patient and believe the pt to be eligible for discharge home to follow-up with his GI doctor w/in the next 48 hrs. We also discussed returning to the ED immediately if new or worsening sx occur. We discussed the sx which are most concerning (e.g., further or worsening of bloody stools, sx of anemia discussed, abd pain) that necessitate immediate return. Medications administered to the patient during their visit and any new prescriptions provided to the patient are listed below.  Medications given during this visit Medications - No data to display  Discharge Medication List as of 07/26/2013  5:20 AM         Blanchard Kelch, MD 07/26/13 2029  Blanchard Kelch, MD 07/26/13 2030

## 2013-08-01 ENCOUNTER — Telehealth: Payer: Self-pay | Admitting: *Deleted

## 2013-08-01 NOTE — Telephone Encounter (Signed)
Chart reviewed and Dr Gwenlyn Found gave clearance to proceed with endoscopy procedure. Letter drafted and faxed Dr Benson [place].

## 2013-08-11 ENCOUNTER — Other Ambulatory Visit: Payer: Self-pay | Admitting: Gastroenterology

## 2013-08-18 ENCOUNTER — Encounter (HOSPITAL_COMMUNITY)
Admission: RE | Disposition: A | Discharge status: Home / Self Care | Payer: Self-pay | Source: Ambulatory Visit | Attending: Gastroenterology

## 2013-08-18 ENCOUNTER — Encounter (HOSPITAL_COMMUNITY): Payer: Self-pay

## 2013-08-18 ENCOUNTER — Ambulatory Visit (HOSPITAL_COMMUNITY)
Admission: RE | Admit: 2013-08-18 | Discharge: 2013-08-18 | Disposition: A | Discharge status: Home / Self Care | Payer: BC Managed Care – PPO | Source: Ambulatory Visit | Attending: Gastroenterology | Admitting: Gastroenterology

## 2013-08-18 DIAGNOSIS — K6289 Other specified diseases of anus and rectum: Secondary | ICD-10-CM | POA: Insufficient documentation

## 2013-08-18 DIAGNOSIS — C61 Malignant neoplasm of prostate: Secondary | ICD-10-CM | POA: Insufficient documentation

## 2013-08-18 DIAGNOSIS — E785 Hyperlipidemia, unspecified: Secondary | ICD-10-CM | POA: Insufficient documentation

## 2013-08-18 DIAGNOSIS — Y842 Radiological procedure and radiotherapy as the cause of abnormal reaction of the patient, or of later complication, without mention of misadventure at the time of the procedure: Secondary | ICD-10-CM | POA: Insufficient documentation

## 2013-08-18 DIAGNOSIS — K219 Gastro-esophageal reflux disease without esophagitis: Secondary | ICD-10-CM | POA: Insufficient documentation

## 2013-08-18 DIAGNOSIS — I10 Essential (primary) hypertension: Secondary | ICD-10-CM | POA: Insufficient documentation

## 2013-08-18 DIAGNOSIS — Z9089 Acquired absence of other organs: Secondary | ICD-10-CM | POA: Insufficient documentation

## 2013-08-18 DIAGNOSIS — K648 Other hemorrhoids: Secondary | ICD-10-CM | POA: Insufficient documentation

## 2013-08-18 DIAGNOSIS — K644 Residual hemorrhoidal skin tags: Secondary | ICD-10-CM | POA: Insufficient documentation

## 2013-08-18 DIAGNOSIS — I2510 Atherosclerotic heart disease of native coronary artery without angina pectoris: Secondary | ICD-10-CM | POA: Insufficient documentation

## 2013-08-18 HISTORY — PX: FLEXIBLE SIGMOIDOSCOPY: SHX5431

## 2013-08-18 HISTORY — PX: HOT HEMOSTASIS: SHX5433

## 2013-08-18 SURGERY — SIGMOIDOSCOPY, FLEXIBLE
Anesthesia: Moderate Sedation

## 2013-08-18 MED ORDER — FENTANYL CITRATE 0.05 MG/ML IJ SOLN
INTRAMUSCULAR | Status: DC | PRN
  Administered 2013-08-18 (×3): 25 ug via INTRAVENOUS

## 2013-08-18 MED ORDER — MIDAZOLAM HCL 10 MG/2ML IJ SOLN
INTRAMUSCULAR | Status: AC
  Filled 2013-08-18: qty 2

## 2013-08-18 MED ORDER — SODIUM CHLORIDE 0.9 % IV SOLN: INTRAVENOUS | Status: DC

## 2013-08-18 MED ORDER — FENTANYL CITRATE 0.05 MG/ML IJ SOLN
INTRAMUSCULAR | Status: AC
  Filled 2013-08-18: qty 2

## 2013-08-18 MED ORDER — MIDAZOLAM HCL 10 MG/2ML IJ SOLN
INTRAMUSCULAR | Status: DC | PRN
  Administered 2013-08-18 (×2): 2 mg via INTRAVENOUS
  Administered 2013-08-18: 1 mg via INTRAVENOUS

## 2013-08-18 NOTE — Op Note (Signed)
[facility] [place], [address]   FLEXIBLE SIGMOIDOSCOPY PROCEDURE REPORT  PATIENT: Justin Ayala, Justin Ayala  MR#: [id number] BIRTHDATE: 1951-05-08 , 7  yrs. old GENDER: Male ENDOSCOPIST: Carol Ada, MD REFERRED BY: PROCEDURE DATE:  08/18/2013 PROCEDURE:   Sigmoidoscopy with ablation therapy ASA CLASS:   Class II INDICATIONS: Radiation proctitis MEDICATIONS: Versed 5 mg IV and Fentanyl 50 mcg IV  DESCRIPTION OF PROCEDURE:   After the risks benefits and alternatives of the procedure were thoroughly explained, informed consent was obtained.  revealed no abnormalities of the rectum. The endoscope was introduced through the anus  and advanced to the sigmoid colon , limited by No adverse events experienced.   The quality of the prep was excellent .  The instrument was then slowly withdrawn as the mucosa was fully examined.         FINDINGS: In the rectum the area of radiation proctitis was identified.  Some fresh blood was noted in the area and this is most likely from irritation of the radiation proctitis with the prep.  APC was applied to the area and the entire site was successfully treated. Retroflexed views revealed internal/external hemorrhoid.    The scope was then withdrawn from the patient and the procedure terminated.  COMPLICATIONS: There were no complications.  ENDOSCOPIC IMPRESSION: 1) Radiation proctitis s/p APC. 2) Int/Ext Hemorrhoids.  RECOMMENDATIONS: 1) Follow up in the office in one month.  REPEAT EXAM:   _______________________________ eSignedCarol Ada, MD 08/18/2013 10:01 AM   CC:

## 2013-08-18 NOTE — H&P (Signed)
  Justin Ayala HPI: This is a 63 year old gentleman identified to have radiation proctitis during a recent colonoscopy for a hematochezia work up.  He is here today for APC of the radiation proctitis.  Past Medical History  Diagnosis Date  . HTN (hypertension)   . HLD (hyperlipidemia)   . GERD (gastroesophageal reflux disease)   . Overweight   . Allergic rhinitis   . CAD (coronary artery disease)     60-70% prox 1st diagonal branch stenosis, 50% dominant circumflex stenosis in PDA, normal LV function (by 10/12/2008 cath)  . Prostate cancer   . Kidney stones   . Peptic ulcer   . Anemia 2010  . History of cardiovascular stress test 10/02/2008    exercise tolerance test - abnormal test - subsequent cath     Past Surgical History  Procedure Laterality Date  . Cardiac catheterization  2010    non-critical stenosis  . Appendectomy  12/2008    Dr. Clyda Greener  . Tonsillectomy    . Biopsy prostate  03/19/11    gleason 3+3=6, volume 34 cc, psa 01/06/11 5.00  . Radioactive seed implant  08/14/2011    Procedure: RADIOACTIVE SEED IMPLANT;  Surgeon: Claybon Jabs, MD;  Location: [facility];  Service: Urology;  Laterality: N/A;  41  SEEDS IMPLANTED IN PROSTATE    Family History  Problem Relation Age of Onset  . Hypertension Father     also organ failure  . Stroke Father   . Hypertension Mother   . Cervical cancer Mother   . Prostate cancer Brother     seed implant  . Heart failure Paternal Grandmother   . Heart failure Paternal Grandfather     Social History:  reports that he has never smoked. He does not have any smokeless tobacco history on file. He reports that he does not drink alcohol or use illicit drugs.  Allergies:  Allergies  Allergen Reactions  . Ace Inhibitors Cough    Medications:  Scheduled:  Continuous: . sodium chloride      No results found for this or any previous visit (from the past 24 hour(s)).   No results found.  ROS:  As stated above  in the HPI otherwise negative.  Blood pressure 133/75, pulse 63, temperature 97.6 F (36.4 C), temperature source Oral, resp. rate 24, height 5\' 7"  (1.702 m), weight 194 lb (87.998 kg), SpO2 98.00%.    PE: Gen: NAD, Alert and Oriented HEENT:  [place]/AT, EOMI Neck: Supple, no LAD Lungs: CTA Bilaterally CV: RRR without M/G/R ABM: Soft, NTND, +BS Ext: No C/C/E  Assessment/Plan: 1) Radiation proctitis.  Plan: 1) APC of the lesions.  Lucienne Sawyers D 08/18/2013, 8:54 AM

## 2013-08-18 NOTE — Discharge Instructions (Signed)
Flexible Sigmoidoscopy Your caregiver has ordered a flexible sigmoidoscopy. This is an exam to evaluate your lower colon. In this exam your colon is cleansed and a short fiber optic tube is inserted through your rectum and into your colon. The fiber optic scope (endoscope) is a short bundle of enclosed flexible small glass fibers. It transmits light to the area examined and images from that area to your caregiver. You do not have to worry about glass breakage in the endoscope. Discomfort is usually minimal. Sedatives and pain medications are generally not required. This exam helps to detect tumors (lumps), polyps, inflammation (swelling and soreness), and areas of bleeding. It may also be used to take biopsies. These are small pieces of tissue taken to examine under a microscope. LET YOUR CAREGIVER KNOW ABOUT:  Allergies.  Medications taken including herbs, eye drops, over the counter medications, and creams.  Use of steroids (by mouth or creams).  Previous problems with anesthetics or novocaine  Possibility of pregnancy, if this applies.  History of blood clots (thrombophlebitis).  History of bleeding or blood problems.  Previous surgery.  Other health problems. BEFORE THE PROCEDURE Eat normally the night before the exam. Your caregiver may order a mild enema or laxative the night before. No eating or drinking should occur after midnight until the procedure is completed. A rectal suppository or enemas may be given in the morning prior to your procedure. You will be brought to the examination area in a hospital gown. You should be present 60 minutes prior to your procedure or as directed.  AFTER THE PROCEDURE   There is sometimes a little blood passed with the first bowel movement. Do not be concerned. Because air is often used during the exam, it is not unusual to pass gas and experience abdominal (belly) cramping. Walking or a warm pack on your abdomen may help with this. Do not sleep  with a heating pad as burns can occur.  You may resume all normal eating and activities.  Only take over-the-counter or prescription medicines for pain, discomfort, or fever as directed by your caregiver. Do not use aspirin or blood thinners if a biopsy (tissue sample) was taken. Consult your caregiver for medication usage if biopsies were taken.  Call for your results as instructed by your caregiver. Remember, it is your responsibility to obtain the results of your biopsy. Do not assume everything is fine because you do not hear from your caregiver. SEEK IMMEDIATE MEDICAL CARE IF:  An oral temperature above 102 F (38.9 C) develops.  You pass large blood clots or fill a toilet with blood following the procedure. This may also occur 10 to 14 days following the procedure. It is more likely if a biopsy was taken.  You develop abdominal pain not relieved with medication or that is getting worse rather than better. Document Released: 05/08/2000 Document Revised: 08/03/2011 Document Reviewed: 02/18/2005 [facility] Patient Information 2014 [place].  Conscious Sedation, Adult, Care After Refer to this sheet in the next few weeks. These instructions provide you with information on caring for yourself after your procedure. Your health care provider may also give you more specific instructions. Your treatment has been planned according to current medical practices, but problems sometimes occur. Call your health care provider if you have any problems or questions after your procedure. WHAT TO EXPECT AFTER THE PROCEDURE  After your procedure:  You may feel sleepy, clumsy, and have poor balance for several hours.  Vomiting may occur if you eat too  soon after the procedure. °HOME CARE INSTRUCTIONS °· Do not participate in any activities where you could become injured for at least 24 hours. Do not: °· Drive. °· Swim. °· Ride a bicycle. °· Operate heavy machinery. °· Cook. °· Use power tools. °· Climb  ladders. °· Work from a high place. °· Do not make important decisions or sign legal documents until you are improved. °· If you vomit, drink water, juice, or soup when you can drink without vomiting. Make sure you have little or no nausea before eating solid foods. °· Only take over-the-counter or prescription medicines for pain, discomfort, or fever as directed by your health care provider. °· Make sure you and your family fully understand everything about the medicines given to you, including what side effects may occur. °· You should not drink alcohol, take sleeping pills, or take medicines that cause drowsiness for at least 24 hours. °· If you smoke, do not smoke without supervision. °· If you are feeling better, you may resume normal activities 24 hours after you were sedated. °· Keep all appointments with your health care provider. °SEEK MEDICAL CARE IF: °· Your skin is pale or bluish in color. °· You continue to feel nauseous or vomit. °· Your pain is getting worse and is not helped by medicine. °· You have bleeding or swelling. °· You are still sleepy or feeling clumsy after 24 hours. °SEEK IMMEDIATE MEDICAL CARE IF: °· You develop a rash. °· You have difficulty breathing. °· You develop any type of allergic problem. °· You have a fever. °MAKE SURE YOU: °· Understand these instructions. °· Will watch your condition. °· Will get help right away if you are not doing well or get worse. °Document Released: 03/01/2013 Document Reviewed: 12/16/2012 °[employer]® Patient Information ©2014 [employer]. ° ° °

## 2013-08-21 ENCOUNTER — Encounter (HOSPITAL_COMMUNITY): Payer: Self-pay | Admitting: Gastroenterology

## 2013-10-10 ENCOUNTER — Ambulatory Visit (INDEPENDENT_AMBULATORY_CARE_PROVIDER_SITE_OTHER): Payer: BC Managed Care – PPO | Admitting: Family Medicine

## 2013-10-10 ENCOUNTER — Encounter: Payer: Self-pay | Admitting: Family Medicine

## 2013-10-10 VITALS — BP 124/55 | HR 65 | Temp 98.2°F | Ht 67.0 in | Wt 195.0 lb

## 2013-10-10 DIAGNOSIS — R5383 Other fatigue: Secondary | ICD-10-CM | POA: Insufficient documentation

## 2013-10-10 DIAGNOSIS — R5381 Other malaise: Secondary | ICD-10-CM

## 2013-10-10 DIAGNOSIS — I10 Essential (primary) hypertension: Secondary | ICD-10-CM

## 2013-10-10 LAB — TSH: TSH: 1.12 u[IU]/mL (ref 0.350–4.500)

## 2013-10-10 LAB — POCT HEMOGLOBIN: HEMOGLOBIN: 12.6 g/dL — AB (ref 14.1–18.1)

## 2013-10-10 NOTE — Assessment & Plan Note (Signed)
Unsure of etiology, but could be related to sleep vs anemia. Don't suspect malignancy, although patient being treated for prostate cancer. Will obtain hemoglobin and TSH today. Counseled patient on good sleep habits. Will follow-up if continues to be an issue.

## 2013-10-10 NOTE — Progress Notes (Signed)
   Subjective:    Patient ID: Justin Ayala, male    DOB: 1950-06-27, 63 y.o.   MRN: [id number]  HPI  Patient presents today with follow-up of hypertension and a new complaint of fatigue. This has been going on for about one month and has improved overall. Patient recently had an episode of rectal bleeding that has resolved and managed by cauterization in April 2015. Last hemoglobin of 12.6. Has not had any recurrent bleeding. Gets about 5 hours of sleep daily, which is normal for him. No recent weight loss. Has tried B12 and B6 vitamins.  Review of Systems  Constitutional: Positive for fatigue.  Respiratory: Negative for cough and shortness of breath.   Cardiovascular: Negative for chest pain.  Skin: Negative for pallor.       Objective:   Physical Exam  Constitutional: He is oriented to person, place, and time. He appears well-developed and well-nourished.  Eyes: Conjunctivae and EOM are normal.  Neck: No thyromegaly present.  Cardiovascular: Normal rate and regular rhythm.   Pulmonary/Chest: Effort normal.  Lymphadenopathy:    He has no cervical adenopathy.  Neurological: He is alert and oriented to person, place, and time.  Skin: Skin is warm and dry.        Assessment & Plan:

## 2013-10-10 NOTE — Assessment & Plan Note (Signed)
Blood pressure on the low side today. Will continue therapy and have patient check his blood pressure at home. If continues to be low, patient is instructed to call me. At that point, will likely discontinue lisinopril.

## 2013-10-10 NOTE — Patient Instructions (Addendum)
Justin Ayala, it was a pleasure seeing you today. Today we talked about your fatigue. A few things could be causing your symptoms. I want to get your blood count today. I think it is also important that you have a more consistent regimen for your sleep. If possible, try to get 8 hours of sleep daily. Please also check your blood pressure at home daily. If your second value continues to be less than 60, please call me so I can adjust your medication.  Please make an appointment for 4 weeks  If you have any questions or concerns, please do not hesitate to call the office at [phone].  Sincerely,  Cordelia Poche, MD

## 2013-10-11 ENCOUNTER — Telehealth: Payer: Self-pay | Admitting: *Deleted

## 2013-10-11 NOTE — Telephone Encounter (Signed)
Spoke with patient and informed him of below results 

## 2013-10-11 NOTE — Telephone Encounter (Signed)
Message copied by Johny Shears on Wed Oct 11, 2013  8:35 AM ------      Message from: Cordelia Poche A      Created: Tue Oct 10, 2013  7:41 PM       Please inform patient that hemoglobin is stable and thyroid level is normal. He is still to call me back if his blood pressures continue to be low. Hopefully changing sleeping habits will improve his symptoms of fatigue. ------

## 2014-01-18 ENCOUNTER — Other Ambulatory Visit: Payer: Self-pay | Admitting: Family Medicine

## 2014-01-22 ENCOUNTER — Other Ambulatory Visit: Payer: Self-pay | Admitting: Family Medicine

## 2014-01-24 ENCOUNTER — Other Ambulatory Visit: Payer: Self-pay | Admitting: *Deleted

## 2014-02-06 ENCOUNTER — Encounter: Payer: Self-pay | Admitting: Cardiovascular Disease

## 2014-02-06 ENCOUNTER — Ambulatory Visit (INDEPENDENT_AMBULATORY_CARE_PROVIDER_SITE_OTHER): Payer: BC Managed Care – PPO | Admitting: Cardiovascular Disease

## 2014-02-06 VITALS — BP 130/70 | HR 69 | Ht 67.0 in | Wt 189.0 lb

## 2014-02-06 DIAGNOSIS — I2510 Atherosclerotic heart disease of native coronary artery without angina pectoris: Secondary | ICD-10-CM

## 2014-02-06 DIAGNOSIS — I10 Essential (primary) hypertension: Secondary | ICD-10-CM

## 2014-02-06 DIAGNOSIS — E785 Hyperlipidemia, unspecified: Secondary | ICD-10-CM

## 2014-02-06 NOTE — Patient Instructions (Signed)
Dr Berry wants you to follow-up in 1 year . You will receive a reminder letter in the mail two months in advance. If you don't receive a letter, please call our office to schedule the follow-up appointment. 

## 2014-02-06 NOTE — Assessment & Plan Note (Signed)
On statin therapy followed by his PCP 

## 2014-02-06 NOTE — Progress Notes (Signed)
02/06/2014 Justin Ayala   28-Apr-1951  [id number]  Primary Physician Cordelia Poche, MD Primary Cardiologist: Lorretta Harp MD [name]   HPI:  The patient is a 63 year old mildly overweight married [name], father of 72, grandfather to 4 grandchildren, who I saw a year ago. He has a history of moderate, but not critical, CAD by catheterization, which I performed Oct 12, 2008. He had a 60% to 70% proximal first diagonal branch stenosis. He had 50% distal dominant circumflex stenosis in the PDA with normal LV function. His other problems include hypertension and hyperlipidemia. He does not smoke. He has been exercising more recently. He has had a gastric polyp in the past, found in the setting of a GI workup for GI bleed. He was transfused at that time.his lipid profile followed by his primary care physician. He denies chest pain or shortness of breath.   Current Outpatient Prescriptions  Medication Sig Dispense Refill  . aspirin EC 81 MG tablet TAKE 1 BY MOUTH EVERY MORNING  90 tablet  0  . atorvastatin (LIPITOR) 40 MG tablet Take 1 tablet (40 mg total) by mouth daily.  30 tablet  0  . Cranberry-Vitamin C-Vitamin E (CRANBERRY PLUS VITAMIN C PO) Take 1,500 mg by mouth daily.      . ferrous sulfate 325 (65 FE) MG tablet Take 1 tablet (325 mg total) by mouth daily.  90 tablet  3  . Ginkgo Biloba 40 MG TABS Take 60 mg by mouth daily.      . Glucosamine HCl 1000 MG TABS Take 1 tablet by mouth daily.      . hydrochlorothiazide (HYDRODIURIL) 25 MG tablet TAKE 1 BY MOUTH DAILY (GENERIC HYDRODIURIL)  90 tablet  0  . hydrocortisone 1 % ointment Apply 1 application topically as needed. Apply to rash on leg      . [place] Ginseng 100 MG CAPS Take 100 mg by mouth daily.      [name] lisinopril (PRINIVIL,ZESTRIL) 10 MG tablet Take 5 mg by mouth daily.      . meloxicam (MOBIC) 7.5 MG tablet Take 7.5 mg by mouth 2 (two) times daily.      . metoprolol tartrate (LOPRESSOR) 25 MG  tablet TAKE 1/2 BY MOUTH TWICE DAILY (GENERIC LOPRESSOR)  90 tablet  0  . NASONEX 50 MCG/ACT nasal spray USE 2 SPRAYS NASALLY ONCE A DAY AS DIRECTED  17 g  0  . pantoprazole (PROTONIX) 40 MG tablet TAKE 1 BY MOUTH DAILY  90 tablet  0  . tadalafil (CIALIS) 5 MG tablet Take 5 mg by mouth daily as needed for erectile dysfunction.      . tamsulosin (FLOMAX) 0.4 MG CAPS capsule TAKE 1 BY MOUTH TWICE DAILY  180 capsule  0  . vitamin B-12 (CYANOCOBALAMIN) 1000 MCG tablet Take 1,000 mcg by mouth daily.      . vitamin E 400 UNIT capsule Take 400 Units by mouth daily.      [name] ZETIA 10 MG tablet TAKE 1 BY MOUTH DAILY  90 tablet  0   No current facility-administered medications for this visit.    Allergies  Allergen Reactions  . Ace Inhibitors Cough    History   Social History  . Marital Status: Married    Spouse Name: N/A    Number of Children: 3  . Years of Education: BS    Occupational History  .     Social History Main Topics  . Smoking status: Never  Smoker   . Smokeless tobacco: Not on file  . Alcohol Use: No  . Drug Use: No  . Sexual Activity: Yes    Partners: Female   Other Topics Concern  . Not on file   Social History Narrative   married x 30+years; 3 children, 4 grandsons    no smoking or ETOH;    Retired Merchant navy officer formerly at [employer];    likes to golf (golfs 3-4 times a week); no exercise and rides cart while golfing           Review of Systems: General: negative for chills, fever, night sweats or weight changes.  Cardiovascular: negative for chest pain, dyspnea on exertion, edema, orthopnea, palpitations, paroxysmal nocturnal dyspnea or shortness of breath Dermatological: negative for rash Respiratory: negative for cough or wheezing Urologic: negative for hematuria Abdominal: negative for nausea, vomiting, diarrhea, bright red blood per rectum, melena, or hematemesis Neurologic: negative for visual changes, syncope, or dizziness All other systems  reviewed and are otherwise negative except as noted above.    Blood pressure 130/70, pulse 69, height 5\' 7"  (1.702 m), weight 189 lb (85.73 kg).  General appearance: alert and no distress Neck: no adenopathy, no carotid bruit, no JVD, supple, symmetrical, trachea midline and thyroid not enlarged, symmetric, no tenderness/mass/nodules Lungs: clear to auscultation bilaterally Heart: regular rate and rhythm, S1, S2 normal, no murmur, click, rub or gallop Extremities: extremities normal, atraumatic, no cyanosis or edema  EKG sinus rhythm at 69 without ST or T wave changes  ASSESSMENT AND PLAN:   HYPERLIPIDEMIA On statin therapy followed by his PCP  HYPERTENSION, BENIGN SYSTEMIC Controlled on current medications  CAD (coronary artery disease) History of CAD status post cardiac catheterization performed by myself 07/15/08 revealing a 60-70% proximal diagonal branch stenosis and scattered 30-50% stenoses in the mid to distal dominant circumflex with normal LV function. He denies chest pain or shortness of breath.      Lorretta Harp MD FACP,FACC,FAHA, [facility] 02/06/2014 5:02 PM

## 2014-02-06 NOTE — Assessment & Plan Note (Signed)
History of CAD status post cardiac catheterization performed by myself 07/15/08 revealing a 60-70% proximal diagonal branch stenosis and scattered 30-50% stenoses in the mid to distal dominant circumflex with normal LV function. He denies chest pain or shortness of breath.

## 2014-02-06 NOTE — Assessment & Plan Note (Signed)
Controlled on current medications 

## 2014-02-13 ENCOUNTER — Ambulatory Visit: Payer: BC Managed Care – PPO | Admitting: Cardiovascular Disease

## 2014-03-06 ENCOUNTER — Telehealth: Payer: Self-pay | Admitting: Family Medicine

## 2014-03-06 MED ORDER — LISINOPRIL 10 MG PO TABS: 5.0000 mg | ORAL_TABLET | Freq: Every day | ORAL | Status: DC

## 2014-03-06 NOTE — Telephone Encounter (Signed)
Pt called and is requesting a refill on his lisinopril. He said that the pharmacy has sent three request to [place]. jw

## 2014-03-06 NOTE — Telephone Encounter (Signed)
Unsure of where these refills are going, however, this is the first I've heard about a refill request. Will send prescription refill.

## 2014-03-07 NOTE — Telephone Encounter (Signed)
Spoke with patient and informed him of below 

## 2014-03-26 ENCOUNTER — Other Ambulatory Visit: Payer: Self-pay | Admitting: Family Medicine

## 2014-04-09 ENCOUNTER — Ambulatory Visit: Payer: BC Managed Care – PPO | Admitting: Internal Medicine

## 2014-05-08 ENCOUNTER — Other Ambulatory Visit: Payer: Self-pay | Admitting: *Deleted

## 2014-05-08 ENCOUNTER — Other Ambulatory Visit: Payer: Self-pay | Admitting: Family Medicine

## 2014-05-08 NOTE — Telephone Encounter (Signed)
Needs refills on all meds but lisinonpril. This is a mail order refills with [facility]. Please make sure they are for 90 days because pt pays the same amt for 30 days and 90 days. Lipitor: will be out of the it before he gets the mail order refill back. Can he get a weeks worth at [facility] on [address]? Please advise

## 2014-05-08 NOTE — Telephone Encounter (Signed)
Lipitor: will be out of the it before he gets the mail order refill back. Can he get [facility] weeks worth at [facility] [address]?

## 2014-05-11 MED ORDER — VITAMIN B-12 1000 MCG PO TABS: 1000.0000 ug | ORAL_TABLET | Freq: Every day | ORAL | Status: DC

## 2014-05-11 MED ORDER — GINKGO BILOBA 40 MG PO TABS: 60.0000 mg | ORAL_TABLET | Freq: Every day | ORAL | Status: DC

## 2014-05-11 MED ORDER — HYDROCHLOROTHIAZIDE 25 MG PO TABS: 25.0000 mg | ORAL_TABLET | Freq: Every day | ORAL | Status: DC

## 2014-05-11 MED ORDER — MELOXICAM 7.5 MG PO TABS: 7.5000 mg | ORAL_TABLET | Freq: Two times a day (BID) | ORAL | Status: DC

## 2014-05-11 MED ORDER — METOPROLOL TARTRATE 25 MG PO TABS: 12.5000 mg | ORAL_TABLET | Freq: Two times a day (BID) | ORAL | Status: DC

## 2014-05-11 MED ORDER — FERROUS SULFATE 325 (65 FE) MG PO TABS: 325.0000 mg | ORAL_TABLET | Freq: Every day | ORAL | Status: AC

## 2014-05-11 MED ORDER — ASPIRIN EC 81 MG PO TBEC: 81.0000 mg | DELAYED_RELEASE_TABLET | Freq: Every day | ORAL | Status: DC

## 2014-05-11 MED ORDER — TADALAFIL 5 MG PO TABS: 5.0000 mg | ORAL_TABLET | Freq: Every day | ORAL | Status: DC | PRN

## 2014-05-11 MED ORDER — PANTOPRAZOLE SODIUM 40 MG PO TBEC: 40.0000 mg | DELAYED_RELEASE_TABLET | Freq: Every day | ORAL | Status: DC

## 2014-05-11 MED ORDER — EZETIMIBE 10 MG PO TABS: 10.0000 mg | ORAL_TABLET | Freq: Every day | ORAL | Status: DC

## 2014-05-11 MED ORDER — KOREAN GINSENG 100 MG PO CAPS: 100.0000 mg | ORAL_CAPSULE | Freq: Every day | ORAL | Status: AC

## 2014-05-11 MED ORDER — GLUCOSAMINE HCL 1000 MG PO TABS: 1.0000 | ORAL_TABLET | Freq: Every day | ORAL | Status: DC

## 2014-05-11 MED ORDER — TAMSULOSIN HCL 0.4 MG PO CAPS: ORAL_CAPSULE | ORAL | Status: DC

## 2014-05-11 MED ORDER — MOMETASONE FUROATE 50 MCG/ACT NA SUSP: NASAL | Status: DC

## 2014-05-11 MED ORDER — ATORVASTATIN CALCIUM 40 MG PO TABS: 40.0000 mg | ORAL_TABLET | Freq: Every day | ORAL | Status: DC

## 2014-05-11 MED ORDER — VITAMIN E 180 MG (400 UNIT) PO CAPS: 400.0000 [IU] | ORAL_CAPSULE | Freq: Every day | ORAL | Status: DC

## 2014-05-30 ENCOUNTER — Other Ambulatory Visit (INDEPENDENT_AMBULATORY_CARE_PROVIDER_SITE_OTHER): Payer: BLUE CROSS/BLUE SHIELD

## 2014-05-30 ENCOUNTER — Encounter: Payer: Self-pay | Admitting: Internal Medicine

## 2014-05-30 ENCOUNTER — Ambulatory Visit (INDEPENDENT_AMBULATORY_CARE_PROVIDER_SITE_OTHER): Payer: BLUE CROSS/BLUE SHIELD | Admitting: Internal Medicine

## 2014-05-30 VITALS — BP 128/78 | HR 78 | Temp 98.0°F | Resp 12 | Ht 67.0 in | Wt 194.0 lb

## 2014-05-30 DIAGNOSIS — R5383 Other fatigue: Secondary | ICD-10-CM

## 2014-05-30 DIAGNOSIS — E663 Overweight: Secondary | ICD-10-CM

## 2014-05-30 DIAGNOSIS — I10 Essential (primary) hypertension: Secondary | ICD-10-CM

## 2014-05-30 DIAGNOSIS — D509 Iron deficiency anemia, unspecified: Secondary | ICD-10-CM

## 2014-05-30 LAB — CBC
HCT: 39.7 % (ref 39.0–52.0)
HEMOGLOBIN: 12.9 g/dL — AB (ref 13.0–17.0)
MCHC: 32.5 g/dL (ref 30.0–36.0)
MCV: 86.3 fl (ref 78.0–100.0)
Platelets: 268 10*3/uL (ref 150.0–400.0)
RBC: 4.59 Mil/uL (ref 4.22–5.81)
RDW: 13.9 % (ref 11.5–15.5)
WBC: 6 10*3/uL (ref 4.0–10.5)

## 2014-05-30 LAB — COMPREHENSIVE METABOLIC PANEL
ALT: 251 U/L — ABNORMAL HIGH (ref 0–53)
AST: 234 U/L — AB (ref 0–37)
Albumin: 4.2 g/dL (ref 3.5–5.2)
Alkaline Phosphatase: 59 U/L (ref 39–117)
BUN: 18 mg/dL (ref 6–23)
CO2: 27 mEq/L (ref 19–32)
Calcium: 9.3 mg/dL (ref 8.4–10.5)
Chloride: 106 mEq/L (ref 96–112)
Creatinine, Ser: 1 mg/dL (ref 0.4–1.5)
GFR: 102.16 mL/min (ref >60.00)
Glucose, Bld: 116 mg/dL — ABNORMAL HIGH (ref 70–99)
Potassium: 3.8 mEq/L (ref 3.5–5.1)
Sodium: 140 mEq/L (ref 135–145)
TOTAL PROTEIN: 7.1 g/dL (ref 6.0–8.3)
Total Bilirubin: 0.6 mg/dL (ref 0.2–1.2)

## 2014-05-30 LAB — IRON AND TIBC
%SAT: 24 % (ref 20–55)
IRON: 78 ug/dL (ref 42–165)
TIBC: 321 ug/dL (ref 215–435)
UIBC: 243 ug/dL (ref 125–400)

## 2014-05-30 LAB — FOLATE: Folate: >24.6 ng/mL (ref >5.9)

## 2014-05-30 LAB — FERRITIN: Ferritin: 29.7 ng/mL (ref 22.0–322.0)

## 2014-05-30 LAB — LIPID PANEL
CHOL/HDL RATIO: 4
Cholesterol: 144 mg/dL (ref 0–200)
HDL: 36.2 mg/dL — ABNORMAL LOW (ref >39.00)
NonHDL: 107.8
Triglycerides: 213 mg/dL — ABNORMAL HIGH (ref 0.0–149.0)
VLDL: 42.6 mg/dL — ABNORMAL HIGH (ref 0.0–40.0)

## 2014-05-30 LAB — LDL CHOLESTEROL, DIRECT: Direct LDL: 84.3 mg/dL

## 2014-05-30 LAB — VITAMIN B12: Vitamin B-12: 1147 pg/mL — ABNORMAL HIGH (ref 211–911)

## 2014-05-30 NOTE — Progress Notes (Signed)
Pre visit review using our clinic review tool, if applicable. No additional management support is needed unless otherwise documented below in the visit note. 

## 2014-05-30 NOTE — Patient Instructions (Signed)
We will check your blood work to find out why you are so tired. We will call you back with the results and let you know how or if this changes anything.   We will see you back in about 6 months if things are going well and sooner if you are having any new problems or questions.   Fatigue Fatigue is a feeling of tiredness, lack of energy, lack of motivation, or feeling tired all the time. Having enough rest, good nutrition, and reducing stress will normally reduce fatigue. Consult your caregiver if it persists. The nature of your fatigue will help your caregiver to find out its cause. The treatment is based on the cause.  CAUSES  There are many causes for fatigue. Most of the time, fatigue can be traced to one or more of your habits or routines. Most causes fit into one or more of three general areas. They are: Lifestyle problems  Sleep disturbances.  Overwork.  Physical exertion.  Unhealthy habits.  Poor eating habits or eating disorders.  Alcohol and/or drug use .  Lack of proper nutrition (malnutrition). Psychological problems  Stress and/or anxiety problems.  Depression.  Grief.  Boredom. Medical Problems or Conditions  Anemia.  Pregnancy.  Thyroid gland problems.  Recovery from major surgery.  Continuous pain.  Emphysema or asthma that is not well controlled  Allergic conditions.  Diabetes.  Infections (such as mononucleosis).  Obesity.  Sleep disorders, such as sleep apnea.  Heart failure or other heart-related problems.  Cancer.  Kidney disease.  Liver disease.  Effects of certain medicines such as antihistamines, cough and cold remedies, prescription pain medicines, heart and blood pressure medicines, drugs used for treatment of cancer, and some antidepressants. SYMPTOMS  The symptoms of fatigue include:   Lack of energy.  Lack of drive (motivation).  Drowsiness.  Feeling of indifference to the surroundings. DIAGNOSIS  The details of  how you feel help guide your caregiver in finding out what is causing the fatigue. You will be asked about your present and past health condition. It is important to review all medicines that you take, including prescription and non-prescription items. A thorough exam will be done. You will be questioned about your feelings, habits, and normal lifestyle. Your caregiver may suggest blood tests, urine tests, or other tests to look for common medical causes of fatigue.  TREATMENT  Fatigue is treated by correcting the underlying cause. For example, if you have continuous pain or depression, treating these causes will improve how you feel. Similarly, adjusting the dose of certain medicines will help in reducing fatigue.  HOME CARE INSTRUCTIONS   Try to get the required amount of good sleep every night.  Eat a healthy and nutritious diet, and drink enough water throughout the day.  Practice ways of relaxing (including yoga or meditation).  Exercise regularly.  Make plans to change situations that cause stress. Act on those plans so that stresses decrease over time. Keep your work and personal routine reasonable.  Avoid street drugs and minimize use of alcohol.  Start taking a daily multivitamin after consulting your caregiver. SEEK MEDICAL CARE IF:   You have persistent tiredness, which cannot be accounted for.  You have fever.  You have unintentional weight loss.  You have headaches.  You have disturbed sleep throughout the night.  You are feeling sad.  You have constipation.  You have dry skin.  You have gained weight.  You are taking any new or different medicines that you suspect  are causing fatigue.  You are unable to sleep at night.  You develop any unusual swelling of your legs or other parts of your body. SEEK IMMEDIATE MEDICAL CARE IF:   You are feeling confused.  Your vision is blurred.  You feel faint or pass out.  You develop severe headache.  You develop  severe abdominal, pelvic, or back pain.  You develop chest pain, shortness of breath, or an irregular or fast heartbeat.  You are unable to pass a normal amount of urine.  You develop abnormal bleeding such as bleeding from the rectum or you vomit blood.  You have thoughts about harming yourself or committing suicide.  You are worried that you might harm someone else. MAKE SURE YOU:   Understand these instructions.  Will watch your condition.  Will get help right away if you are not doing well or get worse. Document Released: 03/08/2007 Document Revised: 08/03/2011 Document Reviewed: 09/12/2013 [facility] Patient Information 2015 [place], [place]. This information is not intended to replace advice given to you by your health care provider. Make sure you discuss any questions you have with your health care provider.

## 2014-05-31 ENCOUNTER — Other Ambulatory Visit: Payer: Self-pay | Admitting: Internal Medicine

## 2014-05-31 DIAGNOSIS — R748 Abnormal levels of other serum enzymes: Secondary | ICD-10-CM

## 2014-05-31 LAB — TESTOSTERONE, FREE, TOTAL, SHBG
SEX HORMONE BINDING: 34 nmol/L (ref 22–77)
Testosterone, Free: 25.2 pg/mL — ABNORMAL LOW (ref 47.0–244.0)
Testosterone-% Free: 1.8 % (ref 1.6–2.9)
Testosterone: 138 ng/dL — ABNORMAL LOW (ref 300–890)

## 2014-05-31 NOTE — Assessment & Plan Note (Signed)
Not currently taking any vitamins or supplements. Check anemia panel and CBC. Still low at last recheck after episode of GI bleeding. Denies current bleeding episodes or overt blood loss.

## 2014-05-31 NOTE — Assessment & Plan Note (Signed)
BP at goal today. Check BMP and change therapy as needed.

## 2014-05-31 NOTE — Assessment & Plan Note (Signed)
Does not currently have the stamina to exercise but he states that he will try to get back at it.

## 2014-05-31 NOTE — Assessment & Plan Note (Addendum)
Check CBC, anemia panel seems most likely etiology. In general he does have some deconditioning. Not tired or sleepy throughout the day making OSA less likely although still a consideration if other testing normal. Check CMP for kidney or liver dysfunction.

## 2014-05-31 NOTE — Progress Notes (Signed)
   Subjective:    Patient ID: Justin Ayala, male    DOB: 1950/07/01, 64 y.o.   MRN: [id number]  HPI The patient is a 64 YO man who is coming in today to establish care. His main concern is fatigue. He does have PMH of GI bleeding last year most recent, anemia (not checked recently), hyperlipidemia, hx prostate cancer, hypertension. It is thought that his GI bleeding is from AVMs. He notes that more tiredness with activity but denies sleepiness and wakes feeling rested. He is not exercising lately and stopped going to the gym due to fatigue. He denies dyspnea with exertion or while sleeping or lying down. He denies chest pains, SOB, abdominal pains. He denies any bleeding or blood in his stools. He denies joint pain or swelling.   Review of Systems  Constitutional: Positive for activity change and fatigue. Negative for fever, chills, appetite change and unexpected weight change.  HENT: Negative.   Respiratory: Negative for cough, chest tightness, shortness of breath and wheezing.   Cardiovascular: Negative for chest pain, palpitations and leg swelling.  Gastrointestinal: Negative for abdominal pain, diarrhea, constipation, blood in stool and abdominal distention.  Musculoskeletal: Negative for myalgias, back pain, arthralgias and gait problem.  Skin: Negative.   Neurological: Negative for dizziness, syncope, weakness, light-headedness and headaches.  Psychiatric/Behavioral: Negative.       Objective:   Physical Exam  Constitutional: He is oriented to person, place, and time. He appears well-developed and well-nourished.  Overweight  HENT:  Head: Normocephalic and atraumatic.  Eyes: EOM are normal.  Neck: Normal range of motion.  Cardiovascular: Normal rate and regular rhythm.   Pulmonary/Chest: Effort normal and breath sounds normal. No respiratory distress. He has no wheezes. He has no rales.  Abdominal: Soft. Bowel sounds are normal. He exhibits no distension. There is no tenderness.  There is no rebound.  Musculoskeletal: He exhibits no edema.  Neurological: He is alert and oriented to person, place, and time. Coordination normal.  Skin: Skin is warm and dry.   Filed Vitals:   05/30/14 1016  BP: 128/78  Pulse: 78  Temp: 98 F (36.7 C)  TempSrc: Oral  Resp: 12  Height: 5\' 7"  (1.702 m)  Weight: 194 lb (87.998 kg)  SpO2: 97%      Assessment & Plan:

## 2014-06-07 ENCOUNTER — Telehealth: Payer: Self-pay | Admitting: Internal Medicine

## 2014-06-07 NOTE — Telephone Encounter (Signed)
Spoke with patient.

## 2014-06-07 NOTE — Telephone Encounter (Signed)
Patient is requesting return call on results

## 2014-06-08 ENCOUNTER — Other Ambulatory Visit: Payer: BLUE CROSS/BLUE SHIELD

## 2014-06-08 DIAGNOSIS — R748 Abnormal levels of other serum enzymes: Secondary | ICD-10-CM

## 2014-06-09 LAB — HEPATITIS PANEL, ACUTE
HCV AB: NEGATIVE
HEP A IGM: NONREACTIVE
HEP B C IGM: NONREACTIVE
HEP B S AG: NEGATIVE

## 2014-06-12 ENCOUNTER — Other Ambulatory Visit: Payer: Self-pay | Admitting: *Deleted

## 2014-06-13 MED ORDER — ATORVASTATIN CALCIUM 40 MG PO TABS: 40.0000 mg | ORAL_TABLET | Freq: Every day | ORAL | Status: DC

## 2014-07-02 ENCOUNTER — Other Ambulatory Visit: Payer: BLUE CROSS/BLUE SHIELD

## 2014-07-02 ENCOUNTER — Other Ambulatory Visit: Payer: Self-pay | Admitting: Geriatric Medicine

## 2014-07-02 DIAGNOSIS — E349 Endocrine disorder, unspecified: Secondary | ICD-10-CM

## 2014-07-06 ENCOUNTER — Other Ambulatory Visit: Payer: Self-pay | Admitting: Family Medicine

## 2014-07-06 LAB — TESTOSTERONE,FREE AND TOTAL: Testosterone: 172 ng/dL — ABNORMAL LOW (ref 348–1197)

## 2014-07-08 ENCOUNTER — Other Ambulatory Visit: Payer: Self-pay | Admitting: Family Medicine

## 2014-07-09 ENCOUNTER — Telehealth: Payer: Self-pay | Admitting: Geriatric Medicine

## 2014-07-09 ENCOUNTER — Other Ambulatory Visit: Payer: Self-pay | Admitting: Internal Medicine

## 2014-07-09 DIAGNOSIS — R7989 Other specified abnormal findings of blood chemistry: Secondary | ICD-10-CM

## 2014-07-09 NOTE — Telephone Encounter (Signed)
Patient would like to go to endocrinology for his low testosterone levels. Would you please put orders in, thanks?

## 2014-07-10 ENCOUNTER — Telehealth: Payer: Self-pay | Admitting: *Deleted

## 2014-07-10 NOTE — Telephone Encounter (Signed)
Will change PCP in Epic to Dr. Doug Sou since patient has transferred his care.  Burna Forts, BSN, RN-BC

## 2014-07-10 NOTE — Telephone Encounter (Signed)
Spoke with patient and he has confirmed that he does go to [facility] now

## 2014-07-10 NOTE — Telephone Encounter (Signed)
-----   Message from Cordelia Poche, MD sent at 07/09/2014  4:23 PM EST ----- Regarding: Call patient, please re: PCP Patient seems to have established with [facility]. Can you please confirm this with the patient? Thanks!

## 2014-07-11 ENCOUNTER — Other Ambulatory Visit: Payer: Self-pay | Admitting: Geriatric Medicine

## 2014-07-11 MED ORDER — ATORVASTATIN CALCIUM 40 MG PO TABS: 40.0000 mg | ORAL_TABLET | Freq: Every day | ORAL | Status: DC

## 2014-07-16 ENCOUNTER — Other Ambulatory Visit: Payer: Self-pay | Admitting: Family Medicine

## 2014-07-24 ENCOUNTER — Other Ambulatory Visit: Payer: Self-pay | Admitting: Geriatric Medicine

## 2014-07-24 MED ORDER — TAMSULOSIN HCL 0.4 MG PO CAPS: ORAL_CAPSULE | ORAL | Status: DC

## 2014-07-25 ENCOUNTER — Encounter: Payer: Self-pay | Admitting: Endocrinology

## 2014-07-25 ENCOUNTER — Ambulatory Visit (INDEPENDENT_AMBULATORY_CARE_PROVIDER_SITE_OTHER): Payer: BLUE CROSS/BLUE SHIELD | Admitting: Endocrinology

## 2014-07-25 VITALS — BP 125/71 | HR 62 | Temp 98.3°F | Resp 14 | Ht 67.0 in | Wt 190.8 lb

## 2014-07-25 DIAGNOSIS — E291 Testicular hypofunction: Secondary | ICD-10-CM

## 2014-07-25 DIAGNOSIS — E049 Nontoxic goiter, unspecified: Secondary | ICD-10-CM

## 2014-07-25 DIAGNOSIS — R945 Abnormal results of liver function studies: Secondary | ICD-10-CM

## 2014-07-25 DIAGNOSIS — R7989 Other specified abnormal findings of blood chemistry: Secondary | ICD-10-CM

## 2014-07-25 LAB — T4, FREE: Free T4: 0.99 ng/dL (ref 0.60–1.60)

## 2014-07-25 LAB — COMPREHENSIVE METABOLIC PANEL
ALBUMIN: 4.4 g/dL (ref 3.5–5.2)
ALK PHOS: 51 U/L (ref 39–117)
ALT: 315 U/L — ABNORMAL HIGH (ref 0–53)
AST: 231 U/L — ABNORMAL HIGH (ref 0–37)
BILIRUBIN TOTAL: 0.3 mg/dL (ref 0.2–1.2)
BUN: 24 mg/dL — AB (ref 6–23)
CO2: 29 mEq/L (ref 19–32)
CREATININE: 0.89 mg/dL (ref 0.40–1.50)
Calcium: 9.6 mg/dL (ref 8.4–10.5)
Chloride: 105 mEq/L (ref 96–112)
GFR: 110.09 mL/min (ref >60.00)
GLUCOSE: 95 mg/dL (ref 70–99)
POTASSIUM: 3.6 meq/L (ref 3.5–5.1)
Sodium: 138 mEq/L (ref 135–145)
Total Protein: 7.4 g/dL (ref 6.0–8.3)

## 2014-07-25 LAB — TSH: TSH: 2.08 u[IU]/mL (ref 0.35–4.50)

## 2014-07-25 LAB — LUTEINIZING HORMONE: LH: 9.54 m[IU]/mL — AB (ref 1.50–9.30)

## 2014-07-25 NOTE — Patient Instructions (Signed)
Stop Ginseng etc

## 2014-07-25 NOTE — Progress Notes (Signed)
Patient ID: Justin Ayala, male   DOB: 05-27-1950, 64 y.o.   MRN: [id number]          Chief complaint: Tiredness and weakness  History of Present Illness  He says that for the last year or so he has been getting more tired as well as weaker. He has been having difficulty with daily activities and exercising because of feeling tired and weak He thinks that he has difficulty bending down and getting up as well as going upstairs.  Also feels some weakness in his arms also. He had presented to his primary care doctor with symptoms of fatigue and was evaluated with testosterone levels which were low He does not complain of any decreased libido, increased motivation, depression or hot flashes. However with starting to go to the gym and doing muscle toning exercises he thinks he is feeling a little better and his muscle mass is improving   There is no history of the following: long term anabolic steroid use, history of testicular injury mumps in childhood. No history of osteopenia or low impact fracture  Prior lab results show baselinefree testosterone level of 25.2, normal >47.  This was checked at 3 PM  Repeat total testosterone done at 11 AM was also low.  Lab Results  Component Value Date   TESTOSTERONE 172* 07/02/2014      He is now referred here for further management      Medication List       This list is accurate as of: 07/25/14 11:25 AM.  Always use your most recent med list.               aspirin EC 81 MG tablet  Take 1 tablet (81 mg total) by mouth daily.     atorvastatin 40 MG tablet  Commonly known as:  LIPITOR  Take 1 tablet (40 mg total) by mouth daily at 6 PM.     b complex vitamins tablet  Take 1 tablet by mouth daily.     CRANBERRY PLUS VITAMIN C PO  Take 1,500 mg by mouth daily.     ezetimibe 10 MG tablet  Commonly known as:  ZETIA  Take 1 tablet (10 mg total) by mouth daily.     ferrous sulfate 325 (65 FE) MG tablet  Take 1 tablet (325  mg total) by mouth daily.     Ginkgo Biloba 40 MG Tabs  Take 1.5 tablets (60 mg total) by mouth daily.     Glucosamine HCl 1000 MG Tabs  Take 1 tablet (1,000 mg total) by mouth daily.     hydrochlorothiazide 25 MG tablet  Commonly known as:  HYDRODIURIL  Take 1 tablet (25 mg total) by mouth daily.     [name] 100 MG Caps  Take 1 capsule (100 mg total) by mouth daily.     lisinopril 10 MG tablet  Commonly known as:  PRINIVIL,ZESTRIL  Take 0.5 tablets (5 mg total) by mouth daily.     meloxicam 7.5 MG tablet  Commonly known as:  MOBIC     metoprolol tartrate 25 MG tablet  Commonly known as:  LOPRESSOR  Take 0.5 tablets (12.5 mg total) by mouth 2 (two) times daily.     mometasone 50 MCG/ACT nasal spray  Commonly known as:  NASONEX  USE 2 SPRAYS NASALLY ONCE A DAY AS DIRECTED     Omega 3 1000 MG Caps  Take by mouth. One daily     pantoprazole 40 MG tablet  Commonly known as:  PROTONIX  Take 1 tablet (40 mg total) by mouth daily.     pyridOXINE 100 MG tablet  Commonly known as:  VITAMIN B-6  Take 100 mg by mouth daily.     tadalafil 5 MG tablet  Commonly known as:  CIALIS  Take 1 tablet (5 mg total) by mouth daily as needed for erectile dysfunction.     tamsulosin 0.4 MG Caps capsule  Commonly known as:  FLOMAX  TAKE 1 BY MOUTH TWICE DAILY     vitamin B-12 1000 MCG tablet  Commonly known as:  CYANOCOBALAMIN  Take 1 tablet (1,000 mcg total) by mouth daily.     Vitamin D-3 1000 UNITS Caps  Take by mouth. Take one daily        Allergies:  Allergies  Allergen Reactions  . Ace Inhibitors Cough    Past Medical History  Diagnosis Date  . HTN (hypertension)   . HLD (hyperlipidemia)   . GERD (gastroesophageal reflux disease)   . Overweight(278.02)   . Allergic rhinitis   . CAD (coronary artery disease)     60-70% prox 1st diagonal branch stenosis, 50% dominant circumflex stenosis in PDA, normal LV function (by 10/12/2008 cath)  . Prostate cancer   .  Kidney stones   . Peptic ulcer   . Anemia 2010  . History of cardiovascular stress test 10/02/2008    exercise tolerance test - abnormal test - subsequent cath     Past Surgical History  Procedure Laterality Date  . Cardiac catheterization  2010    non-critical stenosis  . Appendectomy  12/2008    Dr. Clyda Greener  . Tonsillectomy    . Biopsy prostate  03/19/11    gleason 3+3=6, volume 34 cc, psa 01/06/11 5.00  . Radioactive seed implant  08/14/2011    Procedure: RADIOACTIVE SEED IMPLANT;  Surgeon: Claybon Jabs, MD;  Location: [facility];  Service: Urology;  Laterality: N/A;  6  SEEDS IMPLANTED IN PROSTATE  . Flexible sigmoidoscopy N/A 08/18/2013    Procedure: FLEXIBLE SIGMOIDOSCOPY;  Surgeon: Beryle Beams, MD;  Location: [facility];  Service: Endoscopy;  Laterality: N/A;  . Hot hemostasis N/A 08/18/2013    Procedure: HOT HEMOSTASIS (ARGON PLASMA COAGULATION/BICAP);  Surgeon: Beryle Beams, MD;  Location: [facility];  Service: Endoscopy;  Laterality: N/A;  AVM - sig colon    Family History  Problem Relation Age of Onset  . Hypertension Father     also organ failure  . Stroke Father   . Hypertension Mother   . Cervical cancer Mother   . Prostate cancer Brother     seed implant  . Heart failure Paternal Grandmother   . Heart failure Paternal Grandfather     Social History:  reports that he has never smoked. He does not have any smokeless tobacco history on file. He reports that he does not drink alcohol or use illicit drugs.  Review of Systems  Gastrointestinal: Negative for constipation.  Neurological: Positive for dizziness.       Balance worse   He has not had any significant weight change recently  Wt Readings from Last 3 Encounters:  07/25/14 190 lb 12.8 oz (86.546 kg)  05/30/14 194 lb (87.998 kg)  02/06/14 189 lb (85.73 kg)   No history of unusual headaches  No history of blurred vision including peripheral vision History of hypertension:This  is treated with lisinopril No complaints of palpitations  No history of abnormal fasting glucose  or diabetes; recent lab glucose was nonfasting   He recently had liver function studies which were significantly abnormal, has not had any follow-up.  No etiology of this is clear. He does not have a history of alcohol overuse   He has been taking herbal supplements like Ginseng for several  years, using  various brands.  He started taking these for energy and preventing aging related problems  No complaints of heat or cold intolerance  No complaints of numbness in his feet  Appetite  is variablebut overall fairly good  General Examination:   BP 125/71 mmHg  Pulse 62  Temp(Src) 98.3 F (36.8 C)  Resp 14  Ht 5\' 7"  (1.702 m)  Wt 190 lb 12.8 oz (86.546 kg)  BMI 29.88 kg/m2  SpO2 98%  GENERAL APPEARANCE:averagely built and nourished, pleasant, looks well  SKIN:normal, no rash or pigmentation.  HEENT:Oral mucosa normal. Normal oropharyngeal opening. Has a relatively large fissured tongue EYES:normal external appearance of eyes, Fundii benign.   NECK:no lymphadenopathy, no thyromegaly.  CHEST: Gynecomastia absent LUNGS:clear to auscultation bilaterally, no wheezes, rhonchi, rales.   HEART:normal S1 And S2, no S3, S4, murmur or click.  ABDOMEN:no hepatosplenomegaly, no masses palpated, soft and not tender.   MALE GENITOURINARY:left testicle  3-3.5 cm and right testicle About 4 cm.  Phallus normal  MUSCULOSKELETALNo enlargement or deformity of joints.  EXTREMITIES:no clubbing, no edema.  NEUROLOGIC EXAM: Biceps reflexes normal (2+) bilaterally. He has some difficulty getting up from near squatting position Gait appears normal   Assessment/ Plan:  1. Hypogonadism with symptoms of fatigue and muscle weakness.  He has significantly low testosterone levels including free testosterone.  Currently etiology of this  is unclear.  Has not had any evaluation of his pituitary functions are prolactin level.  He does appear to be symptomatic with this.  On exam he does not have any obvious findings of testicular atrophy or gynecomastia indicating relatively recent onset of the disease.  For now we will need to evaluate further with prolactin and LH levels and if these are normal to consider testosterone supplements. Discussed using a transdermal preparation such as AndroGel as these would give more consistent levels with daily use.  2.  Muscle weakness: Appears to have some proximal muscle weakness in his legs.  This appears out of proportion to his fatigue and not clear if he may have some neurological problem.  3.  Abnormal liver functions: This is a recent finding and not clear if this is related to hepatic disease or a toxic effect.  For now advised him to stop any herbal supplements over-the-counter and follow-up with PCP   [facility] 07/25/2014, 11:25 AM

## 2014-07-26 LAB — PROLACTIN: Prolactin: 9.6 ng/mL (ref 4.0–15.2)

## 2014-07-31 ENCOUNTER — Other Ambulatory Visit: Payer: Self-pay | Admitting: *Deleted

## 2014-07-31 ENCOUNTER — Other Ambulatory Visit: Payer: Self-pay | Admitting: Internal Medicine

## 2014-07-31 ENCOUNTER — Telehealth: Payer: Self-pay | Admitting: Internal Medicine

## 2014-07-31 DIAGNOSIS — R945 Abnormal results of liver function studies: Secondary | ICD-10-CM

## 2014-07-31 DIAGNOSIS — Z0279 Encounter for issue of other medical certificate: Secondary | ICD-10-CM

## 2014-07-31 DIAGNOSIS — R7989 Other specified abnormal findings of blood chemistry: Secondary | ICD-10-CM

## 2014-07-31 MED ORDER — TESTOSTERONE 20.25 MG/1.25GM (1.62%) TD GEL: TRANSDERMAL | Status: DC

## 2014-07-31 NOTE — Telephone Encounter (Signed)
His kidney tests are normal but he needs to come to our lab to check his liver for hepatitis since his liver tests were not normal from Dr. Dwyane Dee. He can come at his convenience.

## 2014-07-31 NOTE — Telephone Encounter (Signed)
Spoke with patient and informed him that he needs to come in for a hepatitis test.

## 2014-07-31 NOTE — Telephone Encounter (Signed)
Patient would like to know the results of his kidney test.

## 2014-08-06 ENCOUNTER — Telehealth: Payer: Self-pay

## 2014-08-06 NOTE — Telephone Encounter (Signed)
LVM for patient to update flu vaccine or come in the office to receive a flu shot by 08/22/2013

## 2014-08-07 ENCOUNTER — Telehealth: Payer: Self-pay | Admitting: Endocrinology

## 2014-08-07 NOTE — Telephone Encounter (Signed)
Justin Ayala from [facility] is checking on the status of patients Prior Auth. Phone # [phone]

## 2014-08-10 ENCOUNTER — Other Ambulatory Visit: Payer: Self-pay | Admitting: Family Medicine

## 2014-08-10 NOTE — Telephone Encounter (Signed)
Barnabas Lister calling again for status of the pt's auth for androgel

## 2014-08-20 ENCOUNTER — Encounter: Payer: Self-pay | Admitting: Endocrinology

## 2014-08-20 DIAGNOSIS — Z0279 Encounter for issue of other medical certificate: Secondary | ICD-10-CM

## 2014-08-23 ENCOUNTER — Other Ambulatory Visit: Payer: Self-pay | Admitting: *Deleted

## 2014-08-23 DIAGNOSIS — E291 Testicular hypofunction: Secondary | ICD-10-CM

## 2014-08-23 DIAGNOSIS — R5383 Other fatigue: Secondary | ICD-10-CM

## 2014-08-24 ENCOUNTER — Other Ambulatory Visit: Payer: BLUE CROSS/BLUE SHIELD

## 2014-08-24 DIAGNOSIS — E291 Testicular hypofunction: Secondary | ICD-10-CM

## 2014-08-26 LAB — TESTOSTERONE, FREE, TOTAL, SHBG
Sex Hormone Binding: 49.5 nmol/L (ref 19.3–76.4)
Testosterone, Free: 5.4 pg/mL — ABNORMAL LOW (ref 6.6–18.1)
Testosterone: 315 ng/dL — ABNORMAL LOW (ref 348–1197)

## 2014-08-28 ENCOUNTER — Telehealth: Payer: Self-pay | Admitting: Internal Medicine

## 2014-08-28 ENCOUNTER — Other Ambulatory Visit: Payer: Self-pay | Admitting: Geriatric Medicine

## 2014-08-28 MED ORDER — MELOXICAM 7.5 MG PO TABS: 7.5000 mg | ORAL_TABLET | Freq: Every day | ORAL | Status: DC

## 2014-08-28 MED ORDER — ASPIRIN EC 81 MG PO TBEC: 81.0000 mg | DELAYED_RELEASE_TABLET | Freq: Every day | ORAL | Status: DC

## 2014-08-28 MED ORDER — METOPROLOL TARTRATE 25 MG PO TABS: 12.5000 mg | ORAL_TABLET | Freq: Two times a day (BID) | ORAL | Status: DC

## 2014-08-28 NOTE — Telephone Encounter (Signed)
Spoke with patient. Dr. Doug Sou did not deny any refills. I sent the medications through again.

## 2014-08-28 NOTE — Telephone Encounter (Signed)
Patient has sent in prescription refills and has been denied by provider. meloxicam (MOBIC) 7.5 MG tablet [[id number], metoprolol tartrate (LOPRESSOR) 25 MG tablet [[id number]] and aspirin EC 81 MG tablet [[id number] with [facility]. Please advise patient

## 2014-08-31 ENCOUNTER — Other Ambulatory Visit: Payer: BLUE CROSS/BLUE SHIELD

## 2014-08-31 ENCOUNTER — Other Ambulatory Visit: Payer: Self-pay | Admitting: Family Medicine

## 2014-08-31 ENCOUNTER — Other Ambulatory Visit: Payer: Self-pay

## 2014-08-31 ENCOUNTER — Telehealth: Payer: Self-pay

## 2014-08-31 DIAGNOSIS — E291 Testicular hypofunction: Secondary | ICD-10-CM

## 2014-08-31 NOTE — Telephone Encounter (Signed)
Order for total and free testosterone placed per Dr. Ronnie Derby verbal orders.

## 2014-08-31 NOTE — Telephone Encounter (Signed)
If he has not started his testosterone gel then he does not need to come for labs

## 2014-08-31 NOTE — Telephone Encounter (Signed)
Pt is coming in for labs today at 830 am. Pt had a free testosterone done on 08/24/2014. Should anything be ordered for today's visit? Thanks!

## 2014-09-02 LAB — TESTOSTERONE,FREE AND TOTAL
Testosterone, Free: 3.8 pg/mL — ABNORMAL LOW (ref 6.6–18.1)
Testosterone: 231 ng/dL — ABNORMAL LOW (ref 348–1197)

## 2014-09-02 NOTE — Progress Notes (Signed)
Quick Note:  Please let patient know that the level is still low although slightly better. Lab results need to be added to his prior authorization paperwork since the insurance company wanted a second testosterone done  ______

## 2014-09-05 ENCOUNTER — Other Ambulatory Visit: Payer: Self-pay | Admitting: Gastroenterology

## 2014-09-05 ENCOUNTER — Ambulatory Visit: Payer: BLUE CROSS/BLUE SHIELD | Admitting: Endocrinology

## 2014-09-05 DIAGNOSIS — R945 Abnormal results of liver function studies: Principal | ICD-10-CM

## 2014-09-05 DIAGNOSIS — R7989 Other specified abnormal findings of blood chemistry: Secondary | ICD-10-CM

## 2014-09-05 NOTE — Telephone Encounter (Signed)
PA was denied.  Insurance needs signature on the release of medical records forms. This is in the accordion folder in the front.

## 2014-09-14 ENCOUNTER — Ambulatory Visit (HOSPITAL_COMMUNITY)
Admission: RE | Admit: 2014-09-14 | Discharge: 2014-09-14 | Disposition: A | Discharge status: Home / Self Care | Payer: BLUE CROSS/BLUE SHIELD | Source: Ambulatory Visit | Attending: Gastroenterology | Admitting: Gastroenterology

## 2014-09-14 ENCOUNTER — Other Ambulatory Visit: Payer: Self-pay | Admitting: Gastroenterology

## 2014-09-14 ENCOUNTER — Encounter (HOSPITAL_COMMUNITY): Payer: Self-pay

## 2014-09-14 DIAGNOSIS — R7989 Other specified abnormal findings of blood chemistry: Secondary | ICD-10-CM | POA: Diagnosis present

## 2014-09-14 DIAGNOSIS — Z8546 Personal history of malignant neoplasm of prostate: Secondary | ICD-10-CM | POA: Insufficient documentation

## 2014-09-14 DIAGNOSIS — R945 Abnormal results of liver function studies: Principal | ICD-10-CM

## 2014-09-14 MED ORDER — IOHEXOL 300 MG/ML  SOLN
100.0000 mL | Freq: Once | INTRAMUSCULAR | Status: AC | PRN
  Administered 2014-09-14: 100 mL via INTRAVENOUS

## 2014-09-14 NOTE — Progress Notes (Signed)
Patient ID: Justin Ayala, male   DOB: 1951/03/16, 64 y.o.   MRN: [id number]   Called to CT to evaluate contrast extravasation site.  Approximately 100 mL of Omni 300 contrast was extravasated into left antecubital region and upper arm during CT study.  Patient denies pain, numbness, weakness.  Now applying ice bag to upper arm.  Exam:  Firmness of upper arm in biceps region at site of extravasation.  No skin breakdown or discoloration.  No arm weakness or tenderness.  Plan:  Cancel CT today.  Patient given instructions for care of extravasation site and told to elevate arm and apply ice.  Reschedule CT of abd and pelvis with contrast due to extravasation.

## 2014-09-25 ENCOUNTER — Telehealth: Payer: Self-pay | Admitting: Internal Medicine

## 2014-09-25 ENCOUNTER — Other Ambulatory Visit: Payer: Self-pay | Admitting: Geriatric Medicine

## 2014-09-25 MED ORDER — EZETIMIBE 10 MG PO TABS: 10.0000 mg | ORAL_TABLET | Freq: Every day | ORAL | Status: DC

## 2014-09-25 MED ORDER — LISINOPRIL 10 MG PO TABS: 5.0000 mg | ORAL_TABLET | Freq: Every day | ORAL | Status: DC

## 2014-09-25 MED ORDER — PANTOPRAZOLE SODIUM 40 MG PO TBEC: 40.0000 mg | DELAYED_RELEASE_TABLET | Freq: Every day | ORAL | Status: DC

## 2014-09-25 NOTE — Telephone Encounter (Signed)
Pharmacy called and requesting authorization for refills on pantoprazole (PROTONIX) 40 MG tablet [[id number], lisinopril (PRINIVIL,ZESTRIL) 10 MG tablet [[id number], and ezetimibe (ZETIA) 10 MG tablet [[id number]. These prescriptions were requested about a month ago to the wrong provider and was rejected. Patient is out of medication. Please approve ASAP

## 2014-09-25 NOTE — Telephone Encounter (Signed)
Sent to [facility]

## 2014-09-25 NOTE — Telephone Encounter (Signed)
error 

## 2014-10-02 ENCOUNTER — Other Ambulatory Visit: Payer: Self-pay | Admitting: Family Medicine

## 2014-10-04 ENCOUNTER — Encounter: Payer: Self-pay | Admitting: Cardiovascular Disease

## 2014-10-10 ENCOUNTER — Telehealth: Payer: Self-pay | Admitting: Internal Medicine

## 2014-10-10 NOTE — Telephone Encounter (Signed)
Patient requesting a refill of hydrochlorothiazide (HYDRODIURIL) 25 MG tablet [[id number]] x90 days to [facility]

## 2014-10-10 NOTE — Telephone Encounter (Signed)
lem called from [facility] need an autherization for this med. Please call them back [phone].

## 2014-10-19 ENCOUNTER — Telehealth: Payer: Self-pay | Admitting: Endocrinology

## 2014-10-19 NOTE — Telephone Encounter (Signed)
Patient would like to discuss with Suanne Marker regarding his testosterone    Thank you

## 2014-10-19 NOTE — Telephone Encounter (Signed)
[facility] said that they still have not rec'd this med

## 2014-10-19 NOTE — Telephone Encounter (Signed)
Appeal information was faxed off on 09/04/14, I instructed the patient to call his insurance company to find out what's going on.  Advise patient to call back with information.

## 2014-10-23 ENCOUNTER — Other Ambulatory Visit: Payer: Self-pay | Admitting: Geriatric Medicine

## 2014-10-23 MED ORDER — HYDROCHLOROTHIAZIDE 25 MG PO TABS: 25.0000 mg | ORAL_TABLET | Freq: Every day | ORAL | Status: DC

## 2014-10-23 NOTE — Telephone Encounter (Signed)
Sent 90 day supply to [facility].

## 2014-12-24 ENCOUNTER — Telehealth: Payer: Self-pay | Admitting: Endocrinology

## 2014-12-24 NOTE — Telephone Encounter (Signed)
Patient insurance Company would not pay for Testosterone (ANDROGEL) 20.25 MG/1.25GM (1.62%) GEL is there another  Alternative, please advise

## 2014-12-24 NOTE — Telephone Encounter (Signed)
He needs to find out from his insurance company what they will cover.  Will need to also have him follow-up with testosterone level before his next visit

## 2014-12-24 NOTE — Telephone Encounter (Signed)
Please advise below.

## 2014-12-27 ENCOUNTER — Other Ambulatory Visit: Payer: Self-pay | Admitting: *Deleted

## 2014-12-27 ENCOUNTER — Telehealth: Payer: Self-pay

## 2014-12-27 MED ORDER — TESTOSTERONE 10 MG/ACT (2%) TD GEL: TRANSDERMAL | Status: DC

## 2014-12-27 NOTE — Telephone Encounter (Signed)
rx on your desk waiting for signature

## 2014-12-27 NOTE — Telephone Encounter (Signed)
Please send in Fortesta gel 2 pumps on each thigh daily

## 2014-12-27 NOTE — Telephone Encounter (Signed)
Please see below and advise.

## 2014-12-27 NOTE — Telephone Encounter (Signed)
Pt called. He wanted to let Dr. Dwyane Dee and Suanne Marker know he contacted his insurance company about his androgel and requested what alternatives would acceptable. Pt stated he was advise they could not give out that information. Pt was also advised different medications would have to be submitted and coverage would be determined that way.

## 2014-12-31 ENCOUNTER — Telehealth: Payer: Self-pay | Admitting: Endocrinology

## 2014-12-31 NOTE — Telephone Encounter (Signed)
All information has been faxed to his insurance company today.

## 2014-12-31 NOTE — Telephone Encounter (Signed)
[employer] note dated 12/28/14 at 6:09 PM [facility] calling about a prior authorization requests that they need further information, needs to have the patients labs faxed over Fax # [phone]

## 2015-01-09 ENCOUNTER — Ambulatory Visit: Payer: BLUE CROSS/BLUE SHIELD | Attending: Orthopaedic Surgery | Admitting: Physical Therapy

## 2015-01-09 DIAGNOSIS — M545 Low back pain, unspecified: Secondary | ICD-10-CM

## 2015-01-09 DIAGNOSIS — M62830 Muscle spasm of back: Secondary | ICD-10-CM | POA: Insufficient documentation

## 2015-01-09 DIAGNOSIS — R293 Abnormal posture: Secondary | ICD-10-CM

## 2015-01-09 NOTE — Patient Instructions (Addendum)

## 2015-01-09 NOTE — Therapy (Signed)
[place], [place], [address] Phone: [phone]   Fax:  [phone]  Physical Therapy Evaluation  Patient Details  Name: Justin Ayala MRN: [id number] Date of Birth: 27-Sep-1950 Referring Provider:  Olga Millers, MD  Encounter Date: 01/09/2015      PT End of Session - 01/09/15 0940    Visit Number 1   Number of Visits 1   Date for PT Re-Evaluation 01/10/15   PT Start Time 0850   PT Stop Time 0939   PT Time Calculation (min) 49 min   Activity Tolerance Patient tolerated treatment well   Behavior During Therapy [facility] for tasks assessed/performed      Past Medical History  Diagnosis Date  . HTN (hypertension)   . HLD (hyperlipidemia)   . GERD (gastroesophageal reflux disease)   . Overweight(278.02)   . Allergic rhinitis   . CAD (coronary artery disease)     60-70% prox 1st diagonal branch stenosis, 50% dominant circumflex stenosis in PDA, normal LV function (by 10/12/2008 cath)  . Prostate cancer   . Kidney stones   . Peptic ulcer   . Anemia 2010  . History of cardiovascular stress test 10/02/2008    exercise tolerance test - abnormal test - subsequent cath     Past Surgical History  Procedure Laterality Date  . Cardiac catheterization  2010    non-critical stenosis  . Appendectomy  12/2008    Dr. Clyda Greener  . Tonsillectomy    . Biopsy prostate  03/19/11    gleason 3+3=6, volume 34 cc, psa 01/06/11 5.00  . Radioactive seed implant  08/14/2011    Procedure: RADIOACTIVE SEED IMPLANT;  Surgeon: Claybon Jabs, MD;  Location: [facility];  Service: Urology;  Laterality: N/A;  23  SEEDS IMPLANTED IN PROSTATE  . Flexible sigmoidoscopy N/A 08/18/2013    Procedure: FLEXIBLE SIGMOIDOSCOPY;  Surgeon: Beryle Beams, MD;  Location: [facility];  Service: Endoscopy;  Laterality: N/A;  . Hot hemostasis N/A 08/18/2013    Procedure: HOT HEMOSTASIS (ARGON PLASMA COAGULATION/BICAP);  Surgeon:  Beryle Beams, MD;  Location: [facility];  Service: Endoscopy;  Laterality: N/A;  AVM - sig colon    There were no vitals filed for this visit.  Visit Diagnosis:  Left-sided low back pain without sciatica - Plan: PT plan of care cert/re-cert  Muscle spasm of back - Plan: PT plan of care cert/re-cert  Abnormal posture - Plan: PT plan of care cert/re-cert      Subjective Assessment - 01/09/15 0904    Subjective pt is a 64 y.o M with CC of low back pain that has only occurred once that happened while he was putting in a tile floor. He reports that it got better after he had finished doing the tiling. HE reports some tightness during golfing swing.  he reports the symtpoms are the same since onset.    How long can you sit comfortably? unlimted   How long can you stand comfortably? unlimited   How long can you walk comfortably? unlimted   Diagnostic tests X-ray at work per pt report no problems   Patient Stated Goals to be pain free    Currently in Pain? Yes   Pain Score 0-No pain  when it hurts 7-8/10   Pain Location Back   Pain Orientation Left   Pain Descriptors / Indicators Tightness;Aching   Pain Type Chronic pain   Pain Onset More than a month ago  Pain Frequency Intermittent   Aggravating Factors  bending,twisting   Pain Relieving Factors stop and rest            [facility] PT Assessment - 01/09/15 0909    Assessment   Medical Diagnosis low back pain   Onset Date/Surgical Date --  last couple of months   Hand Dominance Right   Next MD Visit make one as needed   Prior Therapy no   Precautions   Precautions None   Restrictions   Weight Bearing Restrictions No   Balance Screen   Has the patient fallen in the past 6 months No   Has the patient had a decrease in activity level because of a fear of falling?  No   Is the patient reluctant to leave their home because of a fear of falling?  No   Home Environment   Living Environment Private residence   Living  Arrangements Spouse/significant other   Available Help at Discharge Available PRN/intermittently;Available 24 hours/day   Type of Home House   Home Access Level entry   Home Layout Two level   Alternate Level Stairs-Number of Steps 13   Alternate Level Stairs-Rails Right   Prior Function   Level of Independence Independent;Independent with basic ADLs   Vocation Full time employment   Scientist, product/process development   Leisure golf, camping, bowling, basketball,    Cognition   Overall Cognitive Status Within Functional Limits for tasks assessed   Posture/Postural Control   Posture/Postural Control Postural limitations   Postural Limitations Rounded Shoulders;Forward head   ROM / Strength   AROM / PROM / Strength AROM;Strength   AROM   AROM Assessment Site Lumbar   Lumbar Flexion 80  tightness at end range   Lumbar Extension 25   Lumbar - Right Side Bend 25   Lumbar - Left Side Bend 25   Lumbar - Right Rotation 30   Lumbar - Left Rotation 50%   Palpation   Palpation comment mild tenderness located at L lumbar distal paraspinals   Special Tests    Special Tests Lumbar   Lumbar Tests Slump Test;Straight Leg Raise;Prone Knee Bend Test   Slump test   Findings Negative   Prone Knee Bend Test   Findings Negative   Straight Leg Raise   Findings Negative                   [facility] Adult PT Treatment/Exercise - 01/09/15 0909    Lumbar Exercises: Stretches   Passive Hamstring Stretch 1 rep;30 seconds   Single Knee to Chest Stretch 1 rep;30 seconds   Lower Trunk Rotation 3 reps;10 seconds   Hip Flexor Stretch 1 rep;30 seconds   Piriformis Stretch 1 rep;30 seconds   Lumbar Exercises: Standing   Other Standing Lumbar Exercises rhomboid stretch / paraspinal stretch 1 x 30 sec  with cross arm hold on door knob   Lumbar Exercises: Prone   Other Prone Lumbar Exercises childs pose stretch 1 x 30 sec                PT Education - 01/09/15 0939     Education provided Yes   Education Details evaluation findings, anatomical education, HEP   Person(s) Educated Patient   Methods Explanation   Comprehension Verbalized understanding                    Plan - 01/09/15 0941    Clinical Impression Statement [name] presents to OPPT with  CC of L sided low back pain that has started a couple months ago. He reprots the symptoms are intermittent and only occurs with bending and twisting to the L. He demonstrates trunk mobility WFL with tightness noted at end range of flexion and side beding/rotation to the L and R. All special testing was negative. Palpation revealsed significant tightness of the lumbar paraspinals and quadratus lomborum on the L. lumbar Intervertebral movement is normal without pain or tenderness.  He exhibits muscle restriction in the hamstrings and hip flexors  reporting he has always exhibited tightn musculature. Pt opted to only attend one visit to get exercises and education on proper posture and stretching/exercises to decrease pain and improve function.    Pt will benefit from skilled therapeutic intervention in order to improve on the following deficits Pain;Increased muscle spasms;Impaired flexibility;Improper body mechanics;Postural dysfunction   Rehab Potential Good   PT Frequency --  1 visit only   PT Next Visit Plan pt opted for 1 visit only   PT Home Exercise Plan see HEP handout   Consulted and Agree with Plan of Care Patient         Problem List Patient Active Problem List   Diagnosis Date Noted  . Fatigue 10/10/2013  . Erectile dysfunction 12/20/2012  . Prostate cancer 04/27/2011    Class: Stage 1  . CAD (coronary artery disease)   . Kidney stones   . Adrenal cyst 02/04/2011  . Iron deficiency anemia 03/18/2009  . Overweight 02/03/2008  . HYPERLIPIDEMIA 07/22/2006  . HYPERTENSION, BENIGN SYSTEMIC 07/22/2006   Starr [place] PT, DPT, LAT, ATC  01/09/2015  9:55 AM      [facility] [address] [place], [place], [address] Phone: [phone]   Fax:  [phone]

## 2015-01-22 ENCOUNTER — Other Ambulatory Visit: Payer: Self-pay | Admitting: Orthopaedic Surgery

## 2015-01-22 DIAGNOSIS — M25552 Pain in left hip: Secondary | ICD-10-CM

## 2015-02-06 ENCOUNTER — Encounter: Payer: Self-pay | Admitting: Cardiovascular Disease

## 2015-02-08 ENCOUNTER — Other Ambulatory Visit: Payer: BLUE CROSS/BLUE SHIELD

## 2015-02-12 ENCOUNTER — Ambulatory Visit: Payer: BLUE CROSS/BLUE SHIELD | Admitting: Cardiovascular Disease

## 2015-03-26 ENCOUNTER — Encounter: Payer: Self-pay | Admitting: Cardiovascular Disease

## 2015-03-26 ENCOUNTER — Ambulatory Visit (INDEPENDENT_AMBULATORY_CARE_PROVIDER_SITE_OTHER): Payer: BLUE CROSS/BLUE SHIELD | Admitting: Cardiovascular Disease

## 2015-03-26 VITALS — BP 130/82 | HR 67 | Ht 67.0 in | Wt 199.0 lb

## 2015-03-26 DIAGNOSIS — I2510 Atherosclerotic heart disease of native coronary artery without angina pectoris: Secondary | ICD-10-CM | POA: Diagnosis not present

## 2015-03-26 DIAGNOSIS — I10 Essential (primary) hypertension: Secondary | ICD-10-CM

## 2015-03-26 DIAGNOSIS — I2583 Coronary atherosclerosis due to lipid rich plaque: Secondary | ICD-10-CM

## 2015-03-26 LAB — LIPID PANEL
Cholesterol: 217 mg/dL — ABNORMAL HIGH (ref 125–200)
HDL: 42 mg/dL (ref >=40)
LDL CALC: 155 mg/dL — AB (ref <130)
Total CHOL/HDL Ratio: 5.2 Ratio — ABNORMAL HIGH (ref <=5.0)
Triglycerides: 100 mg/dL (ref <150)
VLDL: 20 mg/dL (ref <30)

## 2015-03-26 LAB — HEPATIC FUNCTION PANEL
ALT: 50 U/L — ABNORMAL HIGH (ref 9–46)
AST: 48 U/L — ABNORMAL HIGH (ref 10–35)
Albumin: 4.3 g/dL (ref 3.6–5.1)
Alkaline Phosphatase: 62 U/L (ref 40–115)
Bilirubin, Direct: 0.1 mg/dL (ref <=0.2)
Indirect Bilirubin: 0.3 mg/dL (ref 0.2–1.2)
Total Bilirubin: 0.4 mg/dL (ref 0.2–1.2)
Total Protein: 7.2 g/dL (ref 6.1–8.1)

## 2015-03-26 NOTE — Assessment & Plan Note (Signed)
History of hyperlipidemia no longer on statin drug because of statin intolerance. Continues to be on Zetia. Did have elevated LFTs back in March of this year. I'm going to recheck a lipid and liver profile

## 2015-03-26 NOTE — Assessment & Plan Note (Signed)
History of CAD status post cardiac catheterization which I performed 10/12/08 that showed noncritical CAD with normal LV function. He denies chest pain or shortness of breath.

## 2015-03-26 NOTE — Progress Notes (Signed)
03/26/2015 Justin Ayala   13-Feb-1951  [id number]  Primary Physician Hoyt Koch, MD Primary Cardiologist: Lorretta Harp MD [name]   HPI:  The patient is a 64 year old mildly overweight married African American male, father of 72, grandfather to 4 grandchildren, who I saw 02/06/14.Justin Ayala He has a history of moderate, but not critical, CAD by catheterization, which I performed Oct 12, 2008. He had a 60% to 70% proximal first diagonal branch stenosis. He had 50% distal dominant circumflex stenosis in the PDA with normal LV function. His other problems include hypertension and hyperlipidemia. He does not smoke. He has been exercising more recently. He has had a gastric polyp in the past, found in the setting of a GI workup for GI bleed. He was transfused at that time.his lipid profile followed by his primary care physician. He denies chest pain or shortness of breath. Since I saw him last he was complaining of lower extremity weakness which resolved after he stopped his Lipitor on his own. He did have elevated liver function tests as well followed by Dr. Benson Norway   Current Outpatient Prescriptions  Medication Sig Dispense Refill  . aspirin EC 81 MG tablet Take 1 tablet (81 mg total) by mouth daily. 90 tablet 3  . Cholecalciferol (VITAMIN D-3) 1000 UNITS CAPS Take by mouth. Take one daily    . ezetimibe (ZETIA) 10 MG tablet Take 1 tablet (10 mg total) by mouth daily. 90 tablet 3  . ferrous sulfate 325 (65 FE) MG tablet Take 1 tablet (325 mg total) by mouth daily. 90 tablet 3  . hydrochlorothiazide (HYDRODIURIL) 25 MG tablet Take 1 tablet (25 mg total) by mouth daily. 90 tablet 3  . [name] 100 MG CAPS Take 1 capsule (100 mg total) by mouth daily. 90 each 0  . lisinopril (PRINIVIL,ZESTRIL) 10 MG tablet Take 0.5 tablets (5 mg total) by mouth daily. 45 tablet 3  . meloxicam (MOBIC) 7.5 MG tablet Take 1 tablet (7.5 mg total) by mouth daily. 90 tablet 3  . metoprolol  tartrate (LOPRESSOR) 25 MG tablet Take 0.5 tablets (12.5 mg total) by mouth 2 (two) times daily. 180 tablet 3  . mometasone (NASONEX) 50 MCG/ACT nasal spray USE 2 SPRAYS NASALLY ONCE A DAY AS DIRECTED 17 g 0  . Omega 3 1000 MG CAPS Take by mouth. One daily    . pantoprazole (PROTONIX) 40 MG tablet Take 1 tablet (40 mg total) by mouth daily. 90 tablet 3  . tadalafil (CIALIS) 5 MG tablet Take 1 tablet (5 mg total) by mouth daily as needed for erectile dysfunction. 20 tablet 0  . tamsulosin (FLOMAX) 0.4 MG CAPS capsule TAKE 1 BY MOUTH TWICE DAILY 90 capsule 3  . Testosterone 10 MG/ACT (2%) GEL Apply two pumps on each thigh daily 60 g 3  . vitamin B-12 (CYANOCOBALAMIN) 1000 MCG tablet Take 1 tablet (1,000 mcg total) by mouth daily. 90 tablet 0   No current facility-administered medications for this visit.    Allergies  Allergen Reactions  . Ace Inhibitors Cough    Social History   Social History  . Marital Status: Married    Spouse Name: N/A  . Number of Children: 3  . Years of Education: BS    Occupational History  .     Social History Main Topics  . Smoking status: Never Smoker   . Smokeless tobacco: Not on file  . Alcohol Use: No  . Drug Use: No  . Sexual  Activity:    Partners: Female   Other Topics Concern  . Not on file   Social History Narrative   married x 30+years; 3 children, 4 grandsons    no smoking or ETOH;    Retired Merchant navy officer formerly at [employer];    likes to golf (golfs 3-4 times a week); no exercise and rides cart while golfing           Review of Systems: General: negative for chills, fever, night sweats or weight changes.  Cardiovascular: negative for chest pain, dyspnea on exertion, edema, orthopnea, palpitations, paroxysmal nocturnal dyspnea or shortness of breath Dermatological: negative for rash Respiratory: negative for cough or wheezing Urologic: negative for hematuria Abdominal: negative for nausea, vomiting, diarrhea, bright red  blood per rectum, melena, or hematemesis Neurologic: negative for visual changes, syncope, or dizziness All other systems reviewed and are otherwise negative except as noted above.    Blood pressure 130/82, pulse 67, height 5\' 7"  (1.702 m), weight 199 lb (90.266 kg).  General appearance: alert and no distress Neck: no adenopathy, no carotid bruit, no JVD, supple, symmetrical, trachea midline and thyroid not enlarged, symmetric, no tenderness/mass/nodules Lungs: clear to auscultation bilaterally Heart: regular rate and rhythm, S1, S2 normal, no murmur, click, rub or gallop Extremities: extremities normal, atraumatic, no cyanosis or edema  EKG normal sinus rhythm at 67 with reverse R-wave progression consistent with old anterior myocardial infarction. He had nonspecific ST and T-wave changes. I personally reviewed his EKG  ASSESSMENT AND PLAN:   HYPERTENSION, BENIGN SYSTEMIC History of hypertension with blood pressure is 130/82. He is on lisinopril, hydrochlorothiazide,  and metoprolol. Continue current meds at current dosing  HYPERLIPIDEMIA History of hyperlipidemia no longer on statin drug because of statin intolerance. Continues to be on Zetia. Did have elevated LFTs back in March of this year. I'm going to recheck a lipid and liver profile  CAD (coronary artery disease) History of CAD status post cardiac catheterization which I performed 10/12/08 that showed noncritical CAD with normal LV function. He denies chest pain or shortness of breath.      Lorretta Harp MD FACP,FACC,FAHA, [facility] 03/26/2015 12:06 PM

## 2015-03-26 NOTE — Assessment & Plan Note (Signed)
History of hypertension with blood pressure is 130/82. He is on lisinopril, hydrochlorothiazide,  and metoprolol. Continue current meds at current dosing

## 2015-03-26 NOTE — Patient Instructions (Signed)
Medication Instructions:  Your physician recommends that you continue on your current medications as directed. Please refer to the Current Medication list given to you today.   Labwork: Your physician recommends that you return for lab work in: FASTING (LIPID/LIVER) The lab can be found on the FIRST FLOOR of out building in [address]   Testing/Procedures: None  Follow-Up: Your physician wants you to follow-up in: [place]. You will receive a reminder letter in the mail two months in advance. If you don't receive a letter, please call our office to schedule the follow-up appointment.   Any Other Special Instructions Will Be Listed Below (If Applicable).     If you need a refill on your cardiac medications before your next appointment, please call your pharmacy.

## 2015-04-01 ENCOUNTER — Telehealth: Payer: Self-pay | Admitting: Pharmacist Clinician (PhC)/ Clinical Pharmacy Specialist

## 2015-04-02 ENCOUNTER — Telehealth: Payer: Self-pay | Admitting: Cardiovascular Disease

## 2015-04-02 NOTE — Telephone Encounter (Signed)
Pt called in wanting the results to his liver and cholesterol labs. Please f/u with him  Thanks

## 2015-04-02 NOTE — Telephone Encounter (Signed)
Per Dr. Gwenlyn Found, recommendation for PCSK9 - forwarded to [facility]

## 2015-04-03 NOTE — Telephone Encounter (Signed)
Pt scheduled to see me on 11-22.  You can give him results and just let him know we will go over in detail at his appointment.

## 2015-04-03 NOTE — Telephone Encounter (Signed)
Close encounter 

## 2015-04-05 NOTE — Telephone Encounter (Signed)
Pt wanted results of LFT's and to know why he was seeing someone on 11/22.  Explained his results and appointment with me.  He normally works 3rd shift, but will be on 1st shift for several weeks and cannot come in on 11.22.  Appointment cancelled and patient states he will call back once schedule re-arranged, to set new appt.

## 2015-04-16 ENCOUNTER — Ambulatory Visit: Payer: BLUE CROSS/BLUE SHIELD | Admitting: Pharmacist Clinician (PhC)/ Clinical Pharmacy Specialist

## 2015-04-22 ENCOUNTER — Other Ambulatory Visit: Payer: Self-pay | Admitting: Internal Medicine

## 2015-05-16 ENCOUNTER — Other Ambulatory Visit: Payer: Self-pay | Admitting: Internal Medicine

## 2015-07-22 ENCOUNTER — Other Ambulatory Visit: Payer: Self-pay | Admitting: Geriatric Medicine

## 2015-07-22 MED ORDER — MELOXICAM 7.5 MG PO TABS: 7.5000 mg | ORAL_TABLET | Freq: Every day | ORAL | Status: DC

## 2015-07-22 MED ORDER — EZETIMIBE 10 MG PO TABS: 10.0000 mg | ORAL_TABLET | Freq: Every day | ORAL | Status: DC

## 2015-07-22 MED ORDER — METOPROLOL TARTRATE 25 MG PO TABS: 12.5000 mg | ORAL_TABLET | Freq: Two times a day (BID) | ORAL | Status: DC

## 2015-07-22 MED ORDER — HYDROCHLOROTHIAZIDE 25 MG PO TABS: 25.0000 mg | ORAL_TABLET | Freq: Every day | ORAL | Status: DC

## 2015-07-22 MED ORDER — LISINOPRIL 10 MG PO TABS: 5.0000 mg | ORAL_TABLET | Freq: Every day | ORAL | Status: DC

## 2015-07-22 MED ORDER — TAMSULOSIN HCL 0.4 MG PO CAPS: ORAL_CAPSULE | ORAL | Status: DC

## 2015-07-22 MED ORDER — PANTOPRAZOLE SODIUM 40 MG PO TBEC: 40.0000 mg | DELAYED_RELEASE_TABLET | Freq: Every day | ORAL | Status: DC

## 2015-10-15 ENCOUNTER — Encounter: Payer: Self-pay | Admitting: Internal Medicine

## 2015-10-15 ENCOUNTER — Other Ambulatory Visit (INDEPENDENT_AMBULATORY_CARE_PROVIDER_SITE_OTHER): Payer: Managed Care, Other (non HMO)

## 2015-10-15 ENCOUNTER — Ambulatory Visit (INDEPENDENT_AMBULATORY_CARE_PROVIDER_SITE_OTHER): Payer: Managed Care, Other (non HMO) | Admitting: Internal Medicine

## 2015-10-15 VITALS — BP 136/82 | HR 84 | Temp 97.8°F | Resp 12 | Ht 67.0 in | Wt 203.0 lb

## 2015-10-15 DIAGNOSIS — E785 Hyperlipidemia, unspecified: Secondary | ICD-10-CM | POA: Diagnosis not present

## 2015-10-15 DIAGNOSIS — Z8546 Personal history of malignant neoplasm of prostate: Secondary | ICD-10-CM | POA: Diagnosis not present

## 2015-10-15 DIAGNOSIS — K4090 Unilateral inguinal hernia, without obstruction or gangrene, not specified as recurrent: Secondary | ICD-10-CM

## 2015-10-15 DIAGNOSIS — I10 Essential (primary) hypertension: Secondary | ICD-10-CM | POA: Diagnosis not present

## 2015-10-15 LAB — COMPREHENSIVE METABOLIC PANEL
ALT: 51 U/L (ref 0–53)
AST: 50 U/L — ABNORMAL HIGH (ref 0–37)
Albumin: 4.4 g/dL (ref 3.5–5.2)
Alkaline Phosphatase: 58 U/L (ref 39–117)
BUN: 16 mg/dL (ref 6–23)
CO2: 29 mEq/L (ref 19–32)
Calcium: 9.5 mg/dL (ref 8.4–10.5)
Chloride: 105 mEq/L (ref 96–112)
Creatinine, Ser: 1.26 mg/dL (ref 0.40–1.50)
GFR: 73.43 mL/min (ref >60.00)
Glucose, Bld: 113 mg/dL — ABNORMAL HIGH (ref 70–99)
Potassium: 3.4 mEq/L — ABNORMAL LOW (ref 3.5–5.1)
Sodium: 140 mEq/L (ref 135–145)
Total Bilirubin: 0.5 mg/dL (ref 0.2–1.2)
Total Protein: 7 g/dL (ref 6.0–8.3)

## 2015-10-15 LAB — CBC
HCT: 41.3 % (ref 39.0–52.0)
Hemoglobin: 13.7 g/dL (ref 13.0–17.0)
MCHC: 33.2 g/dL (ref 30.0–36.0)
MCV: 83.9 fl (ref 78.0–100.0)
Platelets: 253 10*3/uL (ref 150.0–400.0)
RBC: 4.93 Mil/uL (ref 4.22–5.81)
RDW: 14.3 % (ref 11.5–15.5)
WBC: 6.6 10*3/uL (ref 4.0–10.5)

## 2015-10-15 LAB — LIPID PANEL
Cholesterol: 160 mg/dL (ref 0–200)
HDL: 35 mg/dL — ABNORMAL LOW (ref >39.00)
LDL Cholesterol: 96 mg/dL (ref 0–99)
NonHDL: 124.65
Total CHOL/HDL Ratio: 5
Triglycerides: 145 mg/dL (ref 0.0–149.0)
VLDL: 29 mg/dL (ref 0.0–40.0)

## 2015-10-15 LAB — PSA: PSA: 0.78 ng/mL (ref 0.10–4.00)

## 2015-10-15 NOTE — Patient Instructions (Signed)
We will check the blood work today and call you back with the results.   We will get you in with a surgeon to fix the hernia.   Come back sometime later this year for the physical and call us sooner with any problems or questions.

## 2015-10-15 NOTE — Progress Notes (Signed)
   Subjective:    Patient ID: Justin Ayala, male    DOB: [birth year], 65 y.o.   MRN: [id number]  HPI The patient is a 65 YO man coming in for follow up of his cholesterol. Taking lipitor 4 days per week. Not having any recurrent muscle weakness. Previously had to stop due to elevated LFTs and muscle weakness.  Also new problem of groin hernia, has been going back to the gym lately and rarely it hurts him. 5/10 pain when it hurts. Never gets stuck out. Has had another in the past. Has not tried anything for it.   Review of Systems  Constitutional: Positive for activity change. Negative for fever, chills, appetite change and unexpected weight change.  Respiratory: Negative for cough, chest tightness, shortness of breath and wheezing.   Cardiovascular: Negative for chest pain, palpitations and leg swelling.  Gastrointestinal: Positive for abdominal pain. Negative for diarrhea, constipation, blood in stool and abdominal distention.       Hernia  Genitourinary:       ED  Musculoskeletal: Positive for arthralgias. Negative for myalgias, back pain and gait problem.  Skin: Negative.   Neurological: Negative for dizziness, syncope, weakness, light-headedness and headaches.  Psychiatric/Behavioral: Negative.       Objective:   Physical Exam  Constitutional: He is oriented to person, place, and time. He appears well-developed and well-nourished.  Overweight  HENT:  Head: Normocephalic and atraumatic.  Eyes: EOM are normal.  Neck: Normal range of motion.  Cardiovascular: Normal rate and regular rhythm.   Pulmonary/Chest: Effort normal and breath sounds normal. No respiratory distress. He has no wheezes. He has no rales.  Abdominal: Soft. He exhibits no distension. There is no tenderness. There is no rebound.  Musculoskeletal: He exhibits no edema.  Neurological: He is alert and oriented to person, place, and time. Coordination normal.  Skin: Skin is warm and dry.   Filed Vitals:   10/15/15  1459  BP: 136/82  Pulse: 84  Temp: 97.8 F (36.6 C)  TempSrc: Oral  Resp: 12  Height: 5\' 7"  (1.702 m)  Weight: 203 lb (92.08 kg)  SpO2: 96%      Assessment & Plan:

## 2015-10-15 NOTE — Assessment & Plan Note (Signed)
Checking PSA today, no new symptoms or pain in bones.

## 2015-10-15 NOTE — Progress Notes (Signed)
Pre visit review using our clinic review tool, if applicable. No additional management support is needed unless otherwise documented below in the visit note. 

## 2015-10-15 NOTE — Assessment & Plan Note (Signed)
Checking lipid panel and LFTs. Taking zetia and lipitor 4 days per week (started back without anyone's advice). If elevated LFTs needs to stop lipitor.

## 2015-10-15 NOTE — Assessment & Plan Note (Signed)
BP at goal on lisinopril and hctz and metoprolol. Checking CMP and adjust as needed.

## 2015-10-18 DIAGNOSIS — K4090 Unilateral inguinal hernia, without obstruction or gangrene, not specified as recurrent: Secondary | ICD-10-CM | POA: Insufficient documentation

## 2015-10-18 NOTE — Assessment & Plan Note (Signed)
Referral to general surgery. Not incarcerated.

## 2015-11-14 ENCOUNTER — Other Ambulatory Visit: Payer: Self-pay | Admitting: *Deleted

## 2015-11-14 MED ORDER — ATORVASTATIN CALCIUM 40 MG PO TABS: 40.0000 mg | ORAL_TABLET | Freq: Every day | ORAL | Status: DC

## 2015-11-14 MED ORDER — MELOXICAM 7.5 MG PO TABS: 7.5000 mg | ORAL_TABLET | Freq: Every day | ORAL | Status: DC

## 2015-11-14 MED ORDER — HYDROCHLOROTHIAZIDE 25 MG PO TABS: 25.0000 mg | ORAL_TABLET | Freq: Every day | ORAL | Status: DC

## 2016-01-22 ENCOUNTER — Other Ambulatory Visit: Payer: Self-pay | Admitting: Internal Medicine

## 2016-04-24 ENCOUNTER — Encounter: Payer: Self-pay | Admitting: Cardiovascular Disease

## 2016-04-24 ENCOUNTER — Ambulatory Visit (INDEPENDENT_AMBULATORY_CARE_PROVIDER_SITE_OTHER): Payer: Medicare Other | Admitting: Cardiovascular Disease

## 2016-04-24 DIAGNOSIS — I10 Essential (primary) hypertension: Secondary | ICD-10-CM

## 2016-04-24 DIAGNOSIS — E785 Hyperlipidemia, unspecified: Secondary | ICD-10-CM

## 2016-04-24 DIAGNOSIS — I2510 Atherosclerotic heart disease of native coronary artery without angina pectoris: Secondary | ICD-10-CM | POA: Diagnosis not present

## 2016-04-24 LAB — HEPATIC FUNCTION PANEL
ALBUMIN: 4.3 g/dL (ref 3.6–5.1)
ALT: 81 U/L — ABNORMAL HIGH (ref 9–46)
AST: 79 U/L — ABNORMAL HIGH (ref 10–35)
Alkaline Phosphatase: 58 U/L (ref 40–115)
Bilirubin, Direct: 0.1 mg/dL (ref <=0.2)
Indirect Bilirubin: 0.5 mg/dL (ref 0.2–1.2)
TOTAL PROTEIN: 7.1 g/dL (ref 6.1–8.1)
Total Bilirubin: 0.6 mg/dL (ref 0.2–1.2)

## 2016-04-24 LAB — LIPID PANEL
Cholesterol: 164 mg/dL (ref <200)
HDL: 43 mg/dL (ref >40)
LDL CALC: 99 mg/dL (ref <100)
TRIGLYCERIDES: 111 mg/dL (ref <150)
Total CHOL/HDL Ratio: 3.8 Ratio (ref <5.0)
VLDL: 22 mg/dL (ref <30)

## 2016-04-24 NOTE — Assessment & Plan Note (Signed)
History of hyperlipidemia on a reduced statin frequency because of lower extremity  myopathy and weakness as well as elevated liver function tests. We will recheck a lipid and liver profile

## 2016-04-24 NOTE — Patient Instructions (Signed)

## 2016-04-24 NOTE — Assessment & Plan Note (Signed)
History of noncritical CAD by cardiac catheterization which I performed 10/12/08. He had a 60-70% proximal first diagonal branch stenosis as well as 50% distal dominant circumflex stenosis in the PDA as well as normal LV function. He denies chest pain or shortness of breath.

## 2016-04-24 NOTE — Assessment & Plan Note (Signed)
History of hypertension with blood pressure measured at 140/72. He is on lisinopril, hydrochlorothiazide and metoprolol. Continue current meds at current dosing

## 2016-04-24 NOTE — Progress Notes (Signed)
04/24/2016 Justin Ayala   1950-10-18  [id number]  Primary Physician Hoyt Koch, MD Primary Cardiologist: Lorretta Harp MD Lupe Carney, [place]  HPI:  The patient is a 65 year old mildly overweight married African American male, father of 71, grandfather to 4 grandchildren, who I saw 03/26/15.[name] He has a history of moderate, but not critical, CAD by catheterization, which I performed Oct 12, 2008. He had a 60% to 70% proximal first diagonal branch stenosis. He had 50% distal dominant circumflex stenosis in the PDA with normal LV function. His other problems include hypertension and hyperlipidemia. He does not smoke. He has been exercising more recently. He has had a gastric polyp in the past, found in the setting of a GI workup for GI bleed. He was transfused at that time.his lipid profile followed by his primary care physician. He denies chest pain or shortness of breath. Since I saw him last he was complaining of lower extremity weakness which resolved after he stopped his Lipitor on his own. He did have elevated liver function tests as well followed by Dr. Benson Norway. He has been adjusting his Lipitor dose and frequency on his own.   Current Outpatient Prescriptions  Medication Sig Dispense Refill  . ASPIRIN ADULT LOW STRENGTH 81 MG EC tablet TAKE 1 BY MOUTH DAILY 90 tablet 0  . atorvastatin (LIPITOR) 40 MG tablet Take 1 tablet (40 mg total) by mouth daily at 6 PM. 90 tablet 3  . Cholecalciferol (VITAMIN D-3) 1000 UNITS CAPS Take by mouth. Take one daily    . ezetimibe (ZETIA) 10 MG tablet Take 1 tablet (10 mg total) by mouth daily. 90 tablet 3  . ferrous sulfate 325 (65 FE) MG tablet Take 1 tablet (325 mg total) by mouth daily. 90 tablet 3  . hydrochlorothiazide (HYDRODIURIL) 25 MG tablet Take 1 tablet (25 mg total) by mouth daily. 90 tablet 3  . [name] 100 MG CAPS Take 1 capsule (100 mg total) by mouth daily. 90 each 0  . lisinopril (PRINIVIL,ZESTRIL) 10 MG tablet Take  0.5 tablets (5 mg total) by mouth daily. 45 tablet 3  . meloxicam (MOBIC) 7.5 MG tablet Take 1 tablet (7.5 mg total) by mouth daily. 90 tablet 3  . metoprolol tartrate (LOPRESSOR) 25 MG tablet TAKE 1/2 BY MOUTH TWICE    DAILY                      (GENERIC FOR LOPRESSOR) 180 tablet 3  . pantoprazole (PROTONIX) 40 MG tablet TAKE 1 BY MOUTH DAILY 90 tablet 3  . tamsulosin (FLOMAX) 0.4 MG CAPS capsule TAKE 1 BY MOUTH TWICE DAILY 180 capsule 3   No current facility-administered medications for this visit.     Allergies  Allergen Reactions  . Ace Inhibitors Cough    Social History   Social History  . Marital status: Married    Spouse name: N/A  . Number of children: 3  . Years of education: BS    Occupational History  .  [employer]   Social History Main Topics  . Smoking status: Never Smoker  . Smokeless tobacco: Never Used  . Alcohol use No  . Drug use: No  . Sexual activity: Yes    Partners: Female   Other Topics Concern  . Not on file   Social History Narrative   married x 30+years; 3 children, 4 grandsons    no smoking or ETOH;    Retired Merchant navy officer formerly at  [employer];    likes to golf (golfs 3-4 times a week); no exercise and rides cart while golfing           Review of Systems: General: negative for chills, fever, night sweats or weight changes.  Cardiovascular: negative for chest pain, dyspnea on exertion, edema, orthopnea, palpitations, paroxysmal nocturnal dyspnea or shortness of breath Dermatological: negative for rash Respiratory: negative for cough or wheezing Urologic: negative for hematuria Abdominal: negative for nausea, vomiting, diarrhea, bright red blood per rectum, melena, or hematemesis Neurologic: negative for visual changes, syncope, or dizziness All other systems reviewed and are otherwise negative except as noted above.    Blood pressure 140/72, pulse (!) 59, height 5\' 7"  (1.702 m), weight 200 lb (90.7 kg).  General appearance:  alert and no distress Neck: no adenopathy, no carotid bruit, no JVD, supple, symmetrical, trachea midline and thyroid not enlarged, symmetric, no tenderness/mass/nodules Lungs: clear to auscultation bilaterally Heart: regular rate and rhythm, S1, S2 normal, no murmur, click, rub or gallop Extremities: extremities normal, atraumatic, no cyanosis or edema  EKG sinus bradycardia at 59 with reverse R-wave progression and nonspecific ST and T-wave changes along with inferior T-wave inversion. I personally reviewed this EKG  ASSESSMENT AND PLAN:   Hyperlipidemia History of hyperlipidemia on a reduced statin frequency because of lower extremity  myopathy and weakness as well as elevated liver function tests. We will recheck a lipid and liver profile  CAD (coronary artery disease) History of noncritical CAD by cardiac catheterization which I performed 10/12/08. He had a 60-70% proximal first diagonal branch stenosis as well as 50% distal dominant circumflex stenosis in the PDA as well as normal LV function. He denies chest pain or shortness of breath.  HYPERTENSION, BENIGN SYSTEMIC History of hypertension with blood pressure measured at 140/72. He is on lisinopril, hydrochlorothiazide and metoprolol. Continue current meds at current dosing      Lorretta Harp MD [facility], [facility] 04/24/2016 10:28 AM

## 2016-04-27 ENCOUNTER — Other Ambulatory Visit: Payer: Self-pay | Admitting: Cardiovascular Disease

## 2016-04-27 ENCOUNTER — Telehealth: Payer: Self-pay | Admitting: Cardiovascular Disease

## 2016-04-27 DIAGNOSIS — E663 Overweight: Secondary | ICD-10-CM

## 2016-04-27 DIAGNOSIS — E785 Hyperlipidemia, unspecified: Secondary | ICD-10-CM

## 2016-04-27 NOTE — Telephone Encounter (Signed)
Lipid panel  Order: [id number]  Status:  Final result Visible to patient:  No (Not Released) Dx:  Hyperlipidemia, unspecified hyperlipi...  Notes Recorded by Therisa Doyne on 04/27/2016 at 10:57 AM EST Results given to pt. Pt verbalized understanding. Pt stated he has already cut back on his atorvastatin, taking 2-3 tablets weekly. He agreed to stop for 3 months and recheck labs. Placed lab orders and will mail pt lab slips.  ------  Notes Recorded by Lorretta Harp, MD on 04/25/2016 at 1:41 AM EST LFTs are elevated. Statin holiday for 3 months then recheck Lipid and liver

## 2016-08-17 DIAGNOSIS — H35033 Hypertensive retinopathy, bilateral: Secondary | ICD-10-CM | POA: Diagnosis not present

## 2016-08-17 DIAGNOSIS — H25013 Cortical age-related cataract, bilateral: Secondary | ICD-10-CM | POA: Diagnosis not present

## 2016-08-17 DIAGNOSIS — H04123 Dry eye syndrome of bilateral lacrimal glands: Secondary | ICD-10-CM | POA: Diagnosis not present

## 2016-08-17 DIAGNOSIS — H2513 Age-related nuclear cataract, bilateral: Secondary | ICD-10-CM | POA: Diagnosis not present

## 2016-08-31 ENCOUNTER — Other Ambulatory Visit: Payer: Self-pay | Admitting: Internal Medicine

## 2016-09-28 ENCOUNTER — Telehealth: Payer: Self-pay | Admitting: Cardiovascular Disease

## 2016-09-28 DIAGNOSIS — E785 Hyperlipidemia, unspecified: Secondary | ICD-10-CM

## 2016-09-28 NOTE — Telephone Encounter (Signed)
New Message     Pt was told in Feb to stop medication for 3 months , is he suppose to restart it now

## 2016-09-28 NOTE — Telephone Encounter (Signed)
Returned call to patient - lady answered, patient unavailable. Asked to have him return call.   Lipid panel  Order: [id number]  Status:  Final result Visible to patient:  No (Not Released) Dx:  Hyperlipidemia, unspecified hyperlipi...  Notes Recorded by Therisa Doyne on 04/27/2016 at 10:57 AM EST Results given to pt. Pt verbalized understanding. Pt stated he has already cut back on his atorvastatin, taking 2-3 tablets weekly. He agreed to stop for 3 months and recheck labs. Placed lab orders and will mail pt lab slips.  ------  Notes Recorded by Lorretta Harp, MD on 04/25/2016 at 1:41 AM EST LFTs are elevated. Statin holiday for 3 months then recheck Lipid and liver

## 2016-09-28 NOTE — Telephone Encounter (Signed)
Returning your call from today. °

## 2016-09-28 NOTE — Telephone Encounter (Signed)
Returned call to patient. Advised that he should have fasting labs per blood work done in Deb 2017 when he was instructed to hold statin. He was upset that no one had called him to remind him of needing to get this blood work done. Apologized for this and explained that lab slips were mailed per documentation and instructions should have been given along with this. Advised patient he can use the [facility] lab in our building and will need to be fasting. He voiced understanding.

## 2016-09-29 ENCOUNTER — Other Ambulatory Visit: Payer: Self-pay | Admitting: Cardiovascular Disease

## 2016-09-29 DIAGNOSIS — E785 Hyperlipidemia, unspecified: Secondary | ICD-10-CM | POA: Diagnosis not present

## 2016-09-29 LAB — LIPID PANEL
CHOLESTEROL: 235 mg/dL — AB (ref <200)
HDL: 44 mg/dL (ref >40)
LDL Cholesterol: 166 mg/dL — ABNORMAL HIGH (ref <100)
Total CHOL/HDL Ratio: 5.3 Ratio — ABNORMAL HIGH (ref <5.0)
Triglycerides: 123 mg/dL (ref <150)
VLDL: 25 mg/dL (ref <30)

## 2016-09-29 LAB — HEPATIC FUNCTION PANEL
ALK PHOS: 62 U/L (ref 40–115)
ALT: 42 U/L (ref 9–46)
AST: 46 U/L — AB (ref 10–35)
Albumin: 4.2 g/dL (ref 3.6–5.1)
BILIRUBIN DIRECT: 0.1 mg/dL (ref <=0.2)
BILIRUBIN INDIRECT: 0.4 mg/dL (ref 0.2–1.2)
BILIRUBIN TOTAL: 0.5 mg/dL (ref 0.2–1.2)
Total Protein: 7 g/dL (ref 6.1–8.1)

## 2016-10-05 ENCOUNTER — Telehealth: Payer: Self-pay | Admitting: Cardiovascular Disease

## 2016-10-05 NOTE — Telephone Encounter (Signed)
Called patient and left a VM to call back to schedule his appointment with Erasmo Downer for PCSK9 therapy (Repatha eval).

## 2016-10-06 ENCOUNTER — Ambulatory Visit (INDEPENDENT_AMBULATORY_CARE_PROVIDER_SITE_OTHER): Payer: Medicare Other | Admitting: Pharmacist

## 2016-10-06 DIAGNOSIS — E785 Hyperlipidemia, unspecified: Secondary | ICD-10-CM | POA: Diagnosis not present

## 2016-10-06 MED ORDER — EVOLOCUMAB WITH INFUSOR 420 MG/3.5ML ~~LOC~~ SOCT: 420.0000 mg | SUBCUTANEOUS | 11 refills | Status: DC

## 2016-10-06 NOTE — Progress Notes (Signed)
Patient ID: Sumit Branham                 DOB: 1950/12/06                    MRN: [id number]      HPI: Skylen Spiering is a 66 y.o. male patient referred to lipid clinic by Dr Gwenlyn Found. PMH includes hypertension, CAD and hyperlipidemia. Catheterization performed May/2010 showed 60% to 70% proximal first diagonal branch stenosis and 50% distal dominant circumflex stenosis in the PDA with normal LV function. Patient developed low extremity weakness while taking atorvastatin as well as elevate liver function test.  Patient continues to take ezetimibe 10mg  daily    Current Medications:  Ezetimibe 10mg  daily  Intolerances:  Pravastatin 80mg   - not appropriate clinical response Atorvastatin 40mg  daily - elevated LFT (~300) with lower extremity pain and weakness Atorvastatin 40mg  3x/week - elevated liver enzymes  LDL goal: <70 mg/dL  Diet: try to limit "bad food" intake but not avoiding anything in particular  Exercise: gym every day  Family History: no cardiac family istory  Social History: tobacco use, denies alcohol use  Labs: CHO 235; TG 123; HDL 44; LDL 166 (09/29/2016) on Zetia 10mg   Past Medical History:  Diagnosis Date  . Allergic rhinitis   . Anemia 2010  . CAD (coronary artery disease)    60-70% prox 1st diagonal branch stenosis, 50% dominant circumflex stenosis in PDA, normal LV function (by 10/12/2008 cath)  . GERD (gastroesophageal reflux disease)   . History of cardiovascular stress test 10/02/2008   exercise tolerance test - abnormal test - subsequent cath   . HLD (hyperlipidemia)   . HTN (hypertension)   . Kidney stones   . Overweight(278.02)   . Peptic ulcer   . Prostate cancer [place] [facility])     Current Outpatient Prescriptions on File Prior to Visit  Medication Sig Dispense Refill  . ASPIRIN ADULT LOW STRENGTH 81 MG EC tablet TAKE 1 BY MOUTH DAILY 90 tablet 0  . atorvastatin (LIPITOR) 40 MG tablet Take 1 tablet (40 mg total) by mouth daily at 6 PM. 90 tablet 3  .  Cholecalciferol (VITAMIN D-3) 1000 UNITS CAPS Take by mouth. Take one daily    . ezetimibe (ZETIA) 10 MG tablet TAKE ONE TABLET BY MOUTH ONCE DAILY 90 tablet 0  . ferrous sulfate 325 (65 FE) MG tablet Take 1 tablet (325 mg total) by mouth daily. 90 tablet 3  . hydrochlorothiazide (HYDRODIURIL) 25 MG tablet Take 1 tablet (25 mg total) by mouth daily. 90 tablet 3  . [name] 100 MG CAPS Take 1 capsule (100 mg total) by mouth daily. 90 each 0  . lisinopril (PRINIVIL,ZESTRIL) 10 MG tablet TAKE ONE-HALF TABLET BY MOUTH ONCE DAILY 45 tablet 0  . meloxicam (MOBIC) 7.5 MG tablet Take 1 tablet (7.5 mg total) by mouth daily. 90 tablet 3  . metoprolol tartrate (LOPRESSOR) 25 MG tablet TAKE 1/2 BY MOUTH TWICE    DAILY                      (GENERIC FOR LOPRESSOR) 180 tablet 3  . pantoprazole (PROTONIX) 40 MG tablet TAKE 1 BY MOUTH DAILY 90 tablet 3  . tamsulosin (FLOMAX) 0.4 MG CAPS capsule TAKE ONE CAPSULE BY MOUTH TWICE DAILY 180 capsule 0   No current facility-administered medications on file prior to visit.     Allergies  Allergen Reactions  . Ace Inhibitors Cough  Assessment/Plan: LDL well above recommended goal for patient with ASCVD.  Patient cholesterol was better controlled  with combination atorvastatin 40mg  daily plus ezetimibe 10mg  daily but his liver enzymes went up to the 300s and patient was experiencing pain/weakness on lower extremities.  Trial with atorvastatin 40mg  3 times weekly also caused elevated LFT and LDL-c not at goal.    PCSK9 inhibitors are appropriate at this time. Options for Repatha 420mg  every month and Repatha 140mg  SureClick presented to patient. His preference is to use Repatha 420mg  monthly infusion.  Indication, storage, administration, monitoring and common side effects discussed during appointment.  Paperwork requirements and [employer] program also discussed with patient.  Will initiate paperwork for Repatha pre-approval with his insurance and keep  patient updated by phone.   Jefrey Raburn Rodriguez-Guzman PharmD, [place] [id number] 10/06/2016 7:26 AM

## 2016-10-06 NOTE — Patient Instructions (Signed)
Lipid Clinic Janijah Symons/Kristin Pharmacist [phone]   Cholesterol Cholesterol is a fat. Your body needs a small amount of cholesterol. Cholesterol (plaque) may build up in your blood vessels (arteries). That makes you more likely to have a heart attack or stroke. You cannot feel your cholesterol level. Having a blood test is the only way to find out if your level is high. Keep your test results. Work with your doctor to keep your cholesterol at a good level. What do the results mean?  Total cholesterol is how much cholesterol is in your blood.  LDL is bad cholesterol. This is the type that can build up. Try to have low LDL.  HDL is good cholesterol. It cleans your blood vessels and carries LDL away. Try to have high HDL.  Triglycerides are fat that the body can store or burn for energy. What are good levels of cholesterol?  Total cholesterol below 200.  LDL below 100 is good for people who have health risks. LDL below 70 is good for people who have very high risks.  HDL above 40 is good. It is best to have HDL of 60 or higher.  Triglycerides below 150. How can I lower my cholesterol? Diet  Follow your diet program as told by your doctor.  Choose fish, white meat chicken, or [place] that is roasted or baked. Try not to eat red meat, fried foods, sausage, or lunch meats.  Eat lots of fresh fruits and vegetables.  Choose whole grains, beans, pasta, potatoes, and cereals.  Choose olive oil, corn oil, or canola oil. Only use small amounts.  Try not to eat butter, mayonnaise, shortening, or palm kernel oils.  Try not to eat foods with trans fats.  Choose low-fat or nonfat dairy foods.  Drink skim or nonfat milk.  Eat low-fat or nonfat yogurt and cheeses.  Try not to drink whole milk or cream.  Try not to eat ice cream, egg yolks, or full-fat cheeses.  Healthy desserts include angel food cake, ginger snaps, animal crackers, hard candy, popsicles, and low-fat or nonfat  frozen yogurt. Try not to eat pastries, cakes, pies, and cookies. Exercise  Follow your exercise program as told by your doctor.  Be more active. Try gardening, walking, and taking the stairs.  Ask your doctor about ways that you can be more active. Medicine  Take over-the-counter and prescription medicines only as told by your doctor. This information is not intended to replace advice given to you by your health care provider. Make sure you discuss any questions you have with your health care provider. Document Released: 08/07/2008 Document Revised: 12/11/2015 Document Reviewed: 11/21/2015 Elsevier Interactive Patient Education  2017 [employer].

## 2016-11-17 DIAGNOSIS — M25561 Pain in right knee: Secondary | ICD-10-CM | POA: Diagnosis not present

## 2016-11-17 DIAGNOSIS — R262 Difficulty in walking, not elsewhere classified: Secondary | ICD-10-CM | POA: Diagnosis not present

## 2016-11-17 DIAGNOSIS — M1712 Unilateral primary osteoarthritis, left knee: Secondary | ICD-10-CM | POA: Diagnosis not present

## 2016-11-17 DIAGNOSIS — M25562 Pain in left knee: Secondary | ICD-10-CM | POA: Diagnosis not present

## 2016-11-17 DIAGNOSIS — M170 Bilateral primary osteoarthritis of knee: Secondary | ICD-10-CM | POA: Diagnosis not present

## 2016-11-23 DIAGNOSIS — M1712 Unilateral primary osteoarthritis, left knee: Secondary | ICD-10-CM | POA: Diagnosis not present

## 2016-11-23 DIAGNOSIS — M25562 Pain in left knee: Secondary | ICD-10-CM | POA: Diagnosis not present

## 2016-11-26 DIAGNOSIS — Z8546 Personal history of malignant neoplasm of prostate: Secondary | ICD-10-CM | POA: Diagnosis not present

## 2016-11-30 DIAGNOSIS — M1712 Unilateral primary osteoarthritis, left knee: Secondary | ICD-10-CM | POA: Diagnosis not present

## 2016-11-30 DIAGNOSIS — M25562 Pain in left knee: Secondary | ICD-10-CM | POA: Diagnosis not present

## 2016-12-01 DIAGNOSIS — Z8546 Personal history of malignant neoplasm of prostate: Secondary | ICD-10-CM | POA: Diagnosis not present

## 2016-12-02 ENCOUNTER — Other Ambulatory Visit: Payer: Self-pay | Admitting: Internal Medicine

## 2016-12-07 NOTE — Telephone Encounter (Signed)
Patient has scheduled one year med follow up with Marya Amsler on Thursday.  Patient is currently out of medication.  Is requesting med to be filled until appt.  Patient phone number is [phone] or [phone].

## 2016-12-07 NOTE — Telephone Encounter (Signed)
LVM asking pt to call back to verify pharmacy.

## 2016-12-08 MED ORDER — HYDROCHLOROTHIAZIDE 25 MG PO TABS: 25.0000 mg | ORAL_TABLET | Freq: Every day | ORAL | 0 refills | Status: DC

## 2016-12-08 NOTE — Telephone Encounter (Signed)
Sent week supply until appt...Justin Ayala

## 2016-12-08 NOTE — Telephone Encounter (Signed)
Pt is using Paediatric nurse on [employer].

## 2016-12-10 ENCOUNTER — Encounter: Payer: Self-pay | Admitting: Family

## 2016-12-10 ENCOUNTER — Ambulatory Visit (INDEPENDENT_AMBULATORY_CARE_PROVIDER_SITE_OTHER): Payer: Medicare Other | Admitting: Family

## 2016-12-10 VITALS — BP 122/80 | HR 64 | Temp 97.9°F | Resp 16 | Ht 67.0 in | Wt 203.0 lb

## 2016-12-10 DIAGNOSIS — I10 Essential (primary) hypertension: Secondary | ICD-10-CM

## 2016-12-10 DIAGNOSIS — E785 Hyperlipidemia, unspecified: Secondary | ICD-10-CM

## 2016-12-10 DIAGNOSIS — M25562 Pain in left knee: Secondary | ICD-10-CM | POA: Diagnosis not present

## 2016-12-10 DIAGNOSIS — Z8546 Personal history of malignant neoplasm of prostate: Secondary | ICD-10-CM | POA: Diagnosis not present

## 2016-12-10 MED ORDER — METOPROLOL TARTRATE 25 MG PO TABS: ORAL_TABLET | ORAL | 0 refills | Status: DC

## 2016-12-10 MED ORDER — MELOXICAM 7.5 MG PO TABS: 7.5000 mg | ORAL_TABLET | Freq: Every day | ORAL | 0 refills | Status: DC

## 2016-12-10 MED ORDER — TAMSULOSIN HCL 0.4 MG PO CAPS: 0.4000 mg | ORAL_CAPSULE | Freq: Two times a day (BID) | ORAL | 0 refills | Status: DC

## 2016-12-10 MED ORDER — EZETIMIBE 10 MG PO TABS: 10.0000 mg | ORAL_TABLET | Freq: Every day | ORAL | 0 refills | Status: DC

## 2016-12-10 MED ORDER — LISINOPRIL 10 MG PO TABS: 5.0000 mg | ORAL_TABLET | Freq: Every day | ORAL | 0 refills | Status: DC

## 2016-12-10 MED ORDER — HYDROCHLOROTHIAZIDE 25 MG PO TABS: 25.0000 mg | ORAL_TABLET | Freq: Every day | ORAL | 0 refills | Status: DC

## 2016-12-10 NOTE — Patient Instructions (Addendum)
Thank you for choosing [facility].  SUMMARY AND INSTRUCTIONS:  Please continue to take your medication as prescribed.  Monitor your blood pressure at home and follow a low sodium diet.  Medication:  Your prescription(s) have been submitted to your pharmacy or been printed and provided for you. Please take as directed and contact our office if you believe you are having problem(s) with the medication(s) or have any questions.  Follow up:  If your symptoms worsen or fail to improve, please contact our office for further instruction, or in case of emergency go directly to the emergency room at the closest medical facility.     Knee Exercises Ask your health care provider which exercises are safe for you. Do exercises exactly as told by your health care provider and adjust them as directed. It is normal to feel mild stretching, pulling, tightness, or discomfort as you do these exercises, but you should stop right away if you feel sudden pain or your pain gets worse.Do not begin these exercises until told by your health care provider. STRETCHING AND RANGE OF MOTION EXERCISES These exercises warm up your muscles and joints and improve the movement and flexibility of your knee. These exercises also help to relieve pain, numbness, and tingling. Exercise A: Knee Extension, Prone 1. Lie on your abdomen on a bed. 2. Place your left / right knee just beyond the edge of the surface so your knee is not on the bed. You can put a towel under your left / right thigh just above your knee for comfort. 3. Relax your leg muscles and allow gravity to straighten your knee. You should feel a stretch behind your left / right knee. 4. Hold this position for __________ seconds. 5. Scoot up so your knee is supported between repetitions. Repeat __________ times. Complete this stretch __________ times a day. Exercise B: Knee Flexion, Active  1. Lie on your back with both knees straight. If this causes back  discomfort, bend your left / right knee so your foot is flat on the floor. 2. Slowly slide your left / right heel back toward your buttocks until you feel a gentle stretch in the front of your knee or thigh. 3. Hold this position for __________ seconds. 4. Slowly slide your left / right heel back to the starting position. Repeat __________ times. Complete this exercise __________ times a day. Exercise C: Quadriceps, Prone  1. Lie on your abdomen on a firm surface, such as a bed or padded floor. 2. Bend your left / right knee and hold your ankle. If you cannot reach your ankle or pant leg, loop a belt around your foot and grab the belt instead. 3. Gently pull your heel toward your buttocks. Your knee should not slide out to the side. You should feel a stretch in the front of your thigh and knee. 4. Hold this position for __________ seconds. Repeat __________ times. Complete this stretch __________ times a day. Exercise D: Hamstring, Supine 1. Lie on your back. 2. Loop a belt or towel over the ball of your left / right foot. The ball of your foot is on the walking surface, right under your toes. 3. Straighten your left / right knee and slowly pull on the belt to raise your leg until you feel a gentle stretch behind your knee. ? Do not let your left / right knee bend while you do this. ? Keep your other leg flat on the floor. 4. Hold this position for __________  seconds. Repeat __________ times. Complete this stretch __________ times a day. STRENGTHENING EXERCISES These exercises build strength and endurance in your knee. Endurance is the ability to use your muscles for a long time, even after they get tired. Exercise E: Quadriceps, Isometric  1. Lie on your back with your left / right leg extended and your other knee bent. Put a rolled towel or small pillow under your knee if told by your health care provider. 2. Slowly tense the muscles in the front of your left / right thigh. You should see  your kneecap slide up toward your hip or see increased dimpling just above the knee. This motion will push the back of the knee toward the floor. 3. For __________ seconds, keep the muscle as tight as you can without increasing your pain. 4. Relax the muscles slowly and completely. Repeat __________ times. Complete this exercise __________ times a day. Exercise F: Straight Leg Raises - Quadriceps 1. Lie on your back with your left / right leg extended and your other knee bent. 2. Tense the muscles in the front of your left / right thigh. You should see your kneecap slide up or see increased dimpling just above the knee. Your thigh may even shake a bit. 3. Keep these muscles tight as you raise your leg 4-6 inches (10-15 cm) off the floor. Do not let your knee bend. 4. Hold this position for __________ seconds. 5. Keep these muscles tense as you lower your leg. 6. Relax your muscles slowly and completely after each repetition. Repeat __________ times. Complete this exercise __________ times a day. Exercise G: Hamstring, Isometric 1. Lie on your back on a firm surface. 2. Bend your left / right knee approximately __________ degrees. 3. Dig your left / right heel into the surface as if you are trying to pull it toward your buttocks. Tighten the muscles in the back of your thighs to dig as hard as you can without increasing any pain. 4. Hold this position for __________ seconds. 5. Release the tension gradually and allow your muscles to relax completely for __________ seconds after each repetition. Repeat __________ times. Complete this exercise __________ times a day. Exercise H: Hamstring Curls  If told by your health care provider, do this exercise while wearing ankle weights. Begin with __________ weights. Then increase the weight by 1 lb (0.5 kg) increments. Do not wear ankle weights that are more than __________. 1. Lie on your abdomen with your legs straight. 2. Bend your left / right knee  as far as you can without feeling pain. Keep your hips flat against the floor. 3. Hold this position for __________ seconds. 4. Slowly lower your leg to the starting position.  Repeat __________ times. Complete this exercise __________ times a day. Exercise I: Squats (Quadriceps) 1. Stand in front of a table, with your feet and knees pointing straight ahead. You may rest your hands on the table for balance but not for support. 2. Slowly bend your knees and lower your hips like you are going to sit in a chair. ? Keep your weight over your heels, not over your toes. ? Keep your lower legs upright so they are parallel with the table legs. ? Do not let your hips go lower than your knees. ? Do not bend lower than told by your health care provider. ? If your knee pain increases, do not bend as low. 3. Hold the squat position for __________ seconds. 4. Slowly push with your legs  to return to standing. Do not use your hands to pull yourself to standing. Repeat __________ times. Complete this exercise __________ times a day. Exercise J: Wall Slides (Quadriceps)  1. Lean your back against a smooth wall or door while you walk your feet out 18-24 inches (46-61 cm) from it. 2. Place your feet hip-width apart. 3. Slowly slide down the wall or door until your knees bend __________ degrees. Keep your knees over your heels, not over your toes. Keep your knees in line with your hips. 4. Hold for __________ seconds. Repeat __________ times. Complete this exercise __________ times a day. Exercise K: Straight Leg Raises - Hip Abductors 1. Lie on your side with your left / right leg in the top position. Lie so your head, shoulder, knee, and hip line up. You may bend your bottom knee to help you keep your balance. 2. Roll your hips slightly forward so your hips are stacked directly over each other and your left / right knee is facing forward. 3. Leading with your heel, lift your top leg 4-6 inches (10-15 cm). You  should feel the muscles in your outer hip lifting. ? Do not let your foot drift forward. ? Do not let your knee roll toward the ceiling. 4. Hold this position for __________ seconds. 5. Slowly return your leg to the starting position. 6. Let your muscles relax completely after each repetition. Repeat __________ times. Complete this exercise __________ times a day. Exercise L: Straight Leg Raises - Hip Extensors 1. Lie on your abdomen on a firm surface. You can put a pillow under your hips if that is more comfortable. 2. Tense the muscles in your buttocks and lift your left / right leg about 4-6 inches (10-15 cm). Keep your knee straight as you lift your leg. 3. Hold this position for __________ seconds. 4. Slowly lower your leg to the starting position. 5. Let your leg relax completely after each repetition. Repeat __________ times. Complete this exercise __________ times a day. This information is not intended to replace advice given to you by your health care provider. Make sure you discuss any questions you have with your health care provider. Document Released: 03/25/2005 Document Revised: 02/03/2016 Document Reviewed: 03/17/2015 Elsevier Interactive Patient Education  2018 [employer].

## 2016-12-10 NOTE — Progress Notes (Signed)
Subjective:    Patient ID: Justin Ayala, male    DOB: [birth year], 66 y.o.   MRN: [id number]  Chief Complaint  Patient presents with  . Medication Refill    meds needing refills are pended    HPI:  Justin Ayala is a 66 y.o. male who  has a past medical history of Allergic rhinitis; Anemia (2010); CAD (coronary artery disease); GERD (gastroesophageal reflux disease); History of cardiovascular stress test (10/02/2008); HLD (hyperlipidemia); HTN (hypertension); Kidney stones; Overweight(278.02); Peptic ulcer; and Prostate cancer ([place]). and presents today for an office visit.   1.) Hypertension - Currently maintained on metoprolol, lisinopril, hydrochlorothiazide.  Reports taking the medications as prescribed and denies adverse side effects or hypotensive readings. Not check blood pressure at home. Denies changes in vision, worst headache of life or new symptoms of end organ damage. Working on following a low sodium diet.   BP Readings from Last 3 Encounters:  12/10/16 122/80  04/24/16 140/72  10/15/15 136/82   2.) Hyperlipidemia - Currently maintained on Zetia. Reports taking the medication as prescribed and denies adverse side effects. No current chest pain or shortness of breath.   Lab Results  Component Value Date   CHOL 235 (H) 09/29/2016   HDL 44 09/29/2016   LDLCALC 166 (H) 09/29/2016   LDLDIRECT 84.3 05/30/2014   TRIG 123 09/29/2016   CHOLHDL 5.3 (H) 09/29/2016    3.) Knee pain - This is a new problem. Associated symptom of pain located in his left knee that has been going on for a couple of months and described as soreness at times. Modifying factors include injections received from [facility] which he indicates has not helped very much. Denies any trauma or injury. Severity does effect his ability to play golf. Endorses that his left leg appears smaller than the right.    Allergies  Allergen Reactions  . Ace Inhibitors Cough      Outpatient Medications Prior to  Visit  Medication Sig Dispense Refill  . ASPIRIN ADULT LOW STRENGTH 81 MG EC tablet TAKE 1 BY MOUTH DAILY 90 tablet 0  . Cholecalciferol (VITAMIN D-3) 1000 UNITS CAPS Take by mouth. Take one daily    . Evolocumab with Infusor ([place]) 420 MG/3.5ML SOCT Inject 420 mg into the skin every 30 (thirty) days. 1 Cartridge 11  . ferrous sulfate 325 (65 FE) MG tablet Take 1 tablet (325 mg total) by mouth daily. 90 tablet 3  . [name] 100 MG CAPS Take 1 capsule (100 mg total) by mouth daily. 90 each 0  . pantoprazole (PROTONIX) 40 MG tablet TAKE 1 BY MOUTH DAILY 90 tablet 3  . ezetimibe (ZETIA) 10 MG tablet TAKE ONE TABLET BY MOUTH ONCE DAILY 90 tablet 0  . hydrochlorothiazide (HYDRODIURIL) 25 MG tablet Take 1 tablet (25 mg total) by mouth daily. Must keep annual appt for future refills 7 tablet 0  . lisinopril (PRINIVIL,ZESTRIL) 10 MG tablet TAKE ONE-HALF TABLET BY MOUTH ONCE DAILY 45 tablet 0  . meloxicam (MOBIC) 7.5 MG tablet Take 1 tablet (7.5 mg total) by mouth daily. 90 tablet 3  . metoprolol tartrate (LOPRESSOR) 25 MG tablet TAKE 1/2 BY MOUTH TWICE    DAILY                      (GENERIC FOR LOPRESSOR) 180 tablet 3  . tamsulosin (FLOMAX) 0.4 MG CAPS capsule TAKE ONE CAPSULE BY MOUTH TWICE DAILY 180 capsule 0   No facility-administered medications  prior to visit.       Past Surgical History:  Procedure Laterality Date  . APPENDECTOMY  12/2008   Dr. Clyda Greener  . BIOPSY PROSTATE  03/19/11   gleason 3+3=6, volume 34 cc, psa 01/06/11 5.00  . CARDIAC CATHETERIZATION  2010   non-critical stenosis  . FLEXIBLE SIGMOIDOSCOPY N/A 08/18/2013   Procedure: FLEXIBLE SIGMOIDOSCOPY;  Surgeon: Beryle Beams, MD;  Location: [facility];  Service: Endoscopy;  Laterality: N/A;  . HOT HEMOSTASIS N/A 08/18/2013   Procedure: HOT HEMOSTASIS (ARGON PLASMA COAGULATION/BICAP);  Surgeon: Beryle Beams, MD;  Location: [facility];  Service: Endoscopy;  Laterality: N/A;  AVM - sig colon  .  RADIOACTIVE SEED IMPLANT  08/14/2011   Procedure: RADIOACTIVE SEED IMPLANT;  Surgeon: Claybon Jabs, MD;  Location: [facility];  Service: Urology;  Laterality: N/A;  30  SEEDS IMPLANTED IN PROSTATE  . TONSILLECTOMY        Past Medical History:  Diagnosis Date  . Allergic rhinitis   . Anemia 2010  . CAD (coronary artery disease)    60-70% prox 1st diagonal branch stenosis, 50% dominant circumflex stenosis in PDA, normal LV function (by 10/12/2008 cath)  . GERD (gastroesophageal reflux disease)   . History of cardiovascular stress test 10/02/2008   exercise tolerance test - abnormal test - subsequent cath   . HLD (hyperlipidemia)   . HTN (hypertension)   . Kidney stones   . Overweight(278.02)   . Peptic ulcer   . Prostate cancer ([place])       Review of Systems  Constitutional: Negative for chills and fever.  Eyes:       Negative for changes in vision  Respiratory: Negative for cough, chest tightness and wheezing.   Cardiovascular: Negative for chest pain, palpitations and leg swelling.  Neurological: Negative for dizziness, weakness and light-headedness.      Objective:    BP 122/80 (BP Location: Left Arm, Patient Position: Sitting, Cuff Size: Large)   Pulse 64   Temp 97.9 F (36.6 C) (Oral)   Resp 16   Ht 5\' 7"  (1.702 m)   Wt 203 lb (92.1 kg)   SpO2 98%   BMI 31.79 kg/m  Nursing note and vital signs reviewed.  Physical Exam  Constitutional: He is oriented to person, place, and time. He appears well-developed and well-nourished. No distress.  Cardiovascular: Normal rate, regular rhythm, normal heart sounds and intact distal pulses.   Pulmonary/Chest: Effort normal and breath sounds normal.  Musculoskeletal:  Left knee - no obvious edema or discoloration with mild atrophy of the left quadricep and VMO noted compared to the contralateral side. No tenderness able to be elicited upon palpation. Range of motion within normal limits. Strength is normal.  Distal pulses and sensation are intact and appropriate. Anterior drawer appears with more laxity compared to the contralateral side. Other ligamentous testing is negative. Meniscal testing is negative.  Neurological: He is alert and oriented to person, place, and time.  Skin: Skin is warm and dry.  Psychiatric: He has a normal mood and affect. His behavior is normal. Judgment and thought content normal.       Assessment & Plan:   Problem List Items Addressed This Visit      Cardiovascular and Mediastinum   HYPERTENSION, BENIGN SYSTEMIC    Hypertension appears adequately controlled and below goal 140/90 with current medication regimen and no adverse side effects. Continue current dosage of hydrochlorothiazide, metoprolol, and lisinopril. Encouraged to monitor blood pressure, follow  sodium diet.      Relevant Medications   hydrochlorothiazide (HYDRODIURIL) 25 MG tablet   lisinopril (PRINIVIL,ZESTRIL) 10 MG tablet   metoprolol tartrate (LOPRESSOR) 25 MG tablet   ezetimibe (ZETIA) 10 MG tablet     Other   Hyperlipidemia - Primary    Hyperlipidemia appears poorly controlled secondary to statin intolerance. Continue to work with cardiology and lipid clinic. Continue current dosage of Zetia pending cardiology follow-up.      Relevant Medications   hydrochlorothiazide (HYDRODIURIL) 25 MG tablet   lisinopril (PRINIVIL,ZESTRIL) 10 MG tablet   metoprolol tartrate (LOPRESSOR) 25 MG tablet   ezetimibe (ZETIA) 10 MG tablet   H/O prostate cancer   Acute pain of left knee    New-onset left knee pain with concern for muscle atrophy and increased laxity noted on anterior drawer. Treat conservatively with ice, home exercise therapy, and Duexis as needed. Patient declines physical therapy at this time. Follow-up in 3 weeks or sooner if symptoms worsen or do not improve with consideration for physical therapy and possible referral to orthopedics.          I have changed Justin Ayala  hydrochlorothiazide, lisinopril, metoprolol tartrate, tamsulosin, and ezetimibe. I am also having him maintain his ferrous sulfate, [name], Vitamin D-3, ASPIRIN ADULT LOW STRENGTH, pantoprazole, Evolocumab with Infusor, and meloxicam.   Meds ordered this encounter  Medications  . hydrochlorothiazide (HYDRODIURIL) 25 MG tablet    Sig: Take 1 tablet (25 mg total) by mouth daily.    Dispense:  90 tablet    Refill:  0  . lisinopril (PRINIVIL,ZESTRIL) 10 MG tablet    Sig: Take 0.5 tablets (5 mg total) by mouth daily.    Dispense:  45 tablet    Refill:  0  . meloxicam (MOBIC) 7.5 MG tablet    Sig: Take 1 tablet (7.5 mg total) by mouth daily.    Dispense:  90 tablet    Refill:  0  . metoprolol tartrate (LOPRESSOR) 25 MG tablet    Sig: TAKE 1/2 BY MOUTH TWICE DAILY  (GENERIC FOR LOPRESSOR)    Dispense:  90 tablet    Refill:  0  . tamsulosin (FLOMAX) 0.4 MG CAPS capsule    Sig: Take 1 capsule (0.4 mg total) by mouth 2 (two) times daily.    Dispense:  180 capsule    Refill:  0  . ezetimibe (ZETIA) 10 MG tablet    Sig: Take 1 tablet (10 mg total) by mouth daily.    Dispense:  90 tablet    Refill:  0     Follow-up: Return in about 3 weeks (around 12/31/2016), or if symptoms worsen or fail to improve.  Mauricio Po, FNP

## 2016-12-10 NOTE — Assessment & Plan Note (Signed)
New-onset left knee pain with concern for muscle atrophy and increased laxity noted on anterior drawer. Treat conservatively with ice, home exercise therapy, and Duexis as needed. Patient declines physical therapy at this time. Follow-up in 3 weeks or sooner if symptoms worsen or do not improve with consideration for physical therapy and possible referral to orthopedics.

## 2016-12-10 NOTE — Assessment & Plan Note (Signed)
Hypertension appears adequately controlled and below goal 140/90 with current medication regimen and no adverse side effects. Continue current dosage of hydrochlorothiazide, metoprolol, and lisinopril. Encouraged to monitor blood pressure, follow sodium diet.

## 2016-12-10 NOTE — Assessment & Plan Note (Signed)
Hyperlipidemia appears poorly controlled secondary to statin intolerance. Continue to work with cardiology and lipid clinic. Continue current dosage of Zetia pending cardiology follow-up.

## 2016-12-23 ENCOUNTER — Telehealth: Payer: Self-pay | Admitting: Pharmacist

## 2016-12-23 NOTE — Telephone Encounter (Signed)
LMOM; repatha was approved by insurance and Patient Assistance paperwork completed.   Need to know if patient is taking medication and date of 1st injection.

## 2016-12-24 NOTE — Telephone Encounter (Signed)
Left second message on phone today. Trying to follow up on initiation of repatha infusion.  Patient to call back to update clinic on day of 1st infusion or to discuss any problems with acquisition or administration.

## 2017-01-04 ENCOUNTER — Telehealth: Payer: Self-pay | Admitting: Internal Medicine

## 2017-01-04 NOTE — Telephone Encounter (Signed)
Pt called in and needs refill on lisinopril (PRINIVIL,ZESTRIL) 10 MG tablet [[id number]]  [facility] on file

## 2017-01-22 ENCOUNTER — Telehealth: Payer: Self-pay | Admitting: Pharmacist

## 2017-01-22 NOTE — Telephone Encounter (Signed)
Information confirmed with [employer]. Patient approved since June/03/2017. Two unsuccesful attempts made to contact patient and arrange delivery.   Patient need to call [phone] (option 2) to arrange delivery of Repatha 420mg  [facility]

## 2017-01-22 NOTE — Telephone Encounter (Signed)
Patient still waiting for Reptha delivery. Patient was informed (fpor the 2nd time) he has to call [employer] safety [employer] to arrange delivery.   I will call [employer] today to verify still accurate and no additional documentation is needed.

## 2017-01-28 DIAGNOSIS — N5235 Erectile dysfunction following radiation therapy: Secondary | ICD-10-CM | POA: Diagnosis not present

## 2017-03-19 ENCOUNTER — Other Ambulatory Visit: Payer: Self-pay

## 2017-03-19 MED ORDER — HYDROCHLOROTHIAZIDE 25 MG PO TABS: 1.0000 mg | ORAL_TABLET | Freq: Every day | ORAL | 1 refills | Status: DC

## 2017-03-23 ENCOUNTER — Other Ambulatory Visit: Payer: Self-pay

## 2017-03-23 ENCOUNTER — Telehealth: Payer: Self-pay | Admitting: Internal Medicine

## 2017-03-23 MED ORDER — HYDROCHLOROTHIAZIDE 25 MG PO TABS: 25.0000 mg | ORAL_TABLET | Freq: Every day | ORAL | 1 refills | Status: DC

## 2017-03-23 NOTE — Telephone Encounter (Signed)
Re sent correctly to pharmacy 1 pill daily with 90 day supply with 1 refill

## 2017-03-23 NOTE — Telephone Encounter (Signed)
Pt called stating that he picked up his hydrochlorothiazide 25 MG prescription from the pharmacy but it was sent incorrectly. He said that it was sent with the directions to take 0.5 tablet once a day but he usually takes 1 tablet a day. He also said that they only dispensed a quantity of #15. He would like this corrected and sent as a 90 day supply.

## 2017-03-30 ENCOUNTER — Other Ambulatory Visit: Payer: Self-pay

## 2017-03-30 MED ORDER — LISINOPRIL 10 MG PO TABS
5.0000 mg | ORAL_TABLET | Freq: Every day | ORAL | 0 refills | Status: DC
Start: 2017-03-30 — End: 2017-06-25

## 2017-03-30 MED ORDER — EZETIMIBE 10 MG PO TABS: 10.0000 mg | ORAL_TABLET | Freq: Every day | ORAL | 0 refills | Status: DC

## 2017-03-30 MED ORDER — MELOXICAM 7.5 MG PO TABS: 7.5000 mg | ORAL_TABLET | Freq: Every day | ORAL | 0 refills | Status: DC

## 2017-04-19 ENCOUNTER — Telehealth: Payer: Self-pay | Admitting: Pharmacist

## 2017-04-19 DIAGNOSIS — E785 Hyperlipidemia, unspecified: Secondary | ICD-10-CM

## 2017-04-19 NOTE — Telephone Encounter (Signed)
Need renewal for Repatha safety Net. Has follow up with Dr Gwenlyn Found Dec/08/2016 - repeat blood work and complete paperwork for patient assistance.

## 2017-04-27 ENCOUNTER — Ambulatory Visit (INDEPENDENT_AMBULATORY_CARE_PROVIDER_SITE_OTHER): Payer: Medicare Other | Admitting: Cardiovascular Disease

## 2017-04-27 ENCOUNTER — Encounter: Payer: Self-pay | Admitting: Cardiovascular Disease

## 2017-04-27 VITALS — BP 126/78 | HR 65 | Ht 67.0 in | Wt 205.0 lb

## 2017-04-27 DIAGNOSIS — E7800 Pure hypercholesterolemia, unspecified: Secondary | ICD-10-CM | POA: Diagnosis not present

## 2017-04-27 DIAGNOSIS — I10 Essential (primary) hypertension: Secondary | ICD-10-CM | POA: Diagnosis not present

## 2017-04-27 DIAGNOSIS — I2510 Atherosclerotic heart disease of native coronary artery without angina pectoris: Secondary | ICD-10-CM | POA: Diagnosis not present

## 2017-04-27 DIAGNOSIS — E785 Hyperlipidemia, unspecified: Secondary | ICD-10-CM | POA: Diagnosis not present

## 2017-04-27 LAB — LIPID PANEL
CHOL/HDL RATIO: 2.3 ratio (ref 0.0–5.0)
Cholesterol, Total: 112 mg/dL (ref 100–199)
HDL: 48 mg/dL (ref >39)
LDL Calculated: 39 mg/dL (ref 0–99)
Triglycerides: 124 mg/dL (ref 0–149)
VLDL CHOLESTEROL CAL: 25 mg/dL (ref 5–40)

## 2017-04-27 LAB — HEPATIC FUNCTION PANEL
ALBUMIN: 4.6 g/dL (ref 3.6–4.8)
ALK PHOS: 92 IU/L (ref 39–117)
ALT: 34 IU/L (ref 0–44)
AST: 36 IU/L (ref 0–40)
Bilirubin Total: 0.4 mg/dL (ref 0.0–1.2)
Bilirubin, Direct: 0.15 mg/dL (ref 0.00–0.40)
TOTAL PROTEIN: 7.3 g/dL (ref 6.0–8.5)

## 2017-04-27 NOTE — Assessment & Plan Note (Signed)
History of essential hypertension blood pressure measured at 126/78. He is on hydrochlorothiazide, metoprolol and lisinopril. Continue current meds at current dosing.

## 2017-04-27 NOTE — Assessment & Plan Note (Signed)
History of hyperlipidemia intolerant to statin therapy with recent lipid profile performed 09/29/16 revealing LDL 166. He has been on Repatha for the  last 3 months. I will recheck a lipid and liver profile.

## 2017-04-27 NOTE — Assessment & Plan Note (Signed)
History of CAD status post cardiac catheterization by myself 10/12/08 revealed a 67% proximal first diagonal branch stenosis, and a 50% distal dominant circumflex stenosis in the PDA with normal LV function. He denies chest pain.

## 2017-04-27 NOTE — Progress Notes (Signed)
04/27/2017 Justin Ayala   [birth year]  [id number]  Primary Physician Hoyt Koch, MD Primary Cardiologist: Lorretta Harp MD Garret Reddish, [place], [place]  HPI:  Justin Ayala is a 66 y.o.  mildly overweight married African American male, father of 84, grandfather to 4 grandchildren, who I saw 03/26/15.[name] He has a history of moderate, but not critical, CAD by catheterization, which I performed Oct 12, 2008. He had a 60% to 70% proximal first diagonal branch stenosis. He had 50% distal dominant circumflex stenosis in the PDA with normal LV function. His other problems include hypertension and hyperlipidemia. He does not smoke. He has been exercising more recently. He has had a gastric polyp in the past, found in the setting of a GI workup for GI bleed. He was transfused at that time.his lipid profile followed by his primary care physician. He denies chest pain or shortness of breath. Since I saw him last he was complaining of lower extremity weakness which resolved after he stopped his Lipitor on his own. He did have elevated liver function tests as well followed by Dr. Benson Norway.  because of his statin intolerance and his elevated LDL of 166 measured on 09/29/16 he was begun on Repatha approximately 3 months ago which he is tolerating well. Otherwise, he denies chest pain or shortness of breath.    Current Meds  Medication Sig  . ASPIRIN ADULT LOW STRENGTH 81 MG EC tablet TAKE 1 BY MOUTH DAILY  . Cholecalciferol (VITAMIN D-3) 1000 UNITS CAPS Take by mouth. Take one daily  . Evolocumab with Infusor ([place]) 420 MG/3.5ML SOCT Inject 420 mg into the skin every 30 (thirty) days.  [name] ezetimibe (ZETIA) 10 MG tablet Take 1 tablet (10 mg total) daily by mouth.  . ferrous sulfate 325 (65 FE) MG tablet Take 1 tablet (325 mg total) by mouth daily.  . hydrochlorothiazide (HYDRODIURIL) 25 MG tablet Take 1 tablet (25 mg total) by mouth daily.  Coralyn Pear Ginseng 100 MG CAPS Take 1 capsule (100  mg total) by mouth daily.  [name] lisinopril (PRINIVIL,ZESTRIL) 10 MG tablet Take 0.5 tablets (5 mg total) daily by mouth.  . meloxicam (MOBIC) 7.5 MG tablet Take 1 tablet (7.5 mg total) daily by mouth.  . metoprolol tartrate (LOPRESSOR) 25 MG tablet TAKE 1/2 BY MOUTH TWICE DAILY  (GENERIC FOR LOPRESSOR)  . pantoprazole (PROTONIX) 40 MG tablet TAKE 1 BY MOUTH DAILY  . tamsulosin (FLOMAX) 0.4 MG CAPS capsule Take 1 capsule (0.4 mg total) by mouth 2 (two) times daily.     Allergies  Allergen Reactions  . Ace Inhibitors Cough    Social History   Socioeconomic History  . Marital status: Married    Spouse name: Not on file  . Number of children: 3  . Years of education: BS   . Highest education level: Not on file  Social Needs  . Financial resource strain: Not on file  . Food insecurity - worry: Not on file  . Food insecurity - inability: Not on file  . Transportation needs - medical: Not on file  . Transportation needs - non-medical: Not on file  Occupational History    Employer: [employer]  Tobacco Use  . Smoking status: Never Smoker  . Smokeless tobacco: Never Used  Substance and Sexual Activity  . Alcohol use: No  . Drug use: No  . Sexual activity: Yes    Partners: Female  Other Topics Concern  . Not on file  Social History  Narrative   married x 30+years; 3 children, 4 grandsons    no smoking or ETOH;    Retired Merchant navy officer formerly at [employer];    likes to golf (golfs 3-4 times a week); no exercise and rides cart while golfing           Review of Systems: General: negative for chills, fever, night sweats or weight changes.  Cardiovascular: negative for chest pain, dyspnea on exertion, edema, orthopnea, palpitations, paroxysmal nocturnal dyspnea or shortness of breath Dermatological: negative for rash Respiratory: negative for cough or wheezing Urologic: negative for hematuria Abdominal: negative for nausea, vomiting, diarrhea, bright red blood per rectum,  melena, or hematemesis Neurologic: negative for visual changes, syncope, or dizziness All other systems reviewed and are otherwise negative except as noted above.    Blood pressure 126/78, pulse 65, height 5\' 7"  (1.702 m), weight 205 lb (93 kg).  General appearance: alert and no distress Neck: no adenopathy, no carotid bruit, no JVD, supple, symmetrical, trachea midline and thyroid not enlarged, symmetric, no tenderness/mass/nodules Lungs: clear to auscultation bilaterally Heart: regular rate and rhythm, S1, S2 normal, no murmur, click, rub or gallop Extremities: extremities normal, atraumatic, no cyanosis or edema Pulses: 2+ and symmetric Skin: Skin color, texture, turgor normal. No rashes or lesions Neurologic: Alert and oriented X 3, normal strength and tone. Normal symmetric reflexes. Normal coordination and gait  EKG sinus rhythm at 65 with nonspecific ST and T-wave changes. I personally reviewed this EKG.  ASSESSMENT AND PLAN:   Hyperlipidemia History of hyperlipidemia intolerant to statin therapy with recent lipid profile performed 09/29/16 revealing LDL 166. He has been on Repatha for the  last 3 months. I will recheck a lipid and liver profile.  HYPERTENSION, BENIGN SYSTEMIC History of essential hypertension blood pressure measured at 126/78. He is on hydrochlorothiazide, metoprolol and lisinopril. Continue current meds at current dosing.  CAD (coronary artery disease) History of CAD status post cardiac catheterization by myself 10/12/08 revealed a 67% proximal first diagonal branch stenosis, and a 50% distal dominant circumflex stenosis in the PDA with normal LV function. He denies chest pain.      Lorretta Harp MD FACP,FACC,FAHA, [facility] 04/27/2017 9:49 AM

## 2017-04-27 NOTE — Patient Instructions (Signed)
Medication Instructions: Your physician recommends that you continue on your current medications as directed. Please refer to the Current Medication list given to you today.  Labwork: Your physician recommends that you return for a FASTING lipid profile and hepatic function panel today.   Follow-Up: Your physician wants you to follow-up in: 1 year with Dr. Berry. You will receive a reminder letter in the mail two months in advance. If you don't receive a letter, please call our office to schedule the follow-up appointment.  If you need a refill on your cardiac medications before your next appointment, please call your pharmacy.  

## 2017-04-28 ENCOUNTER — Encounter: Payer: Self-pay | Admitting: Cardiovascular Disease

## 2017-04-29 ENCOUNTER — Telehealth: Payer: Self-pay | Admitting: Pharmacist

## 2017-04-29 NOTE — Telephone Encounter (Signed)
LDL down from 166 to 39.  Patient tolerating Repatha 420mg  ; paperwork for insurance renewal sent today

## 2017-05-07 ENCOUNTER — Other Ambulatory Visit: Payer: Self-pay | Admitting: Internal Medicine

## 2017-06-07 DIAGNOSIS — M9902 Segmental and somatic dysfunction of thoracic region: Secondary | ICD-10-CM | POA: Diagnosis not present

## 2017-06-07 DIAGNOSIS — M9900 Segmental and somatic dysfunction of head region: Secondary | ICD-10-CM | POA: Diagnosis not present

## 2017-06-07 DIAGNOSIS — M9901 Segmental and somatic dysfunction of cervical region: Secondary | ICD-10-CM | POA: Diagnosis not present

## 2017-06-07 DIAGNOSIS — R51 Headache: Secondary | ICD-10-CM | POA: Diagnosis not present

## 2017-06-07 DIAGNOSIS — C61 Malignant neoplasm of prostate: Secondary | ICD-10-CM | POA: Diagnosis not present

## 2017-06-09 DIAGNOSIS — M9900 Segmental and somatic dysfunction of head region: Secondary | ICD-10-CM | POA: Diagnosis not present

## 2017-06-09 DIAGNOSIS — R51 Headache: Secondary | ICD-10-CM | POA: Diagnosis not present

## 2017-06-09 DIAGNOSIS — M9902 Segmental and somatic dysfunction of thoracic region: Secondary | ICD-10-CM | POA: Diagnosis not present

## 2017-06-09 DIAGNOSIS — M9901 Segmental and somatic dysfunction of cervical region: Secondary | ICD-10-CM | POA: Diagnosis not present

## 2017-06-10 DIAGNOSIS — M9902 Segmental and somatic dysfunction of thoracic region: Secondary | ICD-10-CM | POA: Diagnosis not present

## 2017-06-10 DIAGNOSIS — M9900 Segmental and somatic dysfunction of head region: Secondary | ICD-10-CM | POA: Diagnosis not present

## 2017-06-10 DIAGNOSIS — R51 Headache: Secondary | ICD-10-CM | POA: Diagnosis not present

## 2017-06-10 DIAGNOSIS — M720 Palmar fascial fibromatosis [Dupuytren]: Secondary | ICD-10-CM | POA: Insufficient documentation

## 2017-06-10 DIAGNOSIS — M9901 Segmental and somatic dysfunction of cervical region: Secondary | ICD-10-CM | POA: Diagnosis not present

## 2017-06-14 DIAGNOSIS — R51 Headache: Secondary | ICD-10-CM | POA: Diagnosis not present

## 2017-06-14 DIAGNOSIS — M9902 Segmental and somatic dysfunction of thoracic region: Secondary | ICD-10-CM | POA: Diagnosis not present

## 2017-06-14 DIAGNOSIS — M9901 Segmental and somatic dysfunction of cervical region: Secondary | ICD-10-CM | POA: Diagnosis not present

## 2017-06-14 DIAGNOSIS — M9900 Segmental and somatic dysfunction of head region: Secondary | ICD-10-CM | POA: Diagnosis not present

## 2017-06-15 DIAGNOSIS — R51 Headache: Secondary | ICD-10-CM | POA: Diagnosis not present

## 2017-06-15 DIAGNOSIS — M9902 Segmental and somatic dysfunction of thoracic region: Secondary | ICD-10-CM | POA: Diagnosis not present

## 2017-06-15 DIAGNOSIS — M9900 Segmental and somatic dysfunction of head region: Secondary | ICD-10-CM | POA: Diagnosis not present

## 2017-06-15 DIAGNOSIS — M9901 Segmental and somatic dysfunction of cervical region: Secondary | ICD-10-CM | POA: Diagnosis not present

## 2017-06-17 DIAGNOSIS — M9900 Segmental and somatic dysfunction of head region: Secondary | ICD-10-CM | POA: Diagnosis not present

## 2017-06-17 DIAGNOSIS — R51 Headache: Secondary | ICD-10-CM | POA: Diagnosis not present

## 2017-06-17 DIAGNOSIS — M9901 Segmental and somatic dysfunction of cervical region: Secondary | ICD-10-CM | POA: Diagnosis not present

## 2017-06-17 DIAGNOSIS — M9902 Segmental and somatic dysfunction of thoracic region: Secondary | ICD-10-CM | POA: Diagnosis not present

## 2017-06-23 DIAGNOSIS — M9902 Segmental and somatic dysfunction of thoracic region: Secondary | ICD-10-CM | POA: Diagnosis not present

## 2017-06-23 DIAGNOSIS — R51 Headache: Secondary | ICD-10-CM | POA: Diagnosis not present

## 2017-06-23 DIAGNOSIS — M9901 Segmental and somatic dysfunction of cervical region: Secondary | ICD-10-CM | POA: Diagnosis not present

## 2017-06-23 DIAGNOSIS — M9900 Segmental and somatic dysfunction of head region: Secondary | ICD-10-CM | POA: Diagnosis not present

## 2017-06-24 DIAGNOSIS — M9902 Segmental and somatic dysfunction of thoracic region: Secondary | ICD-10-CM | POA: Diagnosis not present

## 2017-06-24 DIAGNOSIS — R51 Headache: Secondary | ICD-10-CM | POA: Diagnosis not present

## 2017-06-24 DIAGNOSIS — M9900 Segmental and somatic dysfunction of head region: Secondary | ICD-10-CM | POA: Diagnosis not present

## 2017-06-24 DIAGNOSIS — M9901 Segmental and somatic dysfunction of cervical region: Secondary | ICD-10-CM | POA: Diagnosis not present

## 2017-06-25 ENCOUNTER — Other Ambulatory Visit: Payer: Self-pay | Admitting: Internal Medicine

## 2017-06-28 DIAGNOSIS — M9902 Segmental and somatic dysfunction of thoracic region: Secondary | ICD-10-CM | POA: Diagnosis not present

## 2017-06-28 DIAGNOSIS — M9901 Segmental and somatic dysfunction of cervical region: Secondary | ICD-10-CM | POA: Diagnosis not present

## 2017-06-28 DIAGNOSIS — R51 Headache: Secondary | ICD-10-CM | POA: Diagnosis not present

## 2017-06-28 DIAGNOSIS — M9900 Segmental and somatic dysfunction of head region: Secondary | ICD-10-CM | POA: Diagnosis not present

## 2017-06-30 DIAGNOSIS — M9900 Segmental and somatic dysfunction of head region: Secondary | ICD-10-CM | POA: Diagnosis not present

## 2017-06-30 DIAGNOSIS — R51 Headache: Secondary | ICD-10-CM | POA: Diagnosis not present

## 2017-06-30 DIAGNOSIS — M9901 Segmental and somatic dysfunction of cervical region: Secondary | ICD-10-CM | POA: Diagnosis not present

## 2017-06-30 DIAGNOSIS — M9902 Segmental and somatic dysfunction of thoracic region: Secondary | ICD-10-CM | POA: Diagnosis not present

## 2017-07-12 ENCOUNTER — Other Ambulatory Visit: Payer: Self-pay | Admitting: Internal Medicine

## 2017-08-06 ENCOUNTER — Other Ambulatory Visit: Payer: Self-pay | Admitting: Internal Medicine

## 2017-08-24 DIAGNOSIS — H2513 Age-related nuclear cataract, bilateral: Secondary | ICD-10-CM | POA: Diagnosis not present

## 2017-08-24 DIAGNOSIS — H3509 Other intraretinal microvascular abnormalities: Secondary | ICD-10-CM | POA: Diagnosis not present

## 2017-08-24 DIAGNOSIS — H35033 Hypertensive retinopathy, bilateral: Secondary | ICD-10-CM | POA: Diagnosis not present

## 2017-08-24 DIAGNOSIS — H25013 Cortical age-related cataract, bilateral: Secondary | ICD-10-CM | POA: Diagnosis not present

## 2017-08-30 ENCOUNTER — Other Ambulatory Visit: Payer: Self-pay | Admitting: Family

## 2017-09-01 ENCOUNTER — Other Ambulatory Visit: Payer: Self-pay | Admitting: *Deleted

## 2017-09-03 ENCOUNTER — Other Ambulatory Visit (INDEPENDENT_AMBULATORY_CARE_PROVIDER_SITE_OTHER): Payer: Medicare Other

## 2017-09-03 ENCOUNTER — Encounter: Payer: Self-pay | Admitting: Internal Medicine

## 2017-09-03 ENCOUNTER — Ambulatory Visit (INDEPENDENT_AMBULATORY_CARE_PROVIDER_SITE_OTHER): Payer: Medicare Other | Admitting: Internal Medicine

## 2017-09-03 VITALS — BP 130/90 | HR 60 | Temp 98.2°F | Ht 67.0 in | Wt 211.0 lb

## 2017-09-03 DIAGNOSIS — R7301 Impaired fasting glucose: Secondary | ICD-10-CM

## 2017-09-03 DIAGNOSIS — Z8546 Personal history of malignant neoplasm of prostate: Secondary | ICD-10-CM

## 2017-09-03 DIAGNOSIS — E782 Mixed hyperlipidemia: Secondary | ICD-10-CM | POA: Diagnosis not present

## 2017-09-03 DIAGNOSIS — K219 Gastro-esophageal reflux disease without esophagitis: Secondary | ICD-10-CM | POA: Diagnosis not present

## 2017-09-03 DIAGNOSIS — I10 Essential (primary) hypertension: Secondary | ICD-10-CM

## 2017-09-03 LAB — CBC
HCT: 42.7 % (ref 39.0–52.0)
Hemoglobin: 14.2 g/dL (ref 13.0–17.0)
MCHC: 33.2 g/dL (ref 30.0–36.0)
MCV: 86.2 fl (ref 78.0–100.0)
PLATELETS: 266 10*3/uL (ref 150.0–400.0)
RBC: 4.96 Mil/uL (ref 4.22–5.81)
RDW: 13.3 % (ref 11.5–15.5)
WBC: 4.7 10*3/uL (ref 4.0–10.5)

## 2017-09-03 LAB — PSA: PSA: 1.76 ng/mL (ref 0.10–4.00)

## 2017-09-03 LAB — COMPREHENSIVE METABOLIC PANEL
ALBUMIN: 4.2 g/dL (ref 3.5–5.2)
ALT: 24 U/L (ref 0–53)
AST: 27 U/L (ref 0–37)
Alkaline Phosphatase: 77 U/L (ref 39–117)
BUN: 21 mg/dL (ref 6–23)
CALCIUM: 9.4 mg/dL (ref 8.4–10.5)
CHLORIDE: 102 meq/L (ref 96–112)
CO2: 29 meq/L (ref 19–32)
Creatinine, Ser: 1.31 mg/dL (ref 0.40–1.50)
GFR: 69.81 mL/min (ref >60.00)
Glucose, Bld: 96 mg/dL (ref 70–99)
Potassium: 3.6 mEq/L (ref 3.5–5.1)
Sodium: 141 mEq/L (ref 135–145)
Total Bilirubin: 0.7 mg/dL (ref 0.2–1.2)
Total Protein: 7.2 g/dL (ref 6.0–8.3)

## 2017-09-03 LAB — HEMOGLOBIN A1C: Hgb A1c MFr Bld: 6 % (ref 4.6–6.5)

## 2017-09-03 MED ORDER — LISINOPRIL 10 MG PO TABS: ORAL_TABLET | ORAL | 3 refills | Status: DC

## 2017-09-03 MED ORDER — METOPROLOL TARTRATE 25 MG PO TABS: ORAL_TABLET | ORAL | 3 refills | Status: DC

## 2017-09-03 MED ORDER — HYDROCHLOROTHIAZIDE 25 MG PO TABS: 25.0000 mg | ORAL_TABLET | Freq: Every day | ORAL | 3 refills | Status: DC

## 2017-09-03 MED ORDER — TAMSULOSIN HCL 0.4 MG PO CAPS: 0.4000 mg | ORAL_CAPSULE | Freq: Two times a day (BID) | ORAL | 3 refills | Status: DC

## 2017-09-03 MED ORDER — EZETIMIBE 10 MG PO TABS: 10.0000 mg | ORAL_TABLET | Freq: Every day | ORAL | 3 refills | Status: DC

## 2017-09-03 MED ORDER — PANTOPRAZOLE SODIUM 40 MG PO TBEC: 40.0000 mg | DELAYED_RELEASE_TABLET | Freq: Every day | ORAL | 3 refills | Status: DC

## 2017-09-03 NOTE — Assessment & Plan Note (Signed)
Taking protonix, asked to watch symptoms and if >1-2 times per week let us know. We may add pepcid qhs at that time. Okay to use tums prn and protonix prior to breakfast.

## 2017-09-03 NOTE — Patient Instructions (Signed)
We will check the labs today and call you back about the results.    Health Maintenance, Male A healthy lifestyle and preventive care is important for your health and wellness. Ask your health care provider about what schedule of regular examinations is right for you. What should I know about weight and diet? Eat a Healthy Diet  Eat plenty of vegetables, fruits, whole grains, low-fat dairy products, and lean protein.  Do not eat a lot of foods high in solid fats, added sugars, or salt.  Maintain a Healthy Weight Regular exercise can help you achieve or maintain a healthy weight. You should:  Do at least 150 minutes of exercise each week. The exercise should increase your heart rate and make you sweat (moderate-intensity exercise).  Do strength-training exercises at least twice a week.  Watch Your Levels of Cholesterol and Blood Lipids  Have your blood tested for lipids and cholesterol every 5 years starting at 69 years of age. If you are at high risk for heart disease, you should start having your blood tested when you are 69 years old. You may need to have your cholesterol levels checked more often if: ? Your lipid or cholesterol levels are high. ? You are older than 69 years of age. ? You are at high risk for heart disease.  What should I know about cancer screening? Many types of cancers can be detected early and may often be prevented. Lung Cancer  You should be screened every year for lung cancer if: ? You are a current smoker who has smoked for at least 30 years. ? You are a former smoker who has quit within the past 15 years.  Talk to your health care provider about your screening options, when you should start screening, and how often you should be screened.  Colorectal Cancer  Routine colorectal cancer screening usually begins at 69 years of age and should be repeated every 5-10 years until you are 69 years old. You may need to be screened more often if early forms of  precancerous polyps or small growths are found. Your health care provider may recommend screening at an earlier age if you have risk factors for colon cancer.  Your health care provider may recommend using home test kits to check for hidden blood in the stool.  A small camera at the end of a tube can be used to examine your colon (sigmoidoscopy or colonoscopy). This checks for the earliest forms of colorectal cancer.  Prostate and Testicular Cancer  Depending on your age and overall health, your health care provider may do certain tests to screen for prostate and testicular cancer.  Talk to your health care provider about any symptoms or concerns you have about testicular or prostate cancer.  Skin Cancer  Check your skin from head to toe regularly.  Tell your health care provider about any new moles or changes in moles, especially if: ? There is a change in a mole's size, shape, or color. ? You have a mole that is larger than a pencil eraser.  Always use sunscreen. Apply sunscreen liberally and repeat throughout the day.  Protect yourself by wearing long sleeves, pants, a wide-brimmed hat, and sunglasses when outside.  What should I know about heart disease, diabetes, and high blood pressure?  If you are 12-37 years of age, have your blood pressure checked every 3-5 years. If you are 39 years of age or older, have your blood pressure checked every year. You should have  your blood pressure measured twice-once when you are at a hospital or clinic, and once when you are not at a hospital or clinic. Record the average of the two measurements. To check your blood pressure when you are not at a hospital or clinic, you can use: ? An automated blood pressure machine at a pharmacy. ? A home blood pressure monitor.  Talk to your health care provider about your target blood pressure.  If you are between 45-79 years old, ask your health care provider if you should take aspirin to prevent heart  disease.  Have regular diabetes screenings by checking your fasting blood sugar level. ? If you are at a normal weight and have a low risk for diabetes, have this test once every three years after the age of 45. ? If you are overweight and have a high risk for diabetes, consider being tested at a younger age or more often.  A one-time screening for abdominal aortic aneurysm (AAA) by ultrasound is recommended for men aged 65-75 years who are current or former smokers. What should I know about preventing infection? Hepatitis B If you have a higher risk for hepatitis B, you should be screened for this virus. Talk with your health care provider to find out if you are at risk for hepatitis B infection. Hepatitis C Blood testing is recommended for:  Everyone born from 1945 through 1965.  Anyone with known risk factors for hepatitis C.  Sexually Transmitted Diseases (STDs)  You should be screened each year for STDs including gonorrhea and chlamydia if: ? You are sexually active and are younger than 69 years of age. ? You are older than 69 years of age and your health care provider tells you that you are at risk for this type of infection. ? Your sexual activity has changed since you were last screened and you are at an increased risk for chlamydia or gonorrhea. Ask your health care provider if you are at risk.  Talk with your health care provider about whether you are at high risk of being infected with HIV. Your health care provider may recommend a prescription medicine to help prevent HIV infection.  What else can I do?  Schedule regular health, dental, and eye exams.  Stay current with your vaccines (immunizations).  Do not use any tobacco products, such as cigarettes, chewing tobacco, and e-cigarettes. If you need help quitting, ask your health care provider.  Limit alcohol intake to no more than 2 drinks per day. One drink equals 12 ounces of beer, 5 ounces of wine, or 1 ounces of  hard liquor.  Do not use street drugs.  Do not share needles.  Ask your health care provider for help if you need support or information about quitting drugs.  Tell your health care provider if you often feel depressed.  Tell your health care provider if you have ever been abused or do not feel safe at home. This information is not intended to replace advice given to you by your health care provider. Make sure you discuss any questions you have with your health care provider. Document Released: 11/07/2007 Document Revised: 01/08/2016 Document Reviewed: 02/12/2015 Elsevier Interactive Patient Education  2018 Elsevier Inc.  

## 2017-09-03 NOTE — Assessment & Plan Note (Signed)
Seeing cardiology for repatha and still taking zetia. Problems with statins of elevated LFT and muscle weakness. Last LDL at goal.

## 2017-09-03 NOTE — Assessment & Plan Note (Signed)
Will refill metoprolol, hctz, lisinopril and checking CMP. Adjust as needed. BP mildly elevated from usual today off metoprolol so likely needs to continue.

## 2017-09-03 NOTE — Assessment & Plan Note (Signed)
About 7 years out from treatment. Checking PSA today. Refill tamsulosin.

## 2017-09-03 NOTE — Progress Notes (Signed)
   Subjective:    Patient ID: Justin Ayala, male    DOB: 06/01/1950, 67 y.o.   MRN: [id number]  HPI The patient is a 67 YO man coming in for follow up of blood pressure (claims to be taking metoprolol, hctz, lisinopril without complaints, last refill of metoprolol should have run out about 6 months ago, denies headaches or migraines, no chest pains or SOB, states out of metoprolol for about 1 day), GERD (taking protonix daily and some symptoms still, taking tums and they resolve, he is not sure how often but not very often, usually during the day, denies waking up with symptoms, denies blood in stool), history of prostate cancer (needs PSA, not seeing urologist anymore, taking tamsulosin BID for symptoms). Exercises daily.   Review of Systems  Constitutional: Negative.   HENT: Negative.   Eyes: Negative.   Respiratory: Negative for cough, chest tightness and shortness of breath.   Cardiovascular: Negative for chest pain, palpitations and leg swelling.  Gastrointestinal: Negative for abdominal distention, abdominal pain, constipation, diarrhea, nausea and vomiting.       Gerd  Musculoskeletal: Negative.   Skin: Negative.   Neurological: Negative.   Psychiatric/Behavioral: Negative.       Objective:   Physical Exam  Constitutional: He is oriented to person, place, and time. He appears well-developed and well-nourished.  HENT:  Head: Normocephalic and atraumatic.  Eyes: EOM are normal.  Neck: Normal range of motion.  Cardiovascular: Normal rate and regular rhythm.  Pulmonary/Chest: Effort normal and breath sounds normal. No respiratory distress. He has no wheezes. He has no rales.  Abdominal: Soft. Bowel sounds are normal. He exhibits no distension. There is no tenderness. There is no rebound.  Musculoskeletal: He exhibits no edema.  Neurological: He is alert and oriented to person, place, and time. Coordination normal.  Skin: Skin is warm and dry.  Psychiatric: He has a normal mood  and affect.   Vitals:   09/03/17 0913  BP: 130/90  Pulse: 60  Temp: 98.2 F (36.8 C)  TempSrc: Oral  SpO2: 97%  Weight: 211 lb (95.7 kg)  Height: 5\' 7"  (1.702 m)      Assessment & Plan:

## 2017-09-07 NOTE — Progress Notes (Signed)
Mailed out copy of labs to patient today.

## 2017-09-10 DIAGNOSIS — H9313 Tinnitus, bilateral: Secondary | ICD-10-CM | POA: Diagnosis not present

## 2017-09-10 DIAGNOSIS — Z822 Family history of deafness and hearing loss: Secondary | ICD-10-CM | POA: Diagnosis not present

## 2017-09-10 DIAGNOSIS — Z570 Occupational exposure to noise: Secondary | ICD-10-CM | POA: Diagnosis not present

## 2017-09-10 DIAGNOSIS — H903 Sensorineural hearing loss, bilateral: Secondary | ICD-10-CM | POA: Diagnosis not present

## 2017-10-26 ENCOUNTER — Other Ambulatory Visit: Payer: Self-pay | Admitting: Internal Medicine

## 2017-11-29 DIAGNOSIS — C61 Malignant neoplasm of prostate: Secondary | ICD-10-CM | POA: Diagnosis not present

## 2017-12-07 DIAGNOSIS — Z8546 Personal history of malignant neoplasm of prostate: Secondary | ICD-10-CM | POA: Diagnosis not present

## 2017-12-07 DIAGNOSIS — R9721 Rising PSA following treatment for malignant neoplasm of prostate: Secondary | ICD-10-CM | POA: Diagnosis not present

## 2017-12-07 DIAGNOSIS — N5235 Erectile dysfunction following radiation therapy: Secondary | ICD-10-CM | POA: Diagnosis not present

## 2018-02-02 ENCOUNTER — Other Ambulatory Visit: Payer: Self-pay | Admitting: Internal Medicine

## 2018-02-11 ENCOUNTER — Other Ambulatory Visit: Payer: Self-pay | Admitting: Pharmacist Clinician (PhC)/ Clinical Pharmacy Specialist

## 2018-02-11 ENCOUNTER — Telehealth: Payer: Self-pay | Admitting: Cardiovascular Disease

## 2018-02-11 NOTE — Telephone Encounter (Signed)
°*  STAT* If patient is at the pharmacy, call can be transferred to refill team.   1. Which medications need to be refilled? (please list name of each medication and dose if known) repatha pushtronex system  420mg /.35 ml  2. Which pharmacy/location (including street and city if local pharmacy) is medication to be sent to? To       3. Do they need a 30 day or 90 day supply? 90 shots

## 2018-02-11 NOTE — Telephone Encounter (Signed)
Thanks for the update. Rx sent to [facility] safety Net

## 2018-02-14 ENCOUNTER — Telehealth: Payer: Self-pay | Admitting: Pharmacist

## 2018-02-14 DIAGNOSIS — E7800 Pure hypercholesterolemia, unspecified: Secondary | ICD-10-CM

## 2018-02-14 NOTE — Telephone Encounter (Signed)
Samples of this drug were given to the patient   quantity 1;  Lot Number [id number]; Exp 02/2019   *Orders for fasting lipid panel also provided*

## 2018-03-21 ENCOUNTER — Telehealth: Payer: Self-pay | Admitting: Pharmacist

## 2018-03-21 NOTE — Telephone Encounter (Signed)
A letter is being sent to the last known home address.  [employer] safety net form for 2020 and orders for lipid panel mailed today 03/21/18   Medication Samples  provided to the patient.  Drug name: Repatha     Strength: 420mg         Qty: 1 LOT: [id number]  Exp.Date:12/20    Cici Rodriges Rodriguez-Guzman PharmD, BCPS, [place] [id number] 03/21/2018 12:58 PM

## 2018-03-28 ENCOUNTER — Encounter: Payer: Self-pay | Admitting: Internal Medicine

## 2018-04-04 DIAGNOSIS — E7800 Pure hypercholesterolemia, unspecified: Secondary | ICD-10-CM | POA: Diagnosis not present

## 2018-04-05 LAB — LIPID PANEL
CHOLESTEROL TOTAL: 124 mg/dL (ref 100–199)
Chol/HDL Ratio: 2.5 ratio (ref 0.0–5.0)
HDL: 50 mg/dL (ref >39)
LDL Calculated: 51 mg/dL (ref 0–99)
Triglycerides: 115 mg/dL (ref 0–149)
VLDL Cholesterol Cal: 23 mg/dL (ref 5–40)

## 2018-04-25 ENCOUNTER — Telehealth: Payer: Self-pay

## 2018-04-25 NOTE — Telephone Encounter (Signed)
Pt calling regarding repatha asking specifically for Raquel

## 2018-04-26 NOTE — Telephone Encounter (Signed)
Spoke with patient.  He had labs done in November, however will be changing insurance on January 1.  Patient aware that he will need to call with information from new card once it arrives so that we can bill the correct insurance on January 1.

## 2018-05-20 ENCOUNTER — Encounter: Payer: Self-pay | Admitting: Cardiovascular Disease

## 2018-05-20 ENCOUNTER — Ambulatory Visit (INDEPENDENT_AMBULATORY_CARE_PROVIDER_SITE_OTHER): Payer: Medicare Other | Admitting: Cardiovascular Disease

## 2018-05-20 ENCOUNTER — Telehealth: Payer: Self-pay

## 2018-05-20 DIAGNOSIS — I2510 Atherosclerotic heart disease of native coronary artery without angina pectoris: Secondary | ICD-10-CM

## 2018-05-20 DIAGNOSIS — I10 Essential (primary) hypertension: Secondary | ICD-10-CM | POA: Diagnosis not present

## 2018-05-20 DIAGNOSIS — E7800 Pure hypercholesterolemia, unspecified: Secondary | ICD-10-CM | POA: Diagnosis not present

## 2018-05-20 NOTE — Assessment & Plan Note (Signed)
History of hyperlipidemia on Repatha and Zetia with recent lipid profile performed 04/04/2018 revealing total cholesterol 124, LDL 51 and HDL 50.

## 2018-05-20 NOTE — Assessment & Plan Note (Signed)
History of CAD status post cardiac catheterization performed by myself 10/12/2008 revealing a 60 to 70% proximal first diagonal branch stenosis and a 50% distal dominant circumflex and the PDA with normal LV function.  He denies chest pain or shortness of breath.

## 2018-05-20 NOTE — Patient Instructions (Signed)
Medication Instructions:  NONE If you need a refill on your cardiac medications before your next appointment, please call your pharmacy.   Lab work: NONE If you have labs (blood work) drawn today and your tests are completely normal, you will receive your results only by: [name] MyChart Message (if you have MyChart) OR . A paper copy in the mail If you have any lab test that is abnormal or we need to change your treatment, we will call you to review the results.  Testing/Procedures: NONE  Follow-Up: At [facility], you and your health needs are our priority.  As part of our continuing mission to provide you with exceptional heart care, we have created designated Provider Care Teams.  These Care Teams include your primary Cardiologist (physician) and Advanced Practice Providers (APPs -  Physician Assistants and Nurse Practitioners) who all work together to provide you with the care you need, when you need it. You will need a follow up appointment in 12 months.  Please call our office 2 months in advance to schedule this appointment.  You may see DR. BERRY  or one of the following Advanced Practice Providers on your designated Care Team:   Kerin Ransom, PA-C [place], [place] . Sande Rives, PA-C

## 2018-05-20 NOTE — Progress Notes (Signed)
05/20/2018 Justin Ayala   July 28, 1950  [id number]  Primary Physician Hoyt Koch, MD Primary Cardiologist: Lorretta Harp MD Garret Reddish, [place], [place]  HPI:  Justin Ayala is a 67 y.o.  mildly overweight married African American male, father of 18, grandfather to 4 grandchildren, who I last saw office 04/27/2017... He has a history of moderate, but not critical, CAD by catheterization, which I performed Oct 12, 2008. He had a 60% to 70% proximal first diagonal branch stenosis. He had 50% distal dominant circumflex stenosis in the PDA with normal LV function. His other problems include hypertension and hyperlipidemia. He does not smoke. He has been exercising more recently. He has had a gastric polyp in the past, found in the setting of a GI workup for GI bleed. He was transfused at that time.his lipid profile followed by his primary care physician. He denies chest pain or shortness of breath. Since I saw him last he was complaining of lower extremity weakness which resolved after he stopped his Lipitor on his own. He did have elevated liver function tests as well followed by Dr. Benson Norway. because of his statin intolerance and his elevated LDL of 166 measured on 09/29/16 he was begun on Repatha approximately 3 months ago which he is tolerating well. Otherwise, he denies chest pain or shortness of breath. Since I saw him a year ago he is remained completely asymptomatic denying chest pain or shortness of breath.  Current Meds  Medication Sig  . ASPIRIN ADULT LOW STRENGTH 81 MG EC tablet TAKE 1 BY MOUTH DAILY  . Cholecalciferol (VITAMIN D-3) 1000 UNITS CAPS Take by mouth. Take one daily  . Evolocumab with Infusor ([place]) 420 MG/3.5ML SOCT Inject 420 mg into the skin every 30 (thirty) days.  [name] ezetimibe (ZETIA) 10 MG tablet Take 1 tablet (10 mg total) by mouth daily.  . ferrous sulfate 325 (65 FE) MG tablet Take 1 tablet (325 mg total) by mouth daily.  . hydrochlorothiazide  (HYDRODIURIL) 25 MG tablet Take 1 tablet (25 mg total) by mouth daily.  Coralyn Pear Ginseng 100 MG CAPS Take 1 capsule (100 mg total) by mouth daily.  [name] lisinopril (PRINIVIL,ZESTRIL) 10 MG tablet TAKE 1/2 (ONE-HALF) TABLET BY MOUTH ONCE DAILY  . meloxicam (MOBIC) 7.5 MG tablet TAKE 1 TABLET BY MOUTH ONCE DAILY  . metoprolol tartrate (LOPRESSOR) 25 MG tablet TAKE 1/2 BY MOUTH TWICE DAILY  (GENERIC FOR LOPRESSOR)  . pantoprazole (PROTONIX) 40 MG tablet Take 1 tablet (40 mg total) by mouth daily.  . tamsulosin (FLOMAX) 0.4 MG CAPS capsule Take 1 capsule (0.4 mg total) by mouth 2 (two) times daily.     Allergies  Allergen Reactions  . Ace Inhibitors Cough    Social History   Socioeconomic History  . Marital status: Married    Spouse name: Not on file  . Number of children: 3  . Years of education: BS   . Highest education level: Not on file  Occupational History    Employer: [employer]  . Financial resource strain: Not on file  . Food insecurity:    Worry: Not on file    Inability: Not on file  . Transportation needs:    Medical: Not on file    Non-medical: Not on file  Tobacco Use  . Smoking status: Never Smoker  . Smokeless tobacco: Never Used  Substance and Sexual Activity  . Alcohol use: No  . Drug use: No  . Sexual  activity: Yes    Partners: Female  Lifestyle  . Physical activity:    Days per week: Not on file    Minutes per session: Not on file  . Stress: Not on file  Relationships  . Social connections:    Talks on phone: Not on file    Gets together: Not on file    Attends religious service: Not on file    Active member of club or organization: Not on file    Attends meetings of clubs or organizations: Not on file    Relationship status: Not on file  . Intimate partner violence:    Fear of current or ex partner: Not on file    Emotionally abused: Not on file    Physically abused: Not on file    Forced sexual activity: Not on file  Other Topics  Concern  . Not on file  Social History Narrative   married x 30+years; 3 children, 4 grandsons    no smoking or ETOH;    Retired Merchant navy officer formerly at [employer];    likes to golf (golfs 3-4 times a week); no exercise and rides cart while golfing           Review of Systems: General: negative for chills, fever, night sweats or weight changes.  Cardiovascular: negative for chest pain, dyspnea on exertion, edema, orthopnea, palpitations, paroxysmal nocturnal dyspnea or shortness of breath Dermatological: negative for rash Respiratory: negative for cough or wheezing Urologic: negative for hematuria Abdominal: negative for nausea, vomiting, diarrhea, bright red blood per rectum, melena, or hematemesis Neurologic: negative for visual changes, syncope, or dizziness All other systems reviewed and are otherwise negative except as noted above.    Blood pressure 128/80, pulse (!) 58, height 5\' 7"  (1.702 m), weight 197 lb (89.4 kg).  General appearance: alert and no distress Neck: no adenopathy, no carotid bruit, no JVD, supple, symmetrical, trachea midline and thyroid not enlarged, symmetric, no tenderness/mass/nodules Lungs: clear to auscultation bilaterally Heart: regular rate and rhythm, S1, S2 normal, no murmur, click, rub or gallop Extremities: extremities normal, atraumatic, no cyanosis or edema Pulses: 2+ and symmetric Skin: Skin color, texture, turgor normal. No rashes or lesions Neurologic: Alert and oriented X 3, normal strength and tone. Normal symmetric reflexes. Normal coordination and gait  EKG sinus bradycardia 58 poor R wave progression.  I personally reviewed this EKG.  ASSESSMENT AND PLAN:   Hyperlipidemia History of hyperlipidemia on Repatha and Zetia with recent lipid profile performed 04/04/2018 revealing total cholesterol 124, LDL 51 and HDL 50.  HYPERTENSION, BENIGN SYSTEMIC History of essential hypertension her blood pressure measured today 128/80.   He is on hydrochlorothiazide, lisinopril metoprolol.  CAD (coronary artery disease) History of CAD status post cardiac catheterization performed by myself 10/12/2008 revealing a 6 to 70% proximal first diagonal branch stenosis and a 50% distal dominant circumflex and the PDA with normal LV function.  He denies chest pain or shortness of breath.      Lorretta Harp MD FACP,FACC,FAHA, [facility] 05/20/2018 4:01 PM

## 2018-05-20 NOTE — Assessment & Plan Note (Signed)
History of essential hypertension her blood pressure measured today 128/80.  He is on hydrochlorothiazide, lisinopril metoprolol.

## 2018-05-24 NOTE — Telephone Encounter (Signed)
ERROR

## 2018-08-01 DIAGNOSIS — Z86010 Personal history of colonic polyps: Secondary | ICD-10-CM | POA: Diagnosis not present

## 2018-08-01 DIAGNOSIS — R740 Nonspecific elevation of levels of transaminase and lactic acid dehydrogenase [LDH]: Secondary | ICD-10-CM | POA: Diagnosis not present

## 2018-08-01 DIAGNOSIS — I10 Essential (primary) hypertension: Secondary | ICD-10-CM | POA: Diagnosis not present

## 2018-08-04 ENCOUNTER — Telehealth: Payer: Self-pay | Admitting: *Deleted

## 2018-08-04 NOTE — Telephone Encounter (Signed)
Dr. Adora Fridge can pt hold asprin for 5 days for colonoscopy?

## 2018-08-04 NOTE — Telephone Encounter (Signed)
   [facility] Pre-operative Risk Assessment    Request for surgical clearance:  1. What type of surgery is being performed? COLOSCOPY    2. When is this surgery scheduled? 08/11/18   3. What type of clearance is required (medical clearance vs. Pharmacy clearance to hold med vs. Both)?   4. Are there any medications that need to be held prior to surgery and how long?   5. Practice name and name of physician performing surgery? [facility]    6. What is your office phone number? [phone]     7.   What is your office fax number? [phone]  8.   Anesthesia type (None, local, MAC, general) ? PROPOFOL

## 2018-08-05 NOTE — Telephone Encounter (Signed)
Okay to hold aspirin for his colonoscopy.

## 2018-08-05 NOTE — Telephone Encounter (Signed)
   Primary Cardiologist: Quay Burow, MD  Chart reviewed as part of pre-operative protocol coverage. Given past medical history and time since last visit, based on ACC/AHA guidelines, Justin Ayala would be at acceptable risk for the planned procedure without further cardiovascular testing.   Ok to hold ASA 5 days prior to colonoscopy.   I will route this recommendation to the requesting party via Epic fax function and remove from pre-op pool.  Please call with questions.  Lyda Jester, PA-C 08/05/2018, 1:27 PM

## 2018-08-27 ENCOUNTER — Other Ambulatory Visit: Payer: Self-pay | Admitting: Internal Medicine

## 2018-08-30 ENCOUNTER — Telehealth: Payer: Self-pay | Admitting: Pharmacist

## 2018-08-30 MED ORDER — LISINOPRIL 10 MG PO TABS: 10.0000 mg | ORAL_TABLET | Freq: Every day | ORAL | 0 refills | Status: DC

## 2018-08-30 NOTE — Telephone Encounter (Signed)
See mychart encounter for details

## 2018-08-30 NOTE — Telephone Encounter (Signed)
Justin Ayala, it seems that Mr. Justin Ayala's blood pressures are borderline elevated.  I would increase his lisinopril and then have him wean off his metoprolol over the next 2 to 3 weeks and see if the ringing stops.

## 2018-08-30 NOTE — Telephone Encounter (Signed)
  Patient states that he has not stopped taking metoprolol. He would like to know if it is still okay to do what you told him to do.

## 2018-09-01 ENCOUNTER — Telehealth: Payer: Self-pay

## 2018-09-01 NOTE — Telephone Encounter (Signed)
LVM for patient to call back and reschedule his appointment that is on Monday 09/05/2018 or if he is able to do a virtual we can get him set up for that

## 2018-09-05 ENCOUNTER — Ambulatory Visit: Payer: Medicare Other | Admitting: Internal Medicine

## 2018-09-07 ENCOUNTER — Encounter: Payer: Self-pay | Admitting: Internal Medicine

## 2018-09-07 DIAGNOSIS — H2513 Age-related nuclear cataract, bilateral: Secondary | ICD-10-CM | POA: Diagnosis not present

## 2018-09-07 DIAGNOSIS — H35033 Hypertensive retinopathy, bilateral: Secondary | ICD-10-CM | POA: Diagnosis not present

## 2018-09-07 DIAGNOSIS — H40023 Open angle with borderline findings, high risk, bilateral: Secondary | ICD-10-CM | POA: Diagnosis not present

## 2018-09-07 DIAGNOSIS — H35013 Changes in retinal vascular appearance, bilateral: Secondary | ICD-10-CM | POA: Diagnosis not present

## 2018-09-07 DIAGNOSIS — H40013 Open angle with borderline findings, low risk, bilateral: Secondary | ICD-10-CM | POA: Diagnosis not present

## 2018-09-23 MED ORDER — LISINOPRIL 20 MG PO TABS: 20.0000 mg | ORAL_TABLET | Freq: Every day | ORAL | 3 refills | Status: DC

## 2018-09-23 NOTE — Telephone Encounter (Signed)
Routed to MD, primary nurse, and CVRR as they have been in discussion with patient concerning BPs

## 2018-10-01 DIAGNOSIS — Z0100 Encounter for examination of eyes and vision without abnormal findings: Secondary | ICD-10-CM | POA: Diagnosis not present

## 2018-10-05 ENCOUNTER — Other Ambulatory Visit: Payer: Self-pay | Admitting: Internal Medicine

## 2018-10-14 ENCOUNTER — Other Ambulatory Visit: Payer: Self-pay | Admitting: Internal Medicine

## 2018-10-19 ENCOUNTER — Other Ambulatory Visit: Payer: Self-pay | Admitting: Internal Medicine

## 2018-10-24 ENCOUNTER — Encounter: Payer: Self-pay | Admitting: Internal Medicine

## 2018-10-24 ENCOUNTER — Other Ambulatory Visit (INDEPENDENT_AMBULATORY_CARE_PROVIDER_SITE_OTHER): Payer: Medicare HMO

## 2018-10-24 ENCOUNTER — Other Ambulatory Visit: Payer: Self-pay

## 2018-10-24 ENCOUNTER — Other Ambulatory Visit: Payer: Self-pay | Admitting: Internal Medicine

## 2018-10-24 ENCOUNTER — Ambulatory Visit (INDEPENDENT_AMBULATORY_CARE_PROVIDER_SITE_OTHER): Payer: Medicare HMO | Admitting: Internal Medicine

## 2018-10-24 VITALS — BP 128/82 | HR 63 | Temp 98.4°F | Ht 67.0 in | Wt 199.0 lb

## 2018-10-24 DIAGNOSIS — Z0000 Encounter for general adult medical examination without abnormal findings: Secondary | ICD-10-CM | POA: Diagnosis not present

## 2018-10-24 DIAGNOSIS — K219 Gastro-esophageal reflux disease without esophagitis: Secondary | ICD-10-CM

## 2018-10-24 DIAGNOSIS — I10 Essential (primary) hypertension: Secondary | ICD-10-CM

## 2018-10-24 DIAGNOSIS — Z8546 Personal history of malignant neoplasm of prostate: Secondary | ICD-10-CM

## 2018-10-24 DIAGNOSIS — E7800 Pure hypercholesterolemia, unspecified: Secondary | ICD-10-CM

## 2018-10-24 DIAGNOSIS — R7303 Prediabetes: Secondary | ICD-10-CM | POA: Diagnosis not present

## 2018-10-24 LAB — LIPID PANEL
Cholesterol: 153 mg/dL (ref 0–200)
HDL: 49.2 mg/dL (ref >39.00)
LDL Cholesterol: 80 mg/dL (ref 0–99)
NonHDL: 104.24
Total CHOL/HDL Ratio: 3
Triglycerides: 119 mg/dL (ref 0.0–149.0)
VLDL: 23.8 mg/dL (ref 0.0–40.0)

## 2018-10-24 LAB — COMPREHENSIVE METABOLIC PANEL
ALT: 20 U/L (ref 0–53)
AST: 24 U/L (ref 0–37)
Albumin: 4.2 g/dL (ref 3.5–5.2)
Alkaline Phosphatase: 90 U/L (ref 39–117)
BUN: 18 mg/dL (ref 6–23)
CO2: 28 mEq/L (ref 19–32)
Calcium: 9.6 mg/dL (ref 8.4–10.5)
Chloride: 103 mEq/L (ref 96–112)
Creatinine, Ser: 1.37 mg/dL (ref 0.40–1.50)
GFR: 62.17 mL/min (ref >60.00)
Glucose, Bld: 85 mg/dL (ref 70–99)
Potassium: 3.7 mEq/L (ref 3.5–5.1)
Sodium: 140 mEq/L (ref 135–145)
Total Bilirubin: 0.5 mg/dL (ref 0.2–1.2)
Total Protein: 7.4 g/dL (ref 6.0–8.3)

## 2018-10-24 LAB — CBC
HCT: 42.7 % (ref 39.0–52.0)
Hemoglobin: 14.3 g/dL (ref 13.0–17.0)
MCHC: 33.5 g/dL (ref 30.0–36.0)
MCV: 85.9 fl (ref 78.0–100.0)
Platelets: 258 10*3/uL (ref 150.0–400.0)
RBC: 4.96 Mil/uL (ref 4.22–5.81)
RDW: 13.6 % (ref 11.5–15.5)
WBC: 4.7 10*3/uL (ref 4.0–10.5)

## 2018-10-24 LAB — HEMOGLOBIN A1C: Hgb A1c MFr Bld: 5.9 % (ref 4.6–6.5)

## 2018-10-24 LAB — PSA: PSA: 2.91 ng/mL (ref 0.10–4.00)

## 2018-10-24 NOTE — Assessment & Plan Note (Signed)
Checking lipid panel and taking repatha and zetia. Adjust as needed.

## 2018-10-24 NOTE — Progress Notes (Signed)
Subjective:   Patient ID: Justin Ayala, male    DOB: 1951-01-04, 68 y.o.   MRN: [id number]  HPI Here for medicare wellness and physical, no new complaints. Please see A/P for status and treatment of chronic medical problems.   Diet: heart healthy Physical activity: sedentary Depression/mood screen: negative Hearing: intact to whispered voice, chronic tinnitus which is distractable Visual acuity: grossly normal with lens, performs annual eye exam  ADLs: capable Fall risk: none Home safety: good Cognitive evaluation: intact to orientation, naming, recall and repetition EOL planning: adv directives discussed    Office Visit from 10/24/2018 in [facility]  PHQ-2 Total Score  0      I have personally reviewed and have noted 1. The patient's medical and social history - reviewed today no changes 2. Their use of alcohol, tobacco or illicit drugs 3. Their current medications and supplements 4. The patient's functional ability including ADL's, fall risks, home safety risks and hearing or visual impairment. 5. Diet and physical activities 6. Evidence for depression or mood disorders 7. Care team reviewed and updated  Patient Care Team: Hoyt Koch, MD as PCP - General (Internal Medicine) Lorretta Harp, MD as PCP - Cardiology (Cardiology) Past Medical History:  Diagnosis Date  . Allergic rhinitis   . Anemia 2010  . CAD (coronary artery disease)    60-70% prox 1st diagonal branch stenosis, 50% dominant circumflex stenosis in PDA, normal LV function (by 10/12/2008 cath)  . GERD (gastroesophageal reflux disease)   . History of cardiovascular stress test 10/02/2008   exercise tolerance test - abnormal test - subsequent cath   . HLD (hyperlipidemia)   . HTN (hypertension)   . Kidney stones   . Overweight(278.02)   . Peptic ulcer   . Prostate cancer [facility])    Past Surgical History:  Procedure Laterality Date  . APPENDECTOMY  12/2008   Dr. Clyda Greener  . BIOPSY PROSTATE  03/19/11   gleason 3+3=6, volume 34 cc, psa 01/06/11 5.00  . CARDIAC CATHETERIZATION  2010   non-critical stenosis  . FLEXIBLE SIGMOIDOSCOPY N/A 08/18/2013   Procedure: FLEXIBLE SIGMOIDOSCOPY;  Surgeon: Beryle Beams, MD;  Location: [facility];  Service: Endoscopy;  Laterality: N/A;  . HOT HEMOSTASIS N/A 08/18/2013   Procedure: HOT HEMOSTASIS (ARGON PLASMA COAGULATION/BICAP);  Surgeon: Beryle Beams, MD;  Location: [facility];  Service: Endoscopy;  Laterality: N/A;  AVM - sig colon  . RADIOACTIVE SEED IMPLANT  08/14/2011   Procedure: RADIOACTIVE SEED IMPLANT;  Surgeon: Claybon Jabs, MD;  Location: [facility];  Service: Urology;  Laterality: N/A;  30  SEEDS IMPLANTED IN PROSTATE  . TONSILLECTOMY     Family History  Problem Relation Age of Onset  . Hypertension Father        also organ failure  . Stroke Father   . Hypertension Mother   . Cervical cancer Mother   . Prostate cancer Brother        seed implant  . Heart failure Paternal Grandmother   . Heart failure Paternal Grandfather   . Diabetes Neg Hx    Review of Systems  Constitutional: Negative.   HENT: Negative.   Eyes: Negative.   Respiratory: Negative for cough, chest tightness and shortness of breath.   Cardiovascular: Negative for chest pain, palpitations and leg swelling.  Gastrointestinal: Negative for abdominal distention, abdominal pain, constipation, diarrhea, nausea and vomiting.  Musculoskeletal: Negative.   Skin: Negative.   Neurological: Negative.  Psychiatric/Behavioral: Negative.     Objective:  Physical Exam Constitutional:      Appearance: He is well-developed.  HENT:     Head: Normocephalic and atraumatic.  Neck:     Musculoskeletal: Normal range of motion.  Cardiovascular:     Rate and Rhythm: Normal rate and regular rhythm.  Pulmonary:     Effort: Pulmonary effort is normal. No respiratory distress.     Breath sounds: Normal breath sounds. No  wheezing or rales.  Abdominal:     General: Bowel sounds are normal. There is no distension.     Palpations: Abdomen is soft.     Tenderness: There is no abdominal tenderness. There is no rebound.  Skin:    General: Skin is warm and dry.  Neurological:     Mental Status: He is alert and oriented to person, place, and time.     Coordination: Coordination normal.     Vitals:   10/24/18 0929  BP: 128/82  Pulse: 63  Temp: 98.4 F (36.9 C)  TempSrc: Oral  SpO2: 98%  Weight: 199 lb (90.3 kg)  Height: 5\' 7"  (1.702 m)    Assessment & Plan:

## 2018-10-24 NOTE — Assessment & Plan Note (Signed)
Checking PSA today and he is seeing urology yearly still.

## 2018-10-24 NOTE — Assessment & Plan Note (Signed)
BP at goal on metoprolol, hctz and lisinopril. Checking CMP and adjust as needed.

## 2018-10-24 NOTE — Patient Instructions (Signed)
Health Maintenance After Age 70 After age 70, you are at a higher risk for certain long-term diseases and infections as well as injuries from falls. Falls are a major cause of broken bones and head injuries in people who are older than age 70. Getting regular preventive care can help to keep you healthy and well. Preventive care includes getting regular testing and making lifestyle changes as recommended by your health care provider. Talk with your health care provider about:  Which screenings and tests you should have. A screening is a test that checks for a disease when you have no symptoms.  A diet and exercise plan that is right for you. What should I know about screenings and tests to prevent falls? Screening and testing are the best ways to find a health problem early. Early diagnosis and treatment give you the best chance of managing medical conditions that are common after age 70. Certain conditions and lifestyle choices may make you more likely to have a fall. Your health care provider may recommend:  Regular vision checks. Poor vision and conditions such as cataracts can make you more likely to have a fall. If you wear glasses, make sure to get your prescription updated if your vision changes.  Medicine review. Work with your health care provider to regularly review all of the medicines you are taking, including over-the-counter medicines. Ask your health care provider about any side effects that may make you more likely to have a fall. Tell your health care provider if any medicines that you take make you feel dizzy or sleepy.  Osteoporosis screening. Osteoporosis is a condition that causes the bones to get weaker. This can make the bones weak and cause them to break more easily.  Blood pressure screening. Blood pressure changes and medicines to control blood pressure can make you feel dizzy.  Strength and balance checks. Your health care provider may recommend certain tests to check your  strength and balance while standing, walking, or changing positions.  Foot health exam. Foot pain and numbness, as well as not wearing proper footwear, can make you more likely to have a fall.  Depression screening. You may be more likely to have a fall if you have a fear of falling, feel emotionally low, or feel unable to do activities that you used to do.  Alcohol use screening. Using too much alcohol can affect your balance and may make you more likely to have a fall. What actions can I take to lower my risk of falls? General instructions  Talk with your health care provider about your risks for falling. Tell your health care provider if: ? You fall. Be sure to tell your health care provider about all falls, even ones that seem minor. ? You feel dizzy, sleepy, or off-balance.  Take over-the-counter and prescription medicines only as told by your health care provider. These include any supplements.  Eat a healthy diet and maintain a healthy weight. A healthy diet includes low-fat dairy products, low-fat (lean) meats, and fiber from whole grains, beans, and lots of fruits and vegetables. Home safety  Remove any tripping hazards, such as rugs, cords, and clutter.  Install safety equipment such as grab bars in bathrooms and safety rails on stairs.  Keep rooms and walkways well-lit. Activity   Follow a regular exercise program to stay fit. This will help you maintain your balance. Ask your health care provider what types of exercise are appropriate for you.  If you need a cane or   walker, use it as recommended by your health care provider.  Wear supportive shoes that have nonskid soles. Lifestyle  Do not drink alcohol if your health care provider tells you not to drink.  If you drink alcohol, limit how much you have: ? 0-1 drink a day for women. ? 0-2 drinks a day for men.  Be aware of how much alcohol is in your drink. In the [place] one drink equals one typical bottle of beer (12  oz), one-half glass of wine (5 oz), or one shot of hard liquor (1 oz).  Do not use any products that contain nicotine or tobacco, such as cigarettes and e-cigarettes. If you need help quitting, ask your health care provider. Summary  Having a healthy lifestyle and getting preventive care can help to protect your health and wellness after age 70.  Screening and testing are the best way to find a health problem early and help you avoid having a fall. Early diagnosis and treatment give you the best chance for managing medical conditions that are more common for people who are older than age 70.  Falls are a major cause of broken bones and head injuries in people who are older than age 70. Take precautions to prevent a fall at home.  Work with your health care provider to learn what changes you can make to improve your health and wellness and to prevent falls. This information is not intended to replace advice given to you by your health care provider. Make sure you discuss any questions you have with your health care provider. Document Released: 03/24/2017 Document Revised: 03/24/2017 Document Reviewed: 03/24/2017 Elsevier Interactive Patient Education  2019 Elsevier Inc.  

## 2018-10-24 NOTE — Assessment & Plan Note (Signed)
Taking protonix daily and rare symptoms in the evening with snacking right before bedtime.

## 2018-10-24 NOTE — Assessment & Plan Note (Signed)
Flu shot due fall 2020. Pneumonia declines. Shingrix declines. Tetanus due in 2024. Colonoscopy due in 2025. Counseled about sun safety and mole surveillance. Counseled about the dangers of distracted driving. Given 10 year screening recommendations.

## 2018-11-01 DIAGNOSIS — Z8546 Personal history of malignant neoplasm of prostate: Secondary | ICD-10-CM | POA: Diagnosis not present

## 2018-11-01 DIAGNOSIS — N5235 Erectile dysfunction following radiation therapy: Secondary | ICD-10-CM | POA: Diagnosis not present

## 2018-11-02 ENCOUNTER — Other Ambulatory Visit (HOSPITAL_COMMUNITY): Payer: Self-pay | Admitting: Urology

## 2018-11-02 DIAGNOSIS — Z8546 Personal history of malignant neoplasm of prostate: Secondary | ICD-10-CM

## 2018-11-15 ENCOUNTER — Other Ambulatory Visit: Payer: Self-pay

## 2018-11-15 ENCOUNTER — Ambulatory Visit (HOSPITAL_COMMUNITY)
Admission: RE | Admit: 2018-11-15 | Discharge: 2018-11-15 | Disposition: A | Discharge status: Home / Self Care | Payer: Medicare HMO | Source: Ambulatory Visit | Attending: Urology | Admitting: Urology

## 2018-11-15 DIAGNOSIS — Z8546 Personal history of malignant neoplasm of prostate: Secondary | ICD-10-CM | POA: Diagnosis not present

## 2018-11-15 DIAGNOSIS — C61 Malignant neoplasm of prostate: Secondary | ICD-10-CM | POA: Diagnosis not present

## 2018-11-15 MED ORDER — AXUMIN (FLUCICLOVINE F 18) INJECTION
9.2000 | Freq: Once | INTRAVENOUS | Status: AC
  Administered 2018-11-15: 9.2 via INTRAVENOUS

## 2018-12-04 ENCOUNTER — Other Ambulatory Visit: Payer: Self-pay | Admitting: Internal Medicine

## 2018-12-15 ENCOUNTER — Other Ambulatory Visit: Payer: Self-pay | Admitting: Internal Medicine

## 2019-01-16 ENCOUNTER — Other Ambulatory Visit: Payer: Self-pay | Admitting: Internal Medicine

## 2019-02-16 DIAGNOSIS — Z86010 Personal history of colonic polyps: Secondary | ICD-10-CM | POA: Diagnosis not present

## 2019-02-16 DIAGNOSIS — D124 Benign neoplasm of descending colon: Secondary | ICD-10-CM | POA: Diagnosis not present

## 2019-02-16 DIAGNOSIS — D125 Benign neoplasm of sigmoid colon: Secondary | ICD-10-CM | POA: Diagnosis not present

## 2019-02-16 DIAGNOSIS — K635 Polyp of colon: Secondary | ICD-10-CM | POA: Diagnosis not present

## 2019-03-13 ENCOUNTER — Other Ambulatory Visit: Payer: Self-pay | Admitting: Internal Medicine

## 2019-03-13 ENCOUNTER — Other Ambulatory Visit: Payer: Self-pay | Admitting: Family

## 2019-03-27 ENCOUNTER — Other Ambulatory Visit: Payer: Self-pay

## 2019-03-27 DIAGNOSIS — E7800 Pure hypercholesterolemia, unspecified: Secondary | ICD-10-CM

## 2019-03-27 NOTE — Progress Notes (Signed)
Lipid for pa

## 2019-04-14 DIAGNOSIS — E7800 Pure hypercholesterolemia, unspecified: Secondary | ICD-10-CM | POA: Diagnosis not present

## 2019-04-14 LAB — LIPID PANEL
Chol/HDL Ratio: 2.8 ratio (ref 0.0–5.0)
Cholesterol, Total: 148 mg/dL (ref 100–199)
HDL: 52 mg/dL (ref >39)
LDL Chol Calc (NIH): 77 mg/dL (ref 0–99)
Triglycerides: 101 mg/dL (ref 0–149)
VLDL Cholesterol Cal: 19 mg/dL (ref 5–40)

## 2019-04-24 DIAGNOSIS — H1131 Conjunctival hemorrhage, right eye: Secondary | ICD-10-CM | POA: Diagnosis not present

## 2019-06-08 ENCOUNTER — Other Ambulatory Visit: Payer: Self-pay | Admitting: Internal Medicine

## 2019-06-08 ENCOUNTER — Telehealth: Payer: Self-pay

## 2019-06-08 MED ORDER — REPATHA PUSHTRONEX SYSTEM 420 MG/3.5ML ~~LOC~~ SOCT: 420.0000 mg | SUBCUTANEOUS | 11 refills | Status: DC

## 2019-06-08 NOTE — Telephone Encounter (Signed)
Called and got the pt approved for the [employer]. The pt voiced understanding and rx sent

## 2019-06-13 ENCOUNTER — Encounter: Payer: Self-pay | Admitting: Internal Medicine

## 2019-07-16 ENCOUNTER — Ambulatory Visit: Payer: Medicare HMO | Attending: Internal Medicine

## 2019-07-16 ENCOUNTER — Ambulatory Visit: Payer: Medicare HMO

## 2019-07-16 DIAGNOSIS — Z23 Encounter for immunization: Secondary | ICD-10-CM | POA: Insufficient documentation

## 2019-07-16 NOTE — Progress Notes (Signed)
   Covid-19 Vaccination Clinic  Name:  Justin Ayala    MRN: [id number] DOB: 09-18-1950  07/16/2019  Justin Ayala was observed post Covid-19 immunization for 15 minutes without incidence. He was provided with Vaccine Information Sheet and instruction to access the V-Safe system.   Justin Ayala was instructed to call 911 with any severe reactions post vaccine: [name] Difficulty breathing  . Swelling of your face and throat  . A fast heartbeat  . A bad rash all over your body  . Dizziness and weakness    Immunizations Administered    Name Date Dose VIS Date Route   Pfizer COVID-19 Vaccine 07/16/2019 10:42 AM 0.3 mL 05/05/2019 Intramuscular   Manufacturer: [place]   Lot: [id number]   [place]: [id number]

## 2019-08-09 ENCOUNTER — Ambulatory Visit: Payer: Medicare HMO | Attending: Internal Medicine

## 2019-08-09 DIAGNOSIS — Z23 Encounter for immunization: Secondary | ICD-10-CM

## 2019-08-09 NOTE — Progress Notes (Signed)
   Covid-19 Vaccination Clinic  Name:  Mahesh Severs    MRN: [id number] DOB: 1950-12-18  08/09/2019  Mr. Nykaza was observed post Covid-19 immunization for 15 minutes without incident. He was provided with Vaccine Information Sheet and instruction to access the V-Safe system.   Mr. Sunn was instructed to call 911 with any severe reactions post vaccine: [name] Difficulty breathing  . Swelling of face and throat  . A fast heartbeat  . A bad rash all over body  . Dizziness and weakness   Immunizations Administered    Name Date Dose VIS Date Route   Pfizer COVID-19 Vaccine 08/09/2019 12:41 PM 0.3 mL 05/05/2019 Intramuscular   Manufacturer: [place]   Lot: [id number]   [place]: [id number]

## 2019-09-11 DIAGNOSIS — H40023 Open angle with borderline findings, high risk, bilateral: Secondary | ICD-10-CM | POA: Diagnosis not present

## 2019-09-11 DIAGNOSIS — H2513 Age-related nuclear cataract, bilateral: Secondary | ICD-10-CM | POA: Diagnosis not present

## 2019-09-11 DIAGNOSIS — H35033 Hypertensive retinopathy, bilateral: Secondary | ICD-10-CM | POA: Diagnosis not present

## 2019-09-11 DIAGNOSIS — H25013 Cortical age-related cataract, bilateral: Secondary | ICD-10-CM | POA: Diagnosis not present

## 2019-09-15 ENCOUNTER — Other Ambulatory Visit: Payer: Self-pay | Admitting: Internal Medicine

## 2019-09-18 ENCOUNTER — Encounter: Payer: Self-pay | Admitting: Internal Medicine

## 2019-09-18 ENCOUNTER — Ambulatory Visit (INDEPENDENT_AMBULATORY_CARE_PROVIDER_SITE_OTHER): Payer: Medicare HMO | Admitting: Internal Medicine

## 2019-09-18 ENCOUNTER — Other Ambulatory Visit: Payer: Self-pay

## 2019-09-18 VITALS — BP 142/80 | HR 65 | Temp 97.7°F | Ht 67.0 in | Wt 211.5 lb

## 2019-09-18 DIAGNOSIS — M545 Low back pain, unspecified: Secondary | ICD-10-CM

## 2019-09-18 DIAGNOSIS — I10 Essential (primary) hypertension: Secondary | ICD-10-CM | POA: Diagnosis not present

## 2019-09-18 DIAGNOSIS — M549 Dorsalgia, unspecified: Secondary | ICD-10-CM | POA: Insufficient documentation

## 2019-09-18 DIAGNOSIS — Z8546 Personal history of malignant neoplasm of prostate: Secondary | ICD-10-CM

## 2019-09-18 LAB — CBC
HCT: 42.8 % (ref 39.0–52.0)
Hemoglobin: 14 g/dL (ref 13.0–17.0)
MCHC: 32.6 g/dL (ref 30.0–36.0)
MCV: 86 fl (ref 78.0–100.0)
Platelets: 260 10*3/uL (ref 150.0–400.0)
RBC: 4.98 Mil/uL (ref 4.22–5.81)
RDW: 13.4 % (ref 11.5–15.5)
WBC: 5 10*3/uL (ref 4.0–10.5)

## 2019-09-18 LAB — COMPREHENSIVE METABOLIC PANEL
ALT: 25 U/L (ref 0–53)
AST: 25 U/L (ref 0–37)
Albumin: 4.3 g/dL (ref 3.5–5.2)
Alkaline Phosphatase: 89 U/L (ref 39–117)
BUN: 13 mg/dL (ref 6–23)
CO2: 31 mEq/L (ref 19–32)
Calcium: 9.2 mg/dL (ref 8.4–10.5)
Chloride: 103 mEq/L (ref 96–112)
Creatinine, Ser: 1.22 mg/dL (ref 0.40–1.50)
GFR: 70.89 mL/min (ref >60.00)
Glucose, Bld: 106 mg/dL — ABNORMAL HIGH (ref 70–99)
Potassium: 3.7 mEq/L (ref 3.5–5.1)
Sodium: 139 mEq/L (ref 135–145)
Total Bilirubin: 0.4 mg/dL (ref 0.2–1.2)
Total Protein: 7.4 g/dL (ref 6.0–8.3)

## 2019-09-18 LAB — PSA: PSA: 2.65 ng/mL (ref 0.10–4.00)

## 2019-09-18 NOTE — Patient Instructions (Signed)
We will check the labs today. The back is likely more muscular.   Back Exercises These exercises help to make your trunk and back strong. They also help to keep the lower back flexible. Doing these exercises can help to prevent back pain or lessen existing pain.  If you have back pain, try to do these exercises 2-3 times each day or as told by your doctor.  As you get better, do the exercises once each day. Repeat the exercises more often as told by your doctor.  To stop back pain from coming back, do the exercises once each day, or as told by your doctor. Exercises Single knee to chest Do these steps 3-5 times in a row for each leg: 1. Lie on your back on a firm bed or the floor with your legs stretched out. 2. Bring one knee to your chest. 3. Grab your knee or thigh with both hands and hold them it in place. 4. Pull on your knee until you feel a gentle stretch in your lower back or buttocks. 5. Keep doing the stretch for 10-30 seconds. 6. Slowly let go of your leg and straighten it. Pelvic tilt Do these steps 5-10 times in a row: 1. Lie on your back on a firm bed or the floor with your legs stretched out. 2. Bend your knees so they point up to the ceiling. Your feet should be flat on the floor. 3. Tighten your lower belly (abdomen) muscles to press your lower back against the floor. This will make your tailbone point up to the ceiling instead of pointing down to your feet or the floor. 4. Stay in this position for 5-10 seconds while you gently tighten your muscles and breathe evenly. Cat-cow Do these steps until your lower back bends more easily: 1. Get on your hands and knees on a firm surface. Keep your hands under your shoulders, and keep your knees under your hips. You may put padding under your knees. 2. Let your head hang down toward your chest. Tighten (contract) the muscles in your belly. Point your tailbone toward the floor so your lower back becomes rounded like the back of a  cat. 3. Stay in this position for 5 seconds. 4. Slowly lift your head. Let the muscles of your belly relax. Point your tailbone up toward the ceiling so your back forms a sagging arch like the back of a cow. 5. Stay in this position for 5 seconds.  Press-ups Do these steps 5-10 times in a row: 1. Lie on your belly (face-down) on the floor. 2. Place your hands near your head, about shoulder-width apart. 3. While you keep your back relaxed and keep your hips on the floor, slowly straighten your arms to raise the top half of your body and lift your shoulders. Do not use your back muscles. You may change where you place your hands in order to make yourself more comfortable. 4. Stay in this position for 5 seconds. 5. Slowly return to lying flat on the floor.  Bridges Do these steps 10 times in a row: 1. Lie on your back on a firm surface. 2. Bend your knees so they point up to the ceiling. Your feet should be flat on the floor. Your arms should be flat at your sides, next to your body. 3. Tighten your butt muscles and lift your butt off the floor until your waist is almost as high as your knees. If you do not feel the muscles working  in your butt and the back of your thighs, slide your feet 1-2 inches farther away from your butt. 4. Stay in this position for 3-5 seconds. 5. Slowly lower your butt to the floor, and let your butt muscles relax. If this exercise is too easy, try doing it with your arms crossed over your chest. Belly crunches Do these steps 5-10 times in a row: 1. Lie on your back on a firm bed or the floor with your legs stretched out. 2. Bend your knees so they point up to the ceiling. Your feet should be flat on the floor. 3. Cross your arms over your chest. 4. Tip your chin a little bit toward your chest but do not bend your neck. 5. Tighten your belly muscles and slowly raise your chest just enough to lift your shoulder blades a tiny bit off of the floor. Avoid raising your  body higher than that, because it can put too much stress on your low back. 6. Slowly lower your chest and your head to the floor. Back lifts Do these steps 5-10 times in a row: 1. Lie on your belly (face-down) with your arms at your sides, and rest your forehead on the floor. 2. Tighten the muscles in your legs and your butt. 3. Slowly lift your chest off of the floor while you keep your hips on the floor. Keep the back of your head in line with the curve in your back. Look at the floor while you do this. 4. Stay in this position for 3-5 seconds. 5. Slowly lower your chest and your face to the floor. Contact a doctor if:  Your back pain gets a lot worse when you do an exercise.  Your back pain does not get better 2 hours after you exercise. If you have any of these problems, stop doing the exercises. Do not do them again unless your doctor says it is okay. Get help right away if:  You have sudden, very bad back pain. If this happens, stop doing the exercises. Do not do them again unless your doctor says it is okay. This information is not intended to replace advice given to you by your health care provider. Make sure you discuss any questions you have with your health care provider. Document Revised: 02/03/2018 Document Reviewed: 02/03/2018 Elsevier Patient Education  2020 [employer].

## 2019-09-18 NOTE — Assessment & Plan Note (Signed)
Checking PSA, reviewed PET scan from last June today with patient without evidence of recurrence. Continue flomax.

## 2019-09-18 NOTE — Assessment & Plan Note (Signed)
No indication for imaging today. Given exercises and he will let us know if no improvement.

## 2019-09-18 NOTE — Assessment & Plan Note (Signed)
BP on recheck close to normal. Checking CMP and adjust hctz and lisinopril as needed.

## 2019-09-18 NOTE — Progress Notes (Signed)
   Subjective:   Patient ID: Justin Ayala, male    DOB: 04-27-1951, 69 y.o.   MRN: [id number]  HPI The patient is a 69 YO man coming in for follow up history prostate cancer (gets PSA monitoring and last was slightly elevated, needs follow up, past radioactive seed 2012, denies changes, PET scan last June without evidence of recurrence) and blood pressure (took medications late today, typically normal at home, denies chest pains or headaches, taking hctz and lisinopril) and low back pain (hurts with doing yard work or golfing, is changing his swing slightly, denies pain radiating down legs, right sided off center, goes away on its own, not taking anything for it currently, going on for weeks now).   Review of Systems  Constitutional: Negative.   HENT: Negative.   Eyes: Negative.   Respiratory: Negative for cough, chest tightness and shortness of breath.   Cardiovascular: Negative for chest pain, palpitations and leg swelling.  Gastrointestinal: Negative for abdominal distention, abdominal pain, constipation, diarrhea, nausea and vomiting.  Musculoskeletal: Positive for back pain and myalgias.  Skin: Negative.   Neurological: Negative.   Psychiatric/Behavioral: Negative.     Objective:  Physical Exam Constitutional:      Appearance: He is well-developed.  HENT:     Head: Normocephalic and atraumatic.  Cardiovascular:     Rate and Rhythm: Normal rate and regular rhythm.  Pulmonary:     Effort: Pulmonary effort is normal. No respiratory distress.     Breath sounds: Normal breath sounds. No wheezing or rales.  Abdominal:     General: Bowel sounds are normal. There is no distension.     Palpations: Abdomen is soft.     Tenderness: There is no abdominal tenderness. There is no rebound.  Musculoskeletal:        General: Tenderness present.     Cervical back: Normal range of motion.     Comments: Right paraspinal tenderness  Skin:    General: Skin is warm and dry.  Neurological:   Mental Status: He is alert and oriented to person, place, and time.     Coordination: Coordination normal.     Vitals:   09/18/19 1252 09/18/19 1312  BP: (!) 180/110 (!) 142/80  Pulse: 65   Temp: 97.7 F (36.5 C)   TempSrc: Oral   SpO2: 97%   Weight: 211 lb 8 oz (95.9 kg)   Height: 5\' 7"  (1.702 m)     This visit occurred during the SARS-CoV-2 public health emergency.  Safety protocols were in place, including screening questions prior to the visit, additional usage of staff PPE, and extensive cleaning of exam room while observing appropriate contact time as indicated for disinfecting solutions.   Assessment & Plan:

## 2019-10-20 ENCOUNTER — Other Ambulatory Visit: Payer: Self-pay | Admitting: Internal Medicine

## 2019-10-26 ENCOUNTER — Telehealth: Payer: Self-pay | Admitting: Internal Medicine

## 2019-10-26 NOTE — Progress Notes (Signed)
°  Chronic Care Management   Outreach Note  10/26/2019 Name: Haaken Wickland MRN: [id number] DOB: 1950-10-17  Referred by: Hoyt Koch, MD Reason for referral : No chief complaint on file.   An unsuccessful telephone outreach was attempted today. The patient was referred to the pharmacist for assistance with care management and care coordination.   This note is not being shared with the patient for the following reason: To respect privacy (The patient or proxy has requested that the information not be shared).  Follow Up Plan:   Earney Hamburg Upstream Scheduler

## 2019-11-06 ENCOUNTER — Telehealth: Payer: Self-pay | Admitting: Internal Medicine

## 2019-11-06 NOTE — Progress Notes (Signed)
  Chronic Care Management   Outreach Note  11/06/2019 Name: Justin Ayala MRN: [id number] DOB: 12-14-1950  Referred by: Hoyt Koch, MD Reason for referral : No chief complaint on file.   An unsuccessful telephone outreach was attempted today. The patient was referred to the pharmacist for assistance with care management and care coordination. This note is not being shared with the patient for the following reason: To respect privacy (The patient or proxy has requested that the information not be shared).  Follow Up Plan:   Earney Hamburg Upstream Scheduler

## 2019-11-22 ENCOUNTER — Telehealth: Payer: Self-pay | Admitting: Internal Medicine

## 2019-11-22 NOTE — Progress Notes (Signed)
°  Chronic Care Management   Note  11/22/2019 Name: Justin Ayala MRN: [id number] DOB: 17-Mar-1951  Justin Ayala is a 69 y.o. year old male who is a primary care patient of Hoyt Koch, MD. I reached out to Mikey Kirschner by phone today in response to a referral sent by Mr. Justin Ayala's PCP, Hoyt Koch, MD.   Justin Ayala was given information about Chronic Care Management services today including:  1. CCM service includes personalized support from designated clinical staff supervised by his physician, including individualized plan of care and coordination with other care providers 2. 24/7 contact phone numbers for assistance for urgent and routine care needs. 3. Service will only be billed when office clinical staff spend 20 minutes or more in a month to coordinate care. 4. Only one practitioner may furnish and bill the service in a calendar month. 5. The patient may stop CCM services at any time (effective at the end of the month) by phone call to the office staff.   Patient agreed to services and verbal consent obtained.  This note is not being shared with the patient for the following reason: To respect privacy (The patient or proxy has requested that the information not be shared).  Follow up plan:   Earney Hamburg Upstream Scheduler

## 2019-12-11 ENCOUNTER — Other Ambulatory Visit: Payer: Self-pay | Admitting: Cardiovascular Disease

## 2019-12-13 ENCOUNTER — Other Ambulatory Visit: Payer: Self-pay | Admitting: Cardiovascular Disease

## 2019-12-15 ENCOUNTER — Other Ambulatory Visit: Payer: Self-pay | Admitting: Internal Medicine

## 2019-12-26 ENCOUNTER — Other Ambulatory Visit: Payer: Self-pay | Admitting: Internal Medicine

## 2020-01-02 ENCOUNTER — Other Ambulatory Visit: Payer: Self-pay | Admitting: Internal Medicine

## 2020-01-05 ENCOUNTER — Other Ambulatory Visit: Payer: Self-pay | Admitting: Internal Medicine

## 2020-01-16 ENCOUNTER — Other Ambulatory Visit: Payer: Self-pay

## 2020-01-16 ENCOUNTER — Encounter: Payer: Self-pay | Admitting: Family Medicine

## 2020-01-16 ENCOUNTER — Ambulatory Visit: Payer: Medicare HMO | Admitting: Family Medicine

## 2020-01-16 ENCOUNTER — Ambulatory Visit (INDEPENDENT_AMBULATORY_CARE_PROVIDER_SITE_OTHER): Payer: Medicare HMO

## 2020-01-16 VITALS — BP 140/90 | HR 59 | Ht 67.0 in | Wt 207.8 lb

## 2020-01-16 DIAGNOSIS — S39012A Strain of muscle, fascia and tendon of lower back, initial encounter: Secondary | ICD-10-CM

## 2020-01-16 DIAGNOSIS — Z8546 Personal history of malignant neoplasm of prostate: Secondary | ICD-10-CM

## 2020-01-16 DIAGNOSIS — M545 Low back pain: Secondary | ICD-10-CM | POA: Diagnosis not present

## 2020-01-16 NOTE — Patient Instructions (Signed)
Thank you for coming in today. Plan for PT.  Use heating pad.  Get xray today.   If we get your back stronger this will get better.   Recheck with me in about 6 weeks especially if not better.   Let me know if this is not working or you have problem.

## 2020-01-16 NOTE — Progress Notes (Signed)
    Subjective:    CC: Low Back pain  I, Molly Weber, LAT, ATC, am serving as scribe for Dr. Lynne Leader.  HPI: Pt is a 69 y/o male presenting w/ c/o low back pain x one week after bending over to pick something up off the floor.  He states that his back has been bothering him for a few months.  He states that his back does not bother him w/ golf.  Radiating pain: No LE numbness/tingling: No LE weakness: No Aggravating factors: trying to put on his socks; prolonged standing Treatments tried: Tylenol, Aleve, ice, heat  Pertinent review of Systems: No fevers or chills  Relevant historical information: History prostate cancer with chemical recurrence.  PET scan last year was negative.   Objective:    Vitals:   01/16/20 1241  BP: 140/90  Pulse: (!) 59  SpO2: 98%   General: Well Developed, well nourished, and in no acute distress.   MSK: L-spine normal-appearing Nontender midline. Tender palpation right paraspinal musculature. Normal motion some pain with flexion. Lower extremity strength reflexes and sensation are equal normal throughout bilateral lower extremities.  Lab and Radiology Results  X-ray images L-spine obtained today personally and independently reviewed Osteophytes and degenerative disc disease seen throughout lumbar spine worse at P9-J0 Multiple metallic objects in pelvis likely radioactive seeds or surgical staples. No concerning appearing bone lesions. Await formal radiology review  Impression and Recommendations:    Assessment and Plan: 69 y.o. male with low back pain chronic with acute exacerbation right side.  Likely predominantly due to lumbosacral strain and spasm.  Patient does have degenerative disease in the spine which also is contributing to his chronic back pain.  Plan for physical therapy as this will likely improve his acute exacerbation pain..  Recommend heating pad.  X-ray today to help evaluate pain but also to make sure there is no  obvious metastatic disease to his spine or visible pelvis on x-ray.  PDMP not reviewed this encounter. Orders Placed This Encounter  Procedures  . DG Lumbar Spine 2-3 Views    Standing Status:   Future    Number of Occurrences:   1    Standing Expiration Date:   01/15/2021    Order Specific Question:   Reason for Exam (SYMPTOM  OR DIAGNOSIS REQUIRED)    Answer:   eval right lumbar pain    Order Specific Question:   Preferred imaging location?    Answer:   [facility]    Order Specific Question:   Radiology Contrast Protocol - do NOT remove file path    Answer:   [email or web address]  . Ambulatory referral to Physical Therapy    Referral Priority:   Routine    Referral Type:   Physical Medicine    Referral Reason:   Specialty Services Required    Requested Specialty:   Physical Therapy   No orders of the defined types were placed in this encounter.   Discussed warning signs or symptoms. Please see discharge instructions. Patient expresses understanding.   The above documentation has been reviewed and is accurate and complete Lynne Leader, M.D.

## 2020-01-17 NOTE — Progress Notes (Signed)
X-ray lumbar spine shows arthritis present in the low back.  No tumors or metastatic disease to the bone seen on x-ray.

## 2020-01-18 ENCOUNTER — Ambulatory Visit: Payer: Medicare HMO | Admitting: Physical Therapy

## 2020-01-18 ENCOUNTER — Other Ambulatory Visit: Payer: Self-pay

## 2020-01-18 ENCOUNTER — Encounter: Payer: Self-pay | Admitting: Physical Therapy

## 2020-01-18 DIAGNOSIS — M545 Low back pain, unspecified: Secondary | ICD-10-CM

## 2020-01-18 DIAGNOSIS — R252 Cramp and spasm: Secondary | ICD-10-CM | POA: Diagnosis not present

## 2020-01-18 NOTE — Patient Instructions (Signed)
Access Code: [email or web address] URL: [email or web address] Date: 01/18/2020 Prepared by: Faustino Congress  Exercises Standing Quadratus Lumborum Stretch with Doorway - 2 x daily - 7 x weekly - 3 reps - 1 sets - 30 sec hold Standing Quadratus Lumborum Mobilization with Small Ball on Wall - 2 x daily - 7 x weekly - 1 sets - 1 reps - 2-3 min hold  Patient Education Trigger Point Dry Needling

## 2020-01-18 NOTE — Therapy (Signed)
[facility] Physical Therapy [address] [place], [place], [address] Phone: [phone]   Fax:  [phone]  Physical Therapy Evaluation  Patient Details  Name: Justin Ayala MRN: [id number] Date of Birth: 15-Jan-1951 Referring Provider (PT): Gregor Hams, MD   Encounter Date: 01/18/2020   PT End of Session - 01/18/20 1255    Visit Number 1    Number of Visits 12    Date for PT Re-Evaluation 02/29/20    Authorization Type [employer]    Progress Note Due on Visit 10    PT Start Time 0801    PT Stop Time 0840    PT Time Calculation (min) 39 min    Activity Tolerance Patient tolerated treatment well    Behavior During Therapy [facility] for tasks assessed/performed           Past Medical History:  Diagnosis Date  . Allergic rhinitis   . Anemia 2010  . CAD (coronary artery disease)    60-70% prox 1st diagonal branch stenosis, 50% dominant circumflex stenosis in PDA, normal LV function (by 10/12/2008 cath)  . GERD (gastroesophageal reflux disease)   . History of cardiovascular stress test 10/02/2008   exercise tolerance test - abnormal test - subsequent cath   . HLD (hyperlipidemia)   . HTN (hypertension)   . Kidney stones   . Overweight(278.02)   . Peptic ulcer   . Prostate cancer [facility])     Past Surgical History:  Procedure Laterality Date  . APPENDECTOMY  12/2008   Dr. Clyda Greener  . BIOPSY PROSTATE  03/19/11   gleason 3+3=6, volume 34 cc, psa 01/06/11 5.00  . CARDIAC CATHETERIZATION  2010   non-critical stenosis  . FLEXIBLE SIGMOIDOSCOPY N/A 08/18/2013   Procedure: FLEXIBLE SIGMOIDOSCOPY;  Surgeon: Beryle Beams, MD;  Location: [facility]COPY;  Service: Endoscopy;  Laterality: N/A;  . HOT HEMOSTASIS N/A 08/18/2013   Procedure: HOT HEMOSTASIS (ARGON PLASMA COAGULATION/BICAP);  Surgeon: Beryle Beams, MD;  Location: [facility];  Service: Endoscopy;  Laterality: N/A;  AVM - sig colon  . RADIOACTIVE SEED IMPLANT  08/14/2011   Procedure: RADIOACTIVE SEED IMPLANT;   Surgeon: Claybon Jabs, MD;  Location[facility];  Service: Urology;  Laterality: N/A;  69  SEEDS IMPLANTED IN PROSTATE  . TONSILLECTOMY      There were no vitals filed for this visit.    Subjective Assessment - 01/18/20 0806    Subjective Pt is a 69 y/o male who presents to OPPT with acute exacerbation of chronic LBP.  Pt reports about 2 weeks ago he was bending over and had sudden stabbing pain in low back.  He's noticed difficulty with forward bending especially with lower body dressing (worse with twisting motion to Lt shoe).    Pertinent History CAD, HTN, Prostate Cancer    Limitations Standing    Patient Stated Goals improve pain, be able to put socks on without pain    Currently in Pain? Yes    Pain Score 0-No pain   up to 8/10   Pain Location Back    Pain Orientation Right    Pain Descriptors / Indicators Spasm;Stabbing    Pain Type Acute pain;Chronic pain    Pain Onset 1 to 4 weeks ago    Pain Frequency Intermittent    Aggravating Factors  forward bending, standing to wash dishes    Pain Relieving Factors heat              [facility] PT Assessment -  01/18/20 0810      Assessment   Medical Diagnosis S39.012A (ICD-10-CM) - Lumbosacral strain, initial encounter    Referring Provider (PT) Gregor Hams, MD    Onset Date/Surgical Date 01/06/20    Hand Dominance Right    Next MD Visit 02/27/20    Prior Therapy in the past - but can't remember why      Precautions   Precautions None      Restrictions   Weight Bearing Restrictions No      Balance Screen   Has the patient fallen in the past 6 months No    Has the patient had a decrease in activity level because of a fear of falling?  No    Is the patient reluctant to leave their home because of a fear of falling?  No      Home Ecologist residence    Living Arrangements Spouse/significant other    Type of [place]    Additional Comments occasionally has difficulty with  stairs - step to pattern when he has pain      Prior Function   Level of Independence Independent    Vocation Retired    Biomedical scientist retired from [employer], travel, gym (hasn't been consistently since [employer])      Cognition   Overall Cognitive Status Within Functional Limits for tasks assessed      Posture/Postural Control   Posture/Postural Control Postural limitations      ROM / Strength   AROM / PROM / Strength AROM;Strength      AROM   AROM Assessment Site Lumbar    Lumbar Flexion limited 50% with pulling    Lumbar Extension WNL    Lumbar - Right Side Bend limited 25%    Lumbar - Left Side Bend limited 25%    Lumbar - Right Rotation WNL    Lumbar - Left Rotation WNL      Strength   Overall Strength Within functional limits for tasks performed    Strength Assessment Site --    Right/Left Hip --      Flexibility   Soft Tissue Assessment /Muscle Length yes    Quadratus Lumborum tightness on Rt; active trigger point in Rt QL      Special Tests    Special Tests Lumbar    Lumbar Tests Slump Test      Slump test   Findings Negative                      Objective measurements completed on examination: See above findings.       [place] Adult PT Treatment/Exercise - 01/18/20 0810      Exercises   Exercises Lumbar      Lumbar Exercises: Stretches   Other Lumbar Stretch Exercise Rt QL stretch 2x30sec; instructed in use of tennis ball for STM      Manual Therapy   Manual Therapy Soft tissue mobilization    Manual therapy comments skilled palpation and monitoring of soft tissue during DN    Soft tissue mobilization Rt QL and lumbar paraspinals            Trigger Point Dry Needling - 01/18/20 1253    Consent Given? Yes    Muscles Treated Back/Hip Quadratus lumborum    Quadratus Lumborum Response Twitch response elicited                PT Education -  01/18/20 1254    Education Details HEP    Person(s) Educated  Patient    Methods Explanation;Demonstration;Handout    Comprehension Verbalized understanding;Returned demonstration            PT Short Term Goals - 01/18/20 1301      PT SHORT TERM GOAL #1   Title independent with initial HEP    Status New    Target Date 02/08/20             PT Long Term Goals - 01/18/20 1301      PT LONG TERM GOAL #1   Title independent with final HEP    Status New    Target Date 02/29/20      PT LONG TERM GOAL #2   Title perform lumbar ROM without increase in pain    Status New    Target Date 02/29/20      PT LONG TERM GOAL #3   Title report pain < 3/10 with donning/doffing socks for improved function and mobility    Status New    Target Date 02/29/20                  Plan - 01/18/20 1255    Clinical Impression Statement Pt is a 69 y/o male who presents to OPPT for acute exacerbation of chronic LBP.  Symptoms consistent with Rt QL strain, and noted decreased symptoms following DN and manual therapy today.  Pt will benefit from PT to address deficits listed.    Personal Factors and Comorbidities Comorbidity 3+    Comorbidities CAD, HTN, Prostate Cancer    Examination-Activity Limitations Lift;Stand;Locomotion Level;Squat;Stairs;Transfers    Examination-Participation Restrictions Yard Work;Community Activity    Stability/Clinical Decision Making Stable/Uncomplicated    Clinical Decision Making Low    Rehab Potential Good    PT Frequency 2x / week   1-2x/wk   PT Duration 6 weeks    PT Treatment/Interventions ADLs/Self Care Home Management;Cryotherapy;Electrical Stimulation;Ultrasound;Traction;Moist Heat;Gait training;Stair training;Functional mobility training;Therapeutic activities;Therapeutic exercise;Neuromuscular re-education;Patient/family education;Manual techniques;Dry needling;Passive range of motion;Taping    PT Next Visit Plan review stretch, assess response to DN, continue PRN, core/back strengthening    PT Home Exercise Plan  Access Code: [email or web address]    Consulted and Agree with Plan of Care Patient           Patient will benefit from skilled therapeutic intervention in order to improve the following deficits and impairments:  Increased fascial restricitons, Increased muscle spasms, Pain, Decreased range of motion, Impaired flexibility  Visit Diagnosis: Acute right-sided low back pain without sciatica - Plan: PT plan of care cert/re-cert  Cramp and spasm - Plan: PT plan of care cert/re-cert     Problem List Patient Active Problem List   Diagnosis Date Noted  . Back pain 09/18/2019  . Routine general medical examination at a health care facility 10/24/2018  . GERD (gastroesophageal reflux disease) 09/03/2017  . Inguinal hernia 10/18/2015  . Fatigue 10/10/2013  . Erectile dysfunction 12/20/2012  . H/O prostate cancer 04/27/2011    Class: Stage 1  . CAD (coronary artery disease)   . Kidney stones   . Adrenal cyst ([place]) 02/04/2011  . Iron deficiency anemia 03/18/2009  . Overweight 02/03/2008  . Hyperlipidemia 07/22/2006  . HYPERTENSION, BENIGN SYSTEMIC 07/22/2006      Laureen Abrahams, PT, DPT 01/18/20 1:15 PM     [facility] Physical Therapy [address] [place], [place], [address] Phone: [phone]   Fax:  [phone]  Name: Justin Ayala MRN:  [id number] Date of Birth: 1951/04/10

## 2020-01-22 ENCOUNTER — Ambulatory Visit: Payer: Medicare HMO | Admitting: Physical Therapy

## 2020-01-22 ENCOUNTER — Other Ambulatory Visit: Payer: Self-pay

## 2020-01-22 DIAGNOSIS — R252 Cramp and spasm: Secondary | ICD-10-CM | POA: Diagnosis not present

## 2020-01-22 DIAGNOSIS — M545 Low back pain, unspecified: Secondary | ICD-10-CM

## 2020-01-22 NOTE — Patient Instructions (Signed)
Access Code: [email or web address] URL: [email or web address] Date: 01/22/2020 Prepared by: Faustino Congress  Exercises Standing Quadratus Lumborum Stretch with Doorway - 2 x daily - 7 x weekly - 3 reps - 1 sets - 30 sec hold Standing Quadratus Lumborum Mobilization with Small Ball on Wall - 2 x daily - 7 x weekly - 1 sets - 1 reps - 2-3 min hold Prone Hip Extension - 2-3 x daily - 7 x weekly - 1 sets - 10 reps - 5 sec hold Sidelying Hip Extension in Abduction - 2-3 x daily - 7 x weekly - 1 sets - 10 reps - 1-2 sec hold Hooklying Isometric Clamshell - 2-3 x daily - 7 x weekly - 1 sets - 10 reps - 5 sec hold Supine Bridge with Resistance Band - 2-3 x daily - 7 x weekly - 1 sets - 10 reps - 5 seconds hold  Patient Education Trigger Point Dry Needling

## 2020-01-22 NOTE — Therapy (Signed)
[facility] [address] [place], [place], [address] Phone: [phone]   Fax:  [phone]  Physical Therapy Treatment  Patient Details  Name: Justin Ayala MRN: [id number] Date of Birth: Aug 18, 1950 Referring Provider (PT): Gregor Hams, MD   Encounter Date: 01/22/2020   PT End of Session - 01/22/20 1425    Visit Number 2    Number of Visits 12    Date for PT Re-Evaluation 02/29/20    Authorization Type [employer]    Progress Note Due on Visit 10    PT Start Time 1346    PT Stop Time 1420    PT Time Calculation (min) 34 min    Activity Tolerance Patient tolerated treatment well    Behavior During Therapy [facility] for tasks assessed/performed           Past Medical History:  Diagnosis Date  . Allergic rhinitis   . Anemia 2010  . CAD (coronary artery disease)    60-70% prox 1st diagonal branch stenosis, 50% dominant circumflex stenosis in PDA, normal LV function (by 10/12/2008 cath)  . GERD (gastroesophageal reflux disease)   . History of cardiovascular stress test 10/02/2008   exercise tolerance test - abnormal test - subsequent cath   . HLD (hyperlipidemia)   . HTN (hypertension)   . Kidney stones   . Overweight(278.02)   . Peptic ulcer   . Prostate cancer [facility])     Past Surgical History:  Procedure Laterality Date  . APPENDECTOMY  12/2008   Dr. Clyda Greener  . BIOPSY PROSTATE  03/19/11   gleason 3+3=6, volume 34 cc, psa 01/06/11 5.00  . CARDIAC CATHETERIZATION  2010   non-critical stenosis  . FLEXIBLE SIGMOIDOSCOPY N/A 08/18/2013   Procedure: FLEXIBLE SIGMOIDOSCOPY;  Surgeon: Beryle Beams, MD;  Location: [facility];  Service: Endoscopy;  Laterality: N/A;  . HOT HEMOSTASIS N/A 08/18/2013   Procedure: HOT HEMOSTASIS (ARGON PLASMA COAGULATION/BICAP);  Surgeon: Beryle Beams, MD;  Location: [facility];  Service: Endoscopy;  Laterality: N/A;  AVM - sig colon  . RADIOACTIVE SEED IMPLANT  08/14/2011   Procedure: RADIOACTIVE SEED IMPLANT;   Surgeon: Claybon Jabs, MD;  Location: [facility];  Service: Urology;  Laterality: N/A;  80  SEEDS IMPLANTED IN PROSTATE  . TONSILLECTOMY      There were no vitals filed for this visit.    Subjective Assessment - 01/22/20 1348    Subjective no more pain in QL area, seems to have migrated to Rt SIJ.    Pertinent History CAD, HTN, Prostate Cancer    Limitations Standing    Patient Stated Goals improve pain, be able to put socks on without pain    Currently in Pain? No/denies    Pain Onset 1 to 4 weeks ago                          Objective measurements completed on examination: See above findings.       [place] Adult PT Treatment/Exercise - 01/22/20 1349      Lumbar Exercises: Aerobic   Recumbent Bike L3 x 6 min      Lumbar Exercises: Supine   Bridge 10 reps;5 seconds    Bridge Limitations with isometric hip abduction    Other Supine Lumbar Exercises isometric hip abduction in hooklying x 10 reps; 5 sec hol      Lumbar Exercises: Sidelying   Hip Abduction Right;10 reps   with extension  Lumbar Exercises: Prone   Straight Leg Raise 10 reps;5 seconds   Rt side only     Manual Therapy   Manual Therapy Soft tissue mobilization    Soft tissue mobilization IASTM to Rt glutes and QL, lumbar paraspinals                    PT Short Term Goals - 01/18/20 1301      PT SHORT TERM GOAL #1   Title independent with initial HEP    Status New    Target Date 02/08/20             PT Long Term Goals - 01/18/20 1301      PT LONG TERM GOAL #1   Title independent with final HEP    Status New    Target Date 02/29/20      PT LONG TERM GOAL #2   Title perform lumbar ROM without increase in pain    Status New    Target Date 02/29/20      PT LONG TERM GOAL #3   Title report pain < 3/10 with donning/doffing socks for improved function and mobility    Status New    Target Date 02/29/20                  Plan - 01/22/20 1426     Clinical Impression Statement Pt demonstrated no pain in Rt QL at this time, and feel most of his symptoms now related to Rt SIJ dysfunction.  Added SI stabilization exercises to HEP today and encouraged him to work on over next week.  Will continue to benefit from PT to maximize function.    Personal Factors and Comorbidities Comorbidity 3+    Comorbidities CAD, HTN, Prostate Cancer    Examination-Activity Limitations Lift;Stand;Locomotion Level;Squat;Stairs;Transfers    Examination-Participation Restrictions Yard Work;Community Activity    Stability/Clinical Decision Making Stable/Uncomplicated    Rehab Potential Good    PT Frequency 2x / week   1-2x/wk   PT Duration 6 weeks    PT Treatment/Interventions ADLs/Self Care Home Management;Cryotherapy;Electrical Stimulation;Ultrasound;Traction;Moist Heat;Gait training;Stair training;Functional mobility training;Therapeutic activities;Therapeutic exercise;Neuromuscular re-education;Patient/family education;Manual techniques;Dry needling;Passive range of motion;Taping    PT Next Visit Plan see how updated HEP is going, how is pain?, core/back strengthening    PT Home Exercise Plan Access Code: [email or web address]    Consulted and Agree with Plan of Care Patient           Patient will benefit from skilled therapeutic intervention in order to improve the following deficits and impairments:  Increased fascial restricitons, Increased muscle spasms, Pain, Decreased range of motion, Impaired flexibility  Visit Diagnosis: Acute right-sided low back pain without sciatica  Cramp and spasm     Problem List Patient Active Problem List   Diagnosis Date Noted  . Back pain 09/18/2019  . Routine general medical examination at a health care facility 10/24/2018  . GERD (gastroesophageal reflux disease) 09/03/2017  . Inguinal hernia 10/18/2015  . Fatigue 10/10/2013  . Erectile dysfunction 12/20/2012  . H/O prostate cancer 04/27/2011    Class: Stage 1  . CAD  (coronary artery disease)   . Kidney stones   . Adrenal cyst ([place]) 02/04/2011  . Iron deficiency anemia 03/18/2009  . Overweight 02/03/2008  . Hyperlipidemia 07/22/2006  . HYPERTENSION, BENIGN SYSTEMIC 07/22/2006     Laureen Abrahams, PT, DPT 01/22/20 2:28 PM    [facility] [address] [place], [place], [address] Phone: [phone]  Fax:  [phone]  Name: Justin Ayala MRN: [id number] Date of Birth: 1951/04/25

## 2020-02-02 ENCOUNTER — Encounter: Payer: Self-pay | Admitting: Physical Therapy

## 2020-02-02 ENCOUNTER — Ambulatory Visit: Payer: Medicare HMO | Admitting: Physical Therapy

## 2020-02-02 ENCOUNTER — Other Ambulatory Visit: Payer: Self-pay

## 2020-02-02 DIAGNOSIS — M545 Low back pain, unspecified: Secondary | ICD-10-CM

## 2020-02-02 DIAGNOSIS — R252 Cramp and spasm: Secondary | ICD-10-CM

## 2020-02-02 NOTE — Therapy (Addendum)
[facility] [address] [place], [place], [address] Phone: [phone]   Fax:  [phone]  Physical Therapy Treatment Discharge  Patient Details  Name: Justin Ayala MRN: [id number] Date of Birth: 05-11-1951 Referring Provider (PT): Gregor Hams, MD   Encounter Date: 02/02/2020   PT End of Session - 02/02/20 1119    Visit Number 3    Number of Visits 12    Date for PT Re-Evaluation 02/29/20    Authorization Type [employer]    Progress Note Due on Visit 10    PT Start Time 1020    PT Stop Time 1108    PT Time Calculation (min) 48 min    Activity Tolerance Patient tolerated treatment well    Behavior During Therapy [facility] for tasks assessed/performed           Past Medical History:  Diagnosis Date  . Allergic rhinitis   . Anemia 2010  . CAD (coronary artery disease)    60-70% prox 1st diagonal branch stenosis, 50% dominant circumflex stenosis in PDA, normal LV function (by 10/12/2008 cath)  . GERD (gastroesophageal reflux disease)   . History of cardiovascular stress test 10/02/2008   exercise tolerance test - abnormal test - subsequent cath   . HLD (hyperlipidemia)   . HTN (hypertension)   . Kidney stones   . Overweight(278.02)   . Peptic ulcer   . Prostate cancer [facility])     Past Surgical History:  Procedure Laterality Date  . APPENDECTOMY  12/2008   Dr. Clyda Greener  . BIOPSY PROSTATE  03/19/11   gleason 3+3=6, volume 34 cc, psa 01/06/11 5.00  . CARDIAC CATHETERIZATION  2010   non-critical stenosis  . FLEXIBLE SIGMOIDOSCOPY N/A 08/18/2013   Procedure: FLEXIBLE SIGMOIDOSCOPY;  Surgeon: Beryle Beams, MD;  Location: [facility];  Service: Endoscopy;  Laterality: N/A;  . HOT HEMOSTASIS N/A 08/18/2013   Procedure: HOT HEMOSTASIS (ARGON PLASMA COAGULATION/BICAP);  Surgeon: Beryle Beams, MD;  Location: [facility];  Service: Endoscopy;  Laterality: N/A;  AVM - sig colon  . RADIOACTIVE SEED IMPLANT  08/14/2011   Procedure: RADIOACTIVE  SEED IMPLANT;  Surgeon: Claybon Jabs, MD;  Location: [facility];  Service: Urology;  Laterality: N/A;  82  SEEDS IMPLANTED IN PROSTATE  . TONSILLECTOMY      There were no vitals filed for this visit.   Subjective Assessment - 02/02/20 1118    Subjective Pt arriving reporting only soreness of 2/10 on R SIJ.    Pertinent History CAD, HTN, Prostate Cancer    Patient Stated Goals improve pain, be able to put socks on without pain    Currently in Pain? Yes    Pain Score 2     Pain Location Back    Pain Orientation Right    Pain Descriptors / Indicators Sore    Pain Type Chronic pain    Pain Onset 1 to 4 weeks ago                             [facility] Adult PT Treatment/Exercise - 02/02/20 0001      Lumbar Exercises: Stretches   Lower Trunk Rotation 3 reps;20 seconds;Limitations    Piriformis Stretch 3 reps;20 seconds      Lumbar Exercises: Aerobic   Recumbent Bike L3 x 6 minutes      Lumbar Exercises: Supine   Clam 15 reps;5 seconds;Limitations    Clam Limitations green theraband  Bridge 10 reps;5 seconds    Bridge Limitations with isometric hip abduction    Other Supine Lumbar Exercises isometric hip abduction in hooklying x 10 reps; 5 sec hol      Lumbar Exercises: Sidelying   Hip Abduction Right;10 reps   with extension     Lumbar Exercises: Prone   Straight Leg Raise 10 reps;5 seconds   Rt side only     Lumbar Exercises: Quadruped   [place]/Old Horse 10 reps;Limitations    Madcat/Old Horse Limitations holding 10 seconds      Manual Therapy   Manual Therapy Soft tissue mobilization    Manual therapy comments 10 minutes    Soft tissue mobilization IASTM to Rt glutes and QL, lumbar paraspinals                    PT Short Term Goals - 01/18/20 1301      PT SHORT TERM GOAL #1   Title independent with initial HEP    Status New    Target Date 02/08/20             PT Long Term Goals - 02/02/20 1122      PT LONG TERM  GOAL #1   Title independent with final HEP    Status On-going      PT LONG TERM GOAL #2   Title perform lumbar ROM without increase in pain    Status On-going      PT LONG TERM GOAL #3   Title report pain < 3/10 with donning/doffing socks for improved function and mobility    Status On-going                 Plan - 02/02/20 1119    Clinical Impression Statement Pt arriving to therapy reporting more SIJ pain on the R side.Pt reporting inconsistency with his HEP. Pt did report stiffness when playing golf this week.  Pt tolerating treatment well continuing to concentrate on SI stabilization and trunk mobility. Continue skilled PT progressing toward goals set.    Personal Factors and Comorbidities Comorbidity 3+    Comorbidities CAD, HTN, Prostate Cancer    Examination-Activity Limitations Lift;Stand;Locomotion Level;Squat;Stairs;Transfers    Examination-Participation Restrictions Yard Work;Community Activity    Stability/Clinical Decision Making Stable/Uncomplicated    Rehab Potential Good    PT Frequency 2x / week    PT Treatment/Interventions ADLs/Self Care Home Management;Cryotherapy;Electrical Stimulation;Ultrasound;Traction;Moist Heat;Gait training;Stair training;Functional mobility training;Therapeutic activities;Therapeutic exercise;Neuromuscular re-education;Patient/family education;Manual techniques;Dry needling;Passive range of motion;Taping    PT Next Visit Plan core/back strengthening    PT Home Exercise Plan Access Code: [email or web address]    Consulted and Agree with Plan of Care Patient           Patient will benefit from skilled therapeutic intervention in order to improve the following deficits and impairments:  Increased fascial restricitons, Increased muscle spasms, Pain, Decreased range of motion, Impaired flexibility  Visit Diagnosis: Acute right-sided low back pain without sciatica  Cramp and spasm     Problem List Patient Active Problem List   Diagnosis Date  Noted  . Back pain 09/18/2019  . Routine general medical examination at a health care facility 10/24/2018  . GERD (gastroesophageal reflux disease) 09/03/2017  . Inguinal hernia 10/18/2015  . Fatigue 10/10/2013  . Erectile dysfunction 12/20/2012  . H/O prostate cancer 04/27/2011    Class: Stage 1  . CAD (coronary artery disease)   . Kidney stones   . Adrenal cyst ([place]) 02/04/2011  . Iron deficiency  anemia 03/18/2009  . Overweight 02/03/2008  . Hyperlipidemia 07/22/2006  . HYPERTENSION, BENIGN SYSTEMIC 07/22/2006   PHYSICAL THERAPY DISCHARGE SUMMARY Visits from Start of Care: 3 of 12 attended  Current functional level related to goals / functional outcomes: See above   Remaining deficits: See above   Education / Equipment: HEP Plan: Patient agrees to discharge.  Patient goals were not met. Patient is being discharged due to not returning since the last visit.  ?????      Oretha Caprice, PT, MPT 02/02/2020, 11:25 AM  [facility] [address] [place], [place], [address] Phone: [phone]   Fax:  [phone]  Name: Justin Ayala MRN: [id number] Date of Birth: February 15, 1951  Kearney Hard, PT, MPT 03/26/20 4:48 PM

## 2020-02-12 ENCOUNTER — Telehealth: Payer: Self-pay | Admitting: Pharmacist

## 2020-02-12 NOTE — Progress Notes (Signed)
A user error has taken place: encounter opened in error, closed for administrative reasons.

## 2020-02-13 ENCOUNTER — Other Ambulatory Visit: Payer: Self-pay

## 2020-02-13 ENCOUNTER — Ambulatory Visit: Payer: Medicare HMO | Admitting: Pharmacist

## 2020-02-13 DIAGNOSIS — E7800 Pure hypercholesterolemia, unspecified: Secondary | ICD-10-CM

## 2020-02-13 DIAGNOSIS — I2510 Atherosclerotic heart disease of native coronary artery without angina pectoris: Secondary | ICD-10-CM

## 2020-02-13 DIAGNOSIS — K219 Gastro-esophageal reflux disease without esophagitis: Secondary | ICD-10-CM

## 2020-02-13 DIAGNOSIS — I10 Essential (primary) hypertension: Secondary | ICD-10-CM

## 2020-02-13 NOTE — Patient Instructions (Addendum)
Visit Information  Phone number for Pharmacist: [phone]  Thank you for meeting with me to discuss your medications! I look forward to working with you to achieve your health care goals. Below is a summary of what we talked about during the visit:  Goals Addressed            This Visit's Progress   . Pharmacy Care Plan       CARE PLAN ENTRY (see longitudinal plan of care for additional care plan information)  Current Barriers:  . Chronic Disease Management support, education, and care coordination needs related to Hypertension, Hyperlipidemia, Coronary Artery Disease, GERD, and Osteoarthritis   Hypertension BP Readings from Last 3 Encounters:  01/16/20 140/90  09/18/19 (!) 142/80  10/24/18 128/82 .  Pharmacist Clinical Goal(s): o Over the next 30 days, patient will work with PharmD and providers to achieve BP goal <130/80 . Current regimen:  o HCTZ 25 mg daily o Lisinopril 20 mg daily o Metoprolol tartrate 25 mg - 1/2 tab twice a day . Interventions: o Discussed BP goals and benefits of medications for prevention of heart attack / stroke . Patient self care activities - Over the next 30 days, patient will: o Check BP 3-4 times weekly, document, and provide at future appointments o Ensure daily salt intake < 2300 mg/day  Hyperlipidemia / CAD Lab Results  Component Value Date/Time   LDLCALC 77 04/14/2019 10:40 AM   LDLDIRECT 84.3 05/30/2014 11:02 AM .  Pharmacist Clinical Goal(s): o Over the next 30 days, patient will work with PharmD and providers to achieve LDL goal < 70 . Current regimen:  o Repatha Pushtronex 420 mg applied every 30 days o Ezetimibe 10 mg daily o Aspirin 81 mg daily . Interventions: o Discussed cholesterol goals and benefits of medications for prevention of heart attack / stroke . Patient self care activities - Over the next 30 days, patient will: o Continue current medications  GERD . Pharmacist Clinical Goal(s) o Over the next 30 days,  patient will work with PharmD and providers to optimize therapy . Current regimen:  o Pantoprazole 40 mg daily . Interventions: o Discussed mechanism of pantoprazole and optimal timing of medication . Patient self care activities - Over the next 30 days, patient will: o Move pantoprazole to evening when symptoms tend to occur  Osteoarthritis . Pharmacist Clinical Goal(s) o Over the next 30 days, patient will work with PharmD and providers to optimize therapy . Current regimen:  o Meloxicam 7.5 mg daily . Interventions: o Discussed long term risks of meloxicam use including kidney and BP issues o Recommended Tylenol for arthritis . Patient self care activities - Over the next 30 days, patient will: o Use Tylenol for arthritis pain; reserve Meloxicam for more severe pain days  Medication management . Pharmacist Clinical Goal(s): o Over the next 30 days, patient will work with PharmD and providers to achieve optimal medication adherence . Current pharmacy: [facility] . Interventions o Comprehensive medication review performed. o Switch medications to [facility] mail order for $0 generics and OTC products . Patient self care activities - Over the next 30 days, patient will: o Focus on medication adherence by pill box o Take medications as prescribed o Contact [employer] to switch to mail order pharmacy o Report any questions or concerns to PharmD and/or provider(s)  Initial goal documentation      Justin Ayala was given information about Chronic Care Management services today including:  1. CCM service includes personalized support from designated  clinical staff supervised by his physician, including individualized plan of care and coordination with other care providers 2. 24/7 contact phone numbers for assistance for urgent and routine care needs. 3. Standard insurance, coinsurance, copays and deductibles apply for chronic care management only during months in which we provide at least 20  minutes of these services. Most insurances cover these services at 100%, however patients may be responsible for any copay, coinsurance and/or deductible if applicable. This service may help you avoid the need for more expensive face-to-face services. 4. Only one practitioner may furnish and bill the service in a calendar month. 5. The patient may stop CCM services at any time (effective at the end of the month) by phone call to the office staff.  Patient agreed to services and verbal consent obtained.   Patient verbalizes understanding of instructions provided today.  Telephone follow up appointment with pharmacy team member scheduled for: 1 month  Charlene Brooke, PharmD, [facility] Clinical Pharmacist [facility] Gastroesophageal Reflux Disease, Adult When you have gastroesophageal reflux disease (GERD), the foods you eat and your eating habits are very important. Choosing the right foods can help ease the discomfort of GERD. Consider working with a diet and nutrition specialist (dietitian) to help you make healthy food choices. What general guidelines should I follow?  Eating plan  Choose healthy foods low in fat, such as fruits, vegetables, whole grains, low-fat dairy products, and lean meat, fish, and poultry.  Eat frequent, small meals instead of three large meals each day. Eat your meals slowly, in a relaxed setting. Avoid bending over or lying down until 2-3 hours after eating.  Limit high-fat foods such as fatty meats or fried foods.  Limit your intake of oils, butter, and shortening to less than 8 teaspoons each day.  Avoid the following: ? Foods that cause symptoms. These may be different for different people. Keep a food diary to keep track of foods that cause symptoms. ? Alcohol. ? Drinking large amounts of liquid with meals. ? Eating meals during the 2-3 hours before bed.  Cook foods using methods other than frying.  This may include baking, grilling, or broiling. Lifestyle  Maintain a healthy weight. Ask your health care provider what weight is healthy for you. If you need to lose weight, work with your health care provider to do so safely.  Exercise for at least 30 minutes on 5 or more days each week, or as told by your health care provider.  Avoid wearing clothes that fit tightly around your waist and chest.  Do not use any products that contain nicotine or tobacco, such as cigarettes and e-cigarettes. If you need help quitting, ask your health care provider.  Sleep with the head of your bed raised. Use a wedge under the mattress or blocks under the bed frame to raise the head of the bed. What foods are not recommended? The items listed may not be a complete list. Talk with your dietitian about what dietary choices are best for you. Grains Pastries or quick breads with added fat. [place] toast. Vegetables Deep fried vegetables. [place] fries. Any vegetables prepared with added fat. Any vegetables that cause symptoms. For some people this may include tomatoes and tomato products, chili peppers, onions and garlic, and horseradish. Fruits Any fruits prepared with added fat. Any fruits that cause symptoms. For some people this may include citrus fruits, such as oranges, grapefruit, pineapple, and lemons. Meats and other protein  foods High-fat meats, such as fatty beef or pork, hot dogs, ribs, ham, sausage, salami and bacon. Fried meat or protein, including fried fish and fried chicken. Nuts and nut butters. Dairy Whole milk and chocolate milk. Sour cream. Cream. Ice cream. Cream cheese. Milk shakes. Beverages Coffee and tea, with or without caffeine. Carbonated beverages. Sodas. Energy drinks. Fruit juice made with acidic fruits (such as orange or grapefruit). Tomato juice. Alcoholic drinks. Fats and oils Butter. Margarine. Shortening. Ghee. Sweets and desserts Chocolate and cocoa. Donuts. Seasoning  and other foods Pepper. Peppermint and spearmint. Any condiments, herbs, or seasonings that cause symptoms. For some people, this may include curry, hot sauce, or vinegar-based salad dressings. Summary  When you have gastroesophageal reflux disease (GERD), food and lifestyle choices are very important to help ease the discomfort of GERD.  Eat frequent, small meals instead of three large meals each day. Eat your meals slowly, in a relaxed setting. Avoid bending over or lying down until 2-3 hours after eating.  Limit high-fat foods such as fatty meat or fried foods. This information is not intended to replace advice given to you by your health care provider. Make sure you discuss any questions you have with your health care provider. Document Revised: 09/01/2018 Document Reviewed: 05/12/2016 Elsevier Patient Education  [place].

## 2020-02-13 NOTE — Chronic Care Management (AMB) (Signed)
Chronic Care Management Pharmacy  Name: Justin Ayala  MRN: [id number] DOB: 01-Jan-1951   Chief Complaint/ HPI  Justin Ayala,  69 y.o. , male presents for their Initial CCM visit with the clinical pharmacist In office.  PCP : Hoyt Koch, MD Patient Care Team: Hoyt Koch, MD as PCP - General (Internal Medicine) Lorretta Harp, MD as PCP - Cardiology (Cardiology) Charlton Haws, [facility] as Pharmacist (Pharmacist)  Their chronic conditions include: Hypertension, Hyperlipidemia, Coronary Artery Disease and GERD, Hx prostate cancer, Back pain  Office Visits: 09/18/19 Dr Sharlet Salina OV: chronic f/u, labs stable (PSA slightly lower), no med changes. Rec'd exercises for back pain.  Consult Visit: Outpatient PT for back pain [facility]) 01/16/20 Dr Georgina Snell (sports med): back pain - likely 2/2 lumbosacral strain and spasm, degenerative changes. Plan for PT, heating pad.  Allergies  Allergen Reactions  . Ace Inhibitors Cough   Medications: Outpatient Encounter Medications as of 02/13/2020  Medication Sig  . ASPIRIN ADULT LOW STRENGTH 81 MG EC tablet TAKE 1 BY MOUTH DAILY  . Cholecalciferol (VITAMIN D-3) 1000 UNITS CAPS Take by mouth. Take one daily  . Cranberry 125 MG TABS Take 1 tablet by mouth 2 (two) times a week.  . Evolocumab with Infusor ([place]) 420 MG/3.5ML SOCT Inject 420 mg into the skin every 30 (thirty) days.  [name] ezetimibe (ZETIA) 10 MG tablet Take 1 tablet by mouth once daily  . ferrous sulfate 325 (65 FE) MG tablet Take 1 tablet (325 mg total) by mouth daily.  . Glucosamine-Chondroitin (OSTEO BI-FLEX REGULAR STRENGTH PO) Take 1 tablet by mouth daily.  . hydrochlorothiazide (HYDRODIURIL) 25 MG tablet Take 1 tablet by mouth once daily  . [name] 100 MG CAPS Take 1 capsule (100 mg total) by mouth daily.  [name] lisinopril (ZESTRIL) 20 MG tablet Take 1 tablet by mouth once daily  . meloxicam (MOBIC) 7.5 MG tablet Take 1 tablet (7.5 mg  total) by mouth daily.  . metoprolol tartrate (LOPRESSOR) 25 MG tablet Take 1/2 (one-half) tablet by mouth twice daily  . pantoprazole (PROTONIX) 40 MG tablet Take 1 tablet by mouth once daily  . Resveratrol 100 MG CAPS Take 1 capsule by mouth daily.  . tamsulosin (FLOMAX) 0.4 MG CAPS capsule Take 1 capsule by mouth twice daily   No facility-administered encounter medications on file as of 02/13/2020.    Wt Readings from Last 3 Encounters:  01/16/20 207 lb 12.8 oz (94.3 kg)  09/18/19 211 lb 8 oz (95.9 kg)  10/24/18 199 lb (90.3 kg)    Current Diagnosis/Assessment:  SDOH Interventions     Most Recent Value  SDOH Interventions  Financial Strain Interventions Intervention Not Indicated      Goals Addressed            This Visit's Progress   . Pharmacy Care Plan       CARE PLAN ENTRY (see longitudinal plan of care for additional care plan information)  Current Barriers:  . Chronic Disease Management support, education, and care coordination needs related to Hypertension, Hyperlipidemia, Coronary Artery Disease, GERD, and Osteoarthritis   Hypertension BP Readings from Last 3 Encounters:  01/16/20 140/90  09/18/19 (!) 142/80  10/24/18 128/82 .  Pharmacist Clinical Goal(s): o Over the next 30 days, patient will work with PharmD and providers to achieve BP goal <130/80 . Current regimen:  o HCTZ 25 mg daily o Lisinopril 20 mg daily o Metoprolol tartrate 25 mg - 1/2 tab twice a day .  Interventions: o Discussed BP goals and benefits of medications for prevention of heart attack / stroke . Patient self care activities - Over the next 30 days, patient will: o Check BP 3-4 times weekly, document, and provide at future appointments o Ensure daily salt intake < 2300 mg/day  Hyperlipidemia / CAD Lab Results  Component Value Date/Time   LDLCALC 77 04/14/2019 10:40 AM   LDLDIRECT 84.3 05/30/2014 11:02 AM .  Pharmacist Clinical Goal(s): o Over the next 30 days, patient will  work with PharmD and providers to achieve LDL goal < 70 . Current regimen:  o Repatha Pushtronex 420 mg applied every 30 days o Ezetimibe 10 mg daily o Aspirin 81 mg daily . Interventions: o Discussed cholesterol goals and benefits of medications for prevention of heart attack / stroke . Patient self care activities - Over the next 30 days, patient will: o Continue current medications  GERD . Pharmacist Clinical Goal(s) o Over the next 30 days, patient will work with PharmD and providers to optimize therapy . Current regimen:  o Pantoprazole 40 mg daily . Interventions: o Discussed mechanism of pantoprazole and optimal timing of medication . Patient self care activities - Over the next 30 days, patient will: o Move pantoprazole to evening when symptoms tend to occur  Osteoarthritis . Pharmacist Clinical Goal(s) o Over the next 30 days, patient will work with PharmD and providers to optimize therapy . Current regimen:  o Meloxicam 7.5 mg daily . Interventions: o Discussed long term risks of meloxicam use including kidney and BP issues o Recommended Tylenol for arthritis . Patient self care activities - Over the next 30 days, patient will: o Use Tylenol for arthritis pain; reserve Meloxicam for more severe pain days  Medication management . Pharmacist Clinical Goal(s): o Over the next 30 days, patient will work with PharmD and providers to achieve optimal medication adherence . Current pharmacy: [facility] . Interventions o Comprehensive medication review performed. o Switch medications to [facility] mail order for $0 generics and OTC products . Patient self care activities - Over the next 30 days, patient will: o Focus on medication adherence by pill box o Take medications as prescribed o Contact [employer] to switch to mail order pharmacy o Report any questions or concerns to PharmD and/or provider(s)  Initial goal documentation       Hypertension   BP goal is:   <130/80  Office blood pressures are  BP Readings from Last 3 Encounters:  01/16/20 140/90  09/18/19 (!) 142/80  10/24/18 128/82   Pulse Readings from Last 3 Encounters:  01/16/20 (!) 59  09/18/19 65  10/24/18 63   Kidney Function Lab Results  Component Value Date/Time   CREATININE 1.22 09/18/2019 01:17 PM   CREATININE 1.37 10/24/2018 09:57 AM   CREATININE 1.28 01/27/2013 01:49 PM   CREATININE 1.12 12/20/2012 04:00 PM   GFR 70.89 09/18/2019 01:17 PM   GFRNONAA 69 (L) 07/26/2013 02:34 AM   GFRAA 80 (L) 07/26/2013 02:34 AM   K 3.7 09/18/2019 01:17 PM   K 3.7 10/24/2018 09:57 AM   Patient checks BP at home 1-2x per week Patient home BP readings are ranging: 121/79  Patient has failed these meds in the past: n/a Patient is currently controlled on the following medications:  . HCTZ 25 mg daily . Lisinopril 20 mg daily - not on dispense report . Metoprolol tartrate 25 mg - 1/2 tab BID  We discussed diet and exercise extensively; BP goals; benefits of medications; pt denies  side effects or issues  Plan  Continue current medications and control with diet and exercise     Hyperlipidemia   LDL goal < 70 Hx CAD - cath 09/2008 Hx statin intolerance, started Repatha 2019  Lipid Panel     Component Value Date/Time   CHOL 148 04/14/2019 1040   TRIG 101 04/14/2019 1040   HDL 52 04/14/2019 1040   LDLCALC 77 04/14/2019 1040   LDLDIRECT 84.3 05/30/2014 1102    Hepatic Function Latest Ref Rng & Units 09/18/2019 10/24/2018 09/03/2017  Total Protein 6.0 - 8.3 g/dL 7.4 7.4 7.2  Albumin 3.5 - 5.2 g/dL 4.3 4.2 4.2  AST 0 - 37 U/L _0 ALT 0 - 53 U/L _1 Alk Phosphatase 39 - 117 U/L 89 90 77  Total Bilirubin 0.2 - 1.2 mg/dL 0.4 0.5 0.7  Bilirubin, Direct 0.00 - 0.40 mg/dL - - -     The 10-year ASCVD risk score Justin Bussing DC Jr., et al., 2013) is: 20.8%   Values used to calculate the score:     Age: 74 years     Sex: Male     Is Non-Hispanic African American: Yes      Diabetic: No     Tobacco smoker: No     Systolic Blood Pressure: 882 mmHg     Is BP treated: Yes     HDL Cholesterol: 52 mg/dL     Total Cholesterol: 148 mg/dL   Patient has failed these meds in past: atorvastatin, ezetimibe,  Patient is currently controlled on the following medications:  . Repatha Pushtronex 420 mg q30 days . Ezetimibe 10 mg daily . Aspirin 81 mg daily  We discussed:  diet and exercise extensively; pt reports LFT elevations and muscle weakness with atorvastatin; he reports he sometimes takes Repatha 1-2 weeks late; discussed Repatha's duration of effect and advised to take med on time.  Plan  Continue current medications and control with diet and exercise  GERD   Patient has failed these meds in past: n/a Patient is currently uncontrolled on the following medications:  . Pantoprazole 40 mg daily  We discussed:  Pt reports reflux upon lying down at night; he takes PPI in the morning and uses Tums at night to relieve symptoms; advised to move PPI to 60 min before dinner to optimize timing; may consider increasing to BID or adding an H2RA in the evening if adjusting timing of PPI does not help.  Plan  Continue current medications  Recommend to move pantoprazole to evening dosing  Pain   Degenerative spinal changes Arthritis  Patient has failed these meds in past: n/a Patient is currently controlled on the following medications:  [name] Meloxicam 7.5 mg daily  We discussed:  Indication for meloxicam - pt was not sure what it was for and endorses taking it for years; pt reports back pain is relatively well controlled currently, he does endorse chronic joint pain; discussed benefits of Tylenol as first line treatment for osteoarthritis; pt has had elevated LFTs on statin in 2016 but these have since normalized, currently no conditions preclude Tylenol use.  Plan  Recommended to use Tylenol prn for arthritis pain Recommended to reserve meloxicam for moderate-severe  pain  BPH / Hx prostate cancer   Lab Results  Component Value Date/Time   PSA 2.65 09/18/2019 01:17 PM   PSA 2.91 10/24/2018 09:57 AM   PSA 1.83 12/22/2011 09:36 AM   Patient has failed these meds in past: n/a  Patient is currently controlled on the following medications:  . Tamsulosin 0.4 mg daily  We discussed: tamsulosin is prescribed BID however pt reports taking it daily for years. PSA is stable and pt denies symptoms  Plan  Continue current medications  Anemia   CBC Latest Ref Rng & Units 09/18/2019 10/24/2018 09/03/2017  WBC 4.0 - 10.5 K/uL 5.0 4.7 4.7  Hemoglobin 13.0 - 17.0 g/dL 14.0 14.3 14.2  Hematocrit 39 - 52 % 42.8 42.7 42.7  Platelets 150 - 400 K/uL 260.0 258.0 266.0   Iron/TIBC/Ferritin/ %Sat    Component Value Date/Time   IRON 78 05/30/2014 1102   TIBC 321 05/30/2014 1102   FERRITIN 29.7 05/30/2014 1102   IRONPCTSAT 24 05/30/2014 1102   Patient has failed these meds in past: n/a Patient is currently controlled on the following medications:  . Ferrous sulfate 325 mg daily  We discussed:  Hemoglobin is in goal range; pt denies fatigue or issues with ferrous sulfate  Plan  Continue current medications  Health Maintenance   Patient is currently controlled on the following medications:  [name] Vitamin D 1000 IU daily . [name] 100 mg daily . Garlic PO . Osteo Bi Flex . Vitamin B12 500 mcg daily . Multivitamin  . Resveratrol . Cranberry twice a week  We discussed:  Patient is satisfied with current OTC regimen and denies issues  Plan  Continue current medications  Medication Management   Pt uses [facility] for all medications Uses pill box? Yes Pt endorses 99% compliance  We discussed: Pt is interested in switching to [facility] mail order; discussed $0 generics through mail order plus OTC quarterly allowance. Pt will contact [employer] to switch  Plan  Switch to [facility] mail order for cost savings    Follow up: 1 month phone  visit  Charlene Brooke, PharmD, [facility] Clinical Pharmacist [facility] [phone]

## 2020-02-14 ENCOUNTER — Other Ambulatory Visit: Payer: Self-pay

## 2020-02-14 DIAGNOSIS — I10 Essential (primary) hypertension: Secondary | ICD-10-CM

## 2020-02-27 ENCOUNTER — Ambulatory Visit (INDEPENDENT_AMBULATORY_CARE_PROVIDER_SITE_OTHER): Payer: Medicare HMO | Admitting: Family Medicine

## 2020-02-27 ENCOUNTER — Other Ambulatory Visit: Payer: Self-pay

## 2020-02-27 ENCOUNTER — Encounter: Payer: Self-pay | Admitting: Family Medicine

## 2020-02-27 VITALS — BP 138/78 | HR 74 | Ht 67.0 in | Wt 211.0 lb

## 2020-02-27 DIAGNOSIS — Z8546 Personal history of malignant neoplasm of prostate: Secondary | ICD-10-CM | POA: Diagnosis not present

## 2020-02-27 DIAGNOSIS — G8929 Other chronic pain: Secondary | ICD-10-CM | POA: Diagnosis not present

## 2020-02-27 DIAGNOSIS — M545 Low back pain, unspecified: Secondary | ICD-10-CM | POA: Diagnosis not present

## 2020-02-27 NOTE — Progress Notes (Signed)
   I, Wendy Poet, LAT, ATC, am serving as scribe for Dr. Lynne Leader.  Justin Ayala is a 69 y.o. male who presents to [facility] today for f/u of chronic LBP.  He was last seen by Dr. Georgina Snell on 01/16/20 and was referred to PT of which he has completed 3 visits.  Since his last visit, pt reports that his low back pain is doing much better and has gotten back to his baseline level of back pain.  He has completed PT and has been discharged.  He notes pain in the right low back especially with golfing.  At rest he does not have much pain but typically experiences pain the day following a golfing outing.  Diagnostic imaging: L-spine XR- 01/16/20   Pertinent review of systems: No fevers or chills  Relevant historical information: Prostate cancer negative PET scan 2020   Exam:  BP 138/78 (BP Location: Right Arm, Patient Position: Sitting, Cuff Size: Large)   Pulse 74   Ht 5\' 7"  (1.702 m)   Wt 211 lb (95.7 kg)   SpO2 96%   BMI 33.05 kg/m  General: Well Developed, well nourished, and in no acute distress.   MSK: L-spine normal-appearing normal motion normal gait.    Lab and Radiology Results DG Lumbar Spine 2-3 Views  Result Date: 01/16/2020 CLINICAL DATA:  69 year old male with low back pain. EXAM: LUMBAR SPINE - 2-3 VIEW COMPARISON:  CT abdomen pelvis dated 09/14/2014. FINDINGS: Five lumbar type vertebra. There is no acute fracture or subluxation of the lumbar spine. There are multilevel degenerative changes with anterior osteophyte. Multilevel facet arthropathy. Prostate brachytherapy seeds noted. The soft tissues are unremarkable. IMPRESSION: 1. No acute/traumatic lumbar spine pathology. 2. Multilevel degenerative changes. Electronically Signed   By: Anner Crete M.D.   On: 01/16/2020 20:05   I, Lynne Leader, personally (independently) visualized and performed the interpretation of the images attached in this note.     Assessment and Plan: 69 y.o.  male with right low back pain chronic.  Very likely due to DJD.  Patient's a acute exacerbation of pain was treated with physical therapy and now he is back to his baseline of pain.  We spent time today discussing treatment options including continued core strengthening on his own with home exercises.  Also recommend considering Pilates and water aerobics and swimming.  Potential next step for intervention would be MRI for facet injection planning.  Additionally discussed the possibility of Cymbalta.  Patient will think about it and let me know what he would like to do next.  He favors conservative management and trying to work on his core strength on his own.    Discussed warning signs or symptoms. Please see discharge instructions. Patient expresses understanding.   The above documentation has been reviewed and is accurate and complete Lynne Leader, M.D.  Total encounter time 20 minutes including face-to-face time with the patient and, reviewing past medical record, and charting on the date of service.   Treatment plan

## 2020-02-27 NOTE — Patient Instructions (Addendum)
Thank you for coming in today.  Work on Location manager. Consider pilaties or even a Physiological scientist.  Consider swimming. If needed next step is MRI for facet injection planning.  Let me know.    Facet Joint Block The facet joints connect the bones of the spine (vertebrae). They make it possible for you to bend, twist, and make other movements with your spine. They also keep you from bending too far, twisting too far, and making other extreme movements. A facet joint block is a procedure in which a numbing medicine (anesthetic) is injected into a facet joint. In many cases, an anti-inflammatory medicine (steroid) is also injected. A facet joint block may be done:  To diagnose neck or back pain. If the pain gets better after a facet joint block, it means the pain is probably coming from the facet joint. If the pain does not get better, it means the pain is probably not coming from the facet joint.  To relieve neck or back pain that is caused by an inflamed facet joint. A facet joint block is only done to relieve pain if the pain does not improve with other methods, such as medicine, exercise programs, and physical therapy. Tell a health care provider about:  Any allergies you have.  All medicines you are taking, including vitamins, herbs, eye drops, creams, and over-the-counter medicines.  Any problems you or family members have had with anesthetic medicines.  Any blood disorders you have.  Any surgeries you have had.  Any medical conditions you have or have had.  Whether you are pregnant or may be pregnant. What are the risks? Generally, this is a safe procedure. However, problems may occur, including:  Bleeding.  Injury to a nerve near the injection site.  Pain at the injection site.  Weakness or numbness in areas controlled by nerves near the injection site.  Infection.  Temporary fluid retention.  Allergic reactions to medicines or dyes.  Injury to other structures  or organs near the injection site. What happens before the procedure? Medicines Ask your health care provider about:  Changing or stopping your regular medicines. This is especially important if you are taking diabetes medicines or blood thinners.  Taking medicines such as aspirin and ibuprofen. These medicines can thin your blood. Do not take these medicines unless your health care provider tells you to take them.  Taking over-the-counter medicines, vitamins, herbs, and supplements. Eating and drinking Follow instructions from your health care provider about eating and drinking, which may include:  8 hours before the procedure - stop eating heavy meals or foods, such as meat, fried foods, or fatty foods.  6 hours before the procedure - stop eating light meals or foods, such as toast or cereal.  6 hours before the procedure - stop drinking milk or drinks that contain milk.  2 hours before the procedure - stop drinking clear liquids. Staying hydrated Follow instructions from your health care provider about hydration, which may include:  Up to 2 hours before the procedure - you may continue to drink clear liquids, such as water, clear fruit juice, black coffee, and plain tea. General instructions  Do not use any products that contain nicotine or tobacco for at least 4-6 weeks before the procedure. These products include cigarettes, e-cigarettes, and chewing tobacco. If you need help quitting, ask your health care provider.  Plan to have someone take you home from the hospital or clinic.  Ask your health care provider: ? How your surgery  site will be marked. ? What steps will be taken to help prevent infection. These may include:  Removing hair at the surgery site.  Washing skin with a germ-killing soap.  Receiving antibiotic medicine. What happens during the procedure?   You will put on a hospital gown.  You will lie on your stomach on an X-ray table. You may be asked to lie  in a different position if an injection will be made in your neck.  Machines will be used to monitor your oxygen levels, heart rate, and blood pressure.  Your skin will be cleaned.  If an injection will be made in your neck, an IV will be inserted into one of your veins. Fluids and medicine will flow directly into your body through the IV.  A numbing medicine (local anesthetic) will be applied to your skin. Your skin may sting or burn for a moment.  A video X-ray machine (fluoroscopy) will be used to find the joint. In some cases, a CT scan may be used.  A contrast dye may be injected into the facet joint area to help find the joint.  When the joint is located, an anesthetic will be injected into the joint through the needle.  Your health care provider will ask you whether you feel pain relief. ? If you feel relief, a steroid may be injected to provide pain relief for a longer period of time. ? If you do not feel relief or feel only partial relief, additional injections of an anesthetic may be made in other facet joints.  The needle will be removed.  Your skin will be cleaned.  A bandage (dressing) will be applied over each injection site. The procedure may vary among health care providers and hospitals. What happens after the procedure?  Your blood pressure, heart rate, breathing rate, and blood oxygen level will be monitored until you leave the hospital or clinic.  You will lie down and rest for a period of time. Summary  A facet joint block is a procedure in which a numbing medicine (anesthetic) is injected into a facet joint. An anti-inflammatory medicine (stereoid) may also be injected.  Follow instructions from your health care provider about medicines and eating and drinking before the procedure.  Do not use any products that contain nicotine or tobacco for at least 4-6 weeks before the procedure.  You will lie on your stomach for the procedure, but you may be asked to  lie in a different position if an injection will be made in your neck.  When the joint is located, an anesthetic will be injected into the joint through the needle. This information is not intended to replace advice given to you by your health care provider. Make sure you discuss any questions you have with your health care provider. Document Revised: 09/01/2018 Document Reviewed: 04/15/2018 Elsevier Patient Education  [employer].

## 2020-03-13 ENCOUNTER — Telehealth: Payer: Medicare HMO

## 2020-03-21 ENCOUNTER — Other Ambulatory Visit: Payer: Self-pay | Admitting: Internal Medicine

## 2020-03-21 ENCOUNTER — Other Ambulatory Visit: Payer: Self-pay | Admitting: Cardiovascular Disease

## 2020-04-01 ENCOUNTER — Other Ambulatory Visit: Payer: Self-pay | Admitting: Cardiovascular Disease

## 2020-04-01 NOTE — Telephone Encounter (Signed)
 *  STAT* If patient is at the pharmacy, call can be transferred to refill team.   1. Which medications need to be refilled? (please list name of each medication and dose if known)   lisinopril (ZESTRIL) 20 MG tablet  2. Which pharmacy/location (including street and city if local pharmacy) is medication to be sent to?  [place], [address]  3. Do they need a 30 day or 90 day supply? 90 day  Patient has appointment scheduled for 06/14/20 which was the earliest available but will need a refill to make it to the appointment

## 2020-04-04 MED ORDER — LISINOPRIL 20 MG PO TABS
20.0000 mg | ORAL_TABLET | Freq: Every day | ORAL | 1 refills | Status: DC
Start: 2020-04-04 — End: 2020-05-08

## 2020-04-10 ENCOUNTER — Telehealth: Payer: Medicare HMO

## 2020-04-10 NOTE — Chronic Care Management (AMB) (Deleted)
Chronic Care Management Pharmacy  Name: Justin Ayala  MRN: [id number] DOB: [birth year]   Chief Complaint/ HPI  Justin Ayala,  69 y.o. , male presents for their Follow-Up CCM visit with the clinical pharmacist via telephone.  PCP : Hoyt Koch, MD Patient Care Team: Hoyt Koch, MD as PCP - General (Internal Medicine) Lorretta Harp, MD as PCP - Cardiology (Cardiology) Charlton Haws, [facility] as Pharmacist (Pharmacist)  Their chronic conditions include: Hypertension, Hyperlipidemia, Coronary Artery Disease and GERD, Hx prostate cancer, Back pain  Office Visits: 09/18/19 Dr Sharlet Salina OV: chronic f/u, labs stable (PSA slightly lower), no med changes. Rec'd exercises for back pain.  Consult Visit: 02/27/20 Dr Georgina Snell (sports med): f/u back pain. Discussed water aerobics/swimming, MRI for facet injection, possibly Duloxetine. Pt will work on core strength, swimming.  Outpatient PT for back pain [facility]) 01/16/20 Dr Georgina Snell (sports med): back pain - likely 2/2 lumbosacral strain and spasm, degenerative changes. Plan for PT, heating pad.  Allergies  Allergen Reactions  . Ace Inhibitors Cough   Medications: Outpatient Encounter Medications as of 04/10/2020  Medication Sig  . ASPIRIN ADULT LOW STRENGTH 81 MG EC tablet TAKE 1 BY MOUTH DAILY  . Cholecalciferol (VITAMIN D-3) 1000 UNITS CAPS Take by mouth. Take one daily  . Cranberry 125 MG TABS Take 1 tablet by mouth 2 (two) times a week.  . Evolocumab with Infusor ([place]) 420 MG/3.5ML SOCT Inject 420 mg into the skin every 30 (thirty) days.  [name] ezetimibe (ZETIA) 10 MG tablet Take 1 tablet by mouth once daily  . ferrous sulfate 325 (65 FE) MG tablet Take 1 tablet (325 mg total) by mouth daily.  . Glucosamine-Chondroitin (OSTEO BI-FLEX REGULAR STRENGTH PO) Take 1 tablet by mouth daily.  . hydrochlorothiazide (HYDRODIURIL) 25 MG tablet Take 1 tablet by mouth once daily  . [name] 100 MG  CAPS Take 1 capsule (100 mg total) by mouth daily.  [name] lisinopril (ZESTRIL) 20 MG tablet Take 1 tablet (20 mg total) by mouth daily.  . meloxicam (MOBIC) 7.5 MG tablet Take 1 tablet (7.5 mg total) by mouth daily.  . metoprolol tartrate (LOPRESSOR) 25 MG tablet Take 1/2 (one-half) tablet by mouth twice daily  . pantoprazole (PROTONIX) 40 MG tablet TAKE 1 TABLET BY MOUTH ONCE DAILY . APPOINTMENT REQUIRED FOR FUTURE REFILLS  . Resveratrol 100 MG CAPS Take 1 capsule by mouth daily.  . tamsulosin (FLOMAX) 0.4 MG CAPS capsule Take 1 capsule by mouth twice daily   No facility-administered encounter medications on file as of 04/10/2020.    Wt Readings from Last 3 Encounters:  02/27/20 211 lb (95.7 kg)  01/16/20 207 lb 12.8 oz (94.3 kg)  09/18/19 211 lb 8 oz (95.9 kg)    Current Diagnosis/Assessment:    Goals Addressed   None     Hypertension   BP goal is:  <130/80  Office blood pressures are  BP Readings from Last 3 Encounters:  02/27/20 138/78  01/16/20 140/90  09/18/19 (!) 142/80   Pulse Readings from Last 3 Encounters:  02/27/20 74  01/16/20 (!) 59  09/18/19 65   Kidney Function Lab Results  Component Value Date/Time   CREATININE 1.22 09/18/2019 01:17 PM   CREATININE 1.37 10/24/2018 09:57 AM   CREATININE 1.28 01/27/2013 01:49 PM   CREATININE 1.12 12/20/2012 04:00 PM   GFR 70.89 09/18/2019 01:17 PM   GFRNONAA 69 (L) 07/26/2013 02:34 AM   GFRAA 80 (L) 07/26/2013 02:34 AM   K  3.7 09/18/2019 01:17 PM   K 3.7 10/24/2018 09:57 AM   Patient checks BP at home 1-2x per week Patient home BP readings are ranging: 121/79  Patient has failed these meds in the past: n/a Patient is currently controlled on the following medications:  . HCTZ 25 mg daily . Lisinopril 20 mg daily - not on dispense report . Metoprolol tartrate 25 mg - 1/2 tab BID  We discussed diet and exercise extensively; BP goals; benefits of medications; pt denies side effects or issues  Plan  Continue  current medications and control with diet and exercise     Hyperlipidemia   LDL goal < 70 Hx CAD - cath 09/2008 Hx statin intolerance, started Repatha 2019  Lipid Panel     Component Value Date/Time   CHOL 148 04/14/2019 1040   TRIG 101 04/14/2019 1040   HDL 52 04/14/2019 1040   LDLCALC 77 04/14/2019 1040   LDLDIRECT 84.3 05/30/2014 1102    Hepatic Function Latest Ref Rng & Units 09/18/2019 10/24/2018 09/03/2017  Total Protein 6.0 - 8.3 g/dL 7.4 7.4 7.2  Albumin 3.5 - 5.2 g/dL 4.3 4.2 4.2  AST 0 - 37 U/L _0 ALT 0 - 53 U/L _1 Alk Phosphatase 39 - 117 U/L 89 90 77  Total Bilirubin 0.2 - 1.2 mg/dL 0.4 0.5 0.7  Bilirubin, Direct 0.00 - 0.40 mg/dL - - -     The 10-year ASCVD risk score Justin Bussing DC Jr., et al., 2013) is: 20.3%   Values used to calculate the score:     Age: 74 years     Sex: Male     Is Non-Hispanic African American: Yes     Diabetic: No     Tobacco smoker: No     Systolic Blood Pressure: 291 mmHg     Is BP treated: Yes     HDL Cholesterol: 52 mg/dL     Total Cholesterol: 148 mg/dL   Patient has failed these meds in past: atorvastatin, ezetimibe,  Patient is currently controlled on the following medications:  . Repatha Pushtronex 420 mg q30 days . Ezetimibe 10 mg daily . Aspirin 81 mg daily  We discussed:  diet and exercise extensively; pt reports LFT elevations and muscle weakness with atorvastatin; he reports he sometimes takes Repatha 1-2 weeks late; discussed Repatha's duration of effect and advised to take med on time.  Plan  Continue current medications and control with diet and exercise  GERD   Patient has failed these meds in past: n/a Patient is currently uncontrolled on the following medications:  . Pantoprazole 40 mg daily  We discussed:  Pt reports reflux upon lying down at night; he takes PPI in the morning and uses Tums at night to relieve symptoms; advised to move PPI to 60 min before dinner to optimize timing; may consider  increasing to BID or adding an H2RA in the evening if adjusting timing of PPI does not help.  Plan  Continue current medications  Recommend to move pantoprazole to evening dosing  Pain   Degenerative spinal changes Arthritis  Patient has failed these meds in past: n/a Patient is currently controlled on the following medications:  [name] Meloxicam 7.5 mg daily  We discussed:  Indication for meloxicam - pt was not sure what it was for and endorses taking it for years; pt reports back pain is relatively well controlled currently, he does endorse chronic joint pain; discussed benefits of Tylenol as first line treatment  for osteoarthritis; pt has had elevated LFTs on statin in 2016 but these have since normalized, currently no conditions preclude Tylenol use.  Plan  Recommended to use Tylenol prn for arthritis pain Recommended to reserve meloxicam for moderate-severe pain  BPH / Hx prostate cancer   Lab Results  Component Value Date/Time   PSA 2.65 09/18/2019 01:17 PM   PSA 2.91 10/24/2018 09:57 AM   PSA 1.83 12/22/2011 09:36 AM   Patient has failed these meds in past: n/a Patient is currently controlled on the following medications:  . Tamsulosin 0.4 mg daily  We discussed: tamsulosin is prescribed BID however pt reports taking it daily for years. PSA is stable and pt denies symptoms  Plan  Continue current medications  Anemia   CBC Latest Ref Rng & Units 09/18/2019 10/24/2018 09/03/2017  WBC 4.0 - 10.5 K/uL 5.0 4.7 4.7  Hemoglobin 13.0 - 17.0 g/dL 14.0 14.3 14.2  Hematocrit 39 - 52 % 42.8 42.7 42.7  Platelets 150 - 400 K/uL 260.0 258.0 266.0   Iron/TIBC/Ferritin/ %Sat    Component Value Date/Time   IRON 78 05/30/2014 1102   TIBC 321 05/30/2014 1102   FERRITIN 29.7 05/30/2014 1102   IRONPCTSAT 24 05/30/2014 1102   Patient has failed these meds in past: n/a Patient is currently controlled on the following medications:  . Ferrous sulfate 325 mg daily  We discussed:   Hemoglobin is in goal range; pt denies fatigue or issues with ferrous sulfate  Plan  Continue current medications  Health Maintenance   Patient is currently controlled on the following medications:  [name] Vitamin D 1000 IU daily . [name] 100 mg daily . Garlic PO . Osteo Bi Flex . Vitamin B12 500 mcg daily . Multivitamin  . Resveratrol . Cranberry twice a week  We discussed:  Patient is satisfied with current OTC regimen and denies issues  Plan  Continue current medications  Medication Management   Pt uses [place] for all medications Uses pill box? Yes Pt endorses 99% compliance  We discussed: Pt is interested in switching to [facility] mail order; discussed $0 generics through mail order plus OTC quarterly allowance. Pt will contact [employer] to switch  Plan  Switch to [facility] mail order for cost savings    Follow up: *** month phone visit  Charlene Brooke, PharmD, [facility] Clinical Pharmacist [facility] [phone]

## 2020-04-14 ENCOUNTER — Other Ambulatory Visit: Payer: Self-pay | Admitting: Internal Medicine

## 2020-04-22 NOTE — Telephone Encounter (Signed)
   Patient calling to request status of refills. He has no medication remaining

## 2020-05-08 ENCOUNTER — Other Ambulatory Visit: Payer: Self-pay

## 2020-05-08 MED ORDER — LISINOPRIL 20 MG PO TABS
20.0000 mg | ORAL_TABLET | Freq: Every day | ORAL | 0 refills | Status: DC
Start: 2020-05-08 — End: 2020-12-19

## 2020-05-16 DIAGNOSIS — Z8546 Personal history of malignant neoplasm of prostate: Secondary | ICD-10-CM | POA: Diagnosis not present

## 2020-05-16 DIAGNOSIS — N401 Enlarged prostate with lower urinary tract symptoms: Secondary | ICD-10-CM | POA: Diagnosis not present

## 2020-05-16 DIAGNOSIS — N5235 Erectile dysfunction following radiation therapy: Secondary | ICD-10-CM | POA: Diagnosis not present

## 2020-05-16 DIAGNOSIS — R350 Frequency of micturition: Secondary | ICD-10-CM | POA: Diagnosis not present

## 2020-05-28 DIAGNOSIS — E349 Endocrine disorder, unspecified: Secondary | ICD-10-CM | POA: Diagnosis not present

## 2020-06-03 ENCOUNTER — Telehealth: Payer: Self-pay

## 2020-06-03 NOTE — Telephone Encounter (Signed)
-----   Message from Rockne Menghini, [place] sent at 05/28/2020  3:20 PM EST ----- Looks like you did a [place] for him last year.  Can you renew or re-apply for him please?  He is not computer savy  Erasmo Downer

## 2020-06-03 NOTE — Telephone Encounter (Signed)
Called and was able to get the pt healthwell grant renewed. Pt voiced gratitude and understanding

## 2020-06-14 ENCOUNTER — Ambulatory Visit: Payer: Medicare HMO | Admitting: Cardiovascular Disease

## 2020-06-14 ENCOUNTER — Encounter: Payer: Self-pay | Admitting: Cardiovascular Disease

## 2020-06-14 ENCOUNTER — Other Ambulatory Visit: Payer: Self-pay

## 2020-06-14 VITALS — BP 150/90 | HR 60 | Ht 67.0 in | Wt 214.4 lb

## 2020-06-14 DIAGNOSIS — I10 Essential (primary) hypertension: Secondary | ICD-10-CM | POA: Diagnosis not present

## 2020-06-14 DIAGNOSIS — I2510 Atherosclerotic heart disease of native coronary artery without angina pectoris: Secondary | ICD-10-CM

## 2020-06-14 DIAGNOSIS — E785 Hyperlipidemia, unspecified: Secondary | ICD-10-CM

## 2020-06-14 DIAGNOSIS — E663 Overweight: Secondary | ICD-10-CM | POA: Diagnosis not present

## 2020-06-14 LAB — LIPID PANEL
Chol/HDL Ratio: 2.5 ratio (ref 0.0–5.0)
Cholesterol, Total: 117 mg/dL (ref 100–199)
HDL: 47 mg/dL (ref >39)
LDL Chol Calc (NIH): 51 mg/dL (ref 0–99)
Triglycerides: 102 mg/dL (ref 0–149)
VLDL Cholesterol Cal: 19 mg/dL (ref 5–40)

## 2020-06-14 LAB — HEPATIC FUNCTION PANEL
ALT: 14 IU/L (ref 0–44)
AST: 19 IU/L (ref 0–40)
Albumin: 4.2 g/dL (ref 3.7–4.7)
Alkaline Phosphatase: 106 IU/L (ref 44–121)
Bilirubin Total: 0.4 mg/dL (ref 0.0–1.2)
Bilirubin, Direct: 0.13 mg/dL (ref 0.00–0.40)
Total Protein: 7.3 g/dL (ref 6.0–8.5)

## 2020-06-14 NOTE — Progress Notes (Signed)
06/14/2020 Justin Ayala   Jun 05, 1950  [id number]  Primary Physician Hoyt Koch, MD Primary Cardiologist: Lorretta Harp MD Garret Reddish, [place], [place]  HPI:  Justin Ayala is a 70 y.o.  mildly overweight married African American male, father of 45, grandfather to 4 grandchildren, who I last saw office 05/20/2018... He has a history of moderate, but not critical, CAD by catheterization, which I performed Oct 12, 2008. He had a 60% to 70% proximal first diagonal branch stenosis. He had 50% distal dominant circumflex stenosis in the PDA with normal LV function. His other problems include hypertension and hyperlipidemia. He does not smoke. He has been exercising more recently. He has had a gastric polyp in the past, found in the setting of a GI workup for GI bleed. He was transfused at that time.his lipid profile followed by his primary care physician. He denies chest pain or shortness of breath. Since I saw him last he was complaining of lower extremity weakness which resolved after he stopped his Lipitor on his own. He did have elevated liver function tests as well followed by Dr. Benson Norway.because of his statin intolerance and his elevated LDL of 166 measured on 09/29/16 he was begun on Repatha which he is tolerating well.   Since I saw him a year ago he is remained completely asymptomatic denying chest pain or shortness of breath.   Current Meds  Medication Sig  . ASPIRIN ADULT LOW STRENGTH 81 MG EC tablet TAKE 1 BY MOUTH DAILY  . Cholecalciferol (VITAMIN D-3) 1000 UNITS CAPS Take by mouth. Take one daily  . Cranberry 125 MG TABS Take 1 tablet by mouth 2 (two) times a week.  . Evolocumab with Infusor ([place]) 420 MG/3.5ML SOCT Inject 420 mg into the skin every 30 (thirty) days.  [name] ezetimibe (ZETIA) 10 MG tablet Take 1 tablet by mouth once daily  . ferrous sulfate 325 (65 FE) MG tablet Take 1 tablet (325 mg total) by mouth daily.  . Glucosamine-Chondroitin (OSTEO  BI-FLEX REGULAR STRENGTH PO) Take 1 tablet by mouth daily.  . hydrochlorothiazide (HYDRODIURIL) 25 MG tablet Take 1 tablet by mouth once daily  . [name] 100 MG CAPS Take 1 capsule (100 mg total) by mouth daily.  [name] lisinopril (ZESTRIL) 20 MG tablet Take 1 tablet (20 mg total) by mouth daily.  . meloxicam (MOBIC) 7.5 MG tablet Take 1 tablet by mouth once daily  . metoprolol tartrate (LOPRESSOR) 25 MG tablet Take 1/2 (one-half) tablet by mouth twice daily  . pantoprazole (PROTONIX) 40 MG tablet TAKE 1 TABLET BY MOUTH ONCE DAILY . APPOINTMENT REQUIRED FOR FUTURE REFILLS  . Resveratrol 100 MG CAPS Take 1 capsule by mouth daily.  . tamsulosin (FLOMAX) 0.4 MG CAPS capsule Take 1 capsule by mouth twice daily     Allergies  Allergen Reactions  . Ace Inhibitors Cough    Social History   Socioeconomic History  . Marital status: Married    Spouse name: Not on file  . Number of children: 3  . Years of education: BS   . Highest education level: Not on file  Occupational History    Employer: [employer]  Tobacco Use  . Smoking status: Never Smoker  . Smokeless tobacco: Never Used  Substance and Sexual Activity  . Alcohol use: No  . Drug use: No  . Sexual activity: Yes    Partners: Female  Other Topics Concern  . Not on file  Social History Narrative  married x 30+years; 3 children, 4 grandsons    no smoking or ETOH;    Retired Merchant navy officer formerly at [employer];    likes to golf (golfs 3-4 times a week); no exercise and rides cart while golfing         Social Determinants of Radio broadcast assistant Strain: Low Risk   . Difficulty of Paying Living Expenses: Not very hard  Food Insecurity: Not on file  Transportation Needs: Not on file  Physical Activity: Not on file  Stress: Not on file  Social Connections: Not on file  Intimate Partner Violence: Not on file     Review of Systems: General: negative for chills, fever, night sweats or weight changes.   Cardiovascular: negative for chest pain, dyspnea on exertion, edema, orthopnea, palpitations, paroxysmal nocturnal dyspnea or shortness of breath Dermatological: negative for rash Respiratory: negative for cough or wheezing Urologic: negative for hematuria Abdominal: negative for nausea, vomiting, diarrhea, bright red blood per rectum, melena, or hematemesis Neurologic: negative for visual changes, syncope, or dizziness All other systems reviewed and are otherwise negative except as noted above.    Blood pressure (!) 150/90, pulse 60, height 5\' 7"  (1.702 m), weight 214 lb 6.4 oz (97.3 kg).  General appearance: alert and no distress Neck: no adenopathy, no carotid bruit, no JVD, supple, symmetrical, trachea midline and thyroid not enlarged, symmetric, no tenderness/mass/nodules Lungs: clear to auscultation bilaterally Heart: regular rate and rhythm, S1, S2 normal, no murmur, click, rub or gallop Extremities: extremities normal, atraumatic, no cyanosis or edema Pulses: 2+ and symmetric Skin: Skin color, texture, turgor normal. No rashes or lesions Neurologic: Alert and oriented X 3, normal strength and tone. Normal symmetric reflexes. Normal coordination and gait  EKG normal sinus rhythm at 60 with borderline voltage for LVH and nonspecific ST and T wave changes.  I personally reviewed this EKG.  ASSESSMENT AND PLAN:   Hyperlipidemia History of hyperlipidemia on Zetia and Repatha.  We will recheck a lipid liver profile this morning.  HYPERTENSION, BENIGN SYSTEMIC History of essential hypertension blood pressure measured today 150/90.  He is on hydrochlorothiazide and lisinopril as well as metoprolol.  Overweight BMI of 34.  He is gained 20 pounds since I saw him 2 years ago.  He is aware this and plans on reducing his weight.  CAD (coronary artery disease) History of CAD status post cardiac catheterization with performed by myself 10/12/2008 revealing 60 to 70% proximal first  diagonal branch stenosis, 50% distal dominant circumflex stenosis in the PDA with normal LV function.  He is completely asymptomatic.      Lorretta Harp MD FACP,FACC,FAHA, [facility] 06/14/2020 10:30 AM

## 2020-06-14 NOTE — Assessment & Plan Note (Signed)
History of essential hypertension blood pressure measured today 150/90.  He is on hydrochlorothiazide and lisinopril as well as metoprolol.

## 2020-06-14 NOTE — Assessment & Plan Note (Signed)
History of hyperlipidemia on Zetia and Repatha.  We will recheck a lipid liver profile this morning.

## 2020-06-14 NOTE — Patient Instructions (Signed)
Medication Instructions:  No Changes In Medications at this time.  *If you need a refill on your cardiac medications before your next appointment, please call your pharmacy*  Lab Work: LIPID/LIVER BLOOD WORK TODAY If you have labs (blood work) drawn today and your tests are completely normal, you will receive your results only by: MyChart Message (if you have MyChart) OR A paper copy in the mail If you have any lab test that is abnormal or we need to change your treatment, we will call you to review the results.  Follow-Up: At [facility], you and your health needs are our priority.  As part of our continuing mission to provide you with exceptional heart care, we have created designated Provider Care Teams.  These Care Teams include your primary Cardiologist (physician) and Advanced Practice Providers (APPs -  Physician Assistants and Nurse Practitioners) who all work together to provide you with the care you need, when you need it.  Your next appointment:   1 year(s)  The format for your next appointment:   In Person  Provider:   Jonathan Berry, MD  

## 2020-06-14 NOTE — Assessment & Plan Note (Signed)
History of CAD status post cardiac catheterization with performed by myself 10/12/2008 revealing 60 to 70% proximal first diagonal branch stenosis, 50% distal dominant circumflex stenosis in the PDA with normal LV function.  He is completely asymptomatic.

## 2020-06-14 NOTE — Assessment & Plan Note (Signed)
BMI of 34.  He is gained 20 pounds since I saw him 2 years ago.  He is aware this and plans on reducing his weight.

## 2020-06-18 ENCOUNTER — Other Ambulatory Visit: Payer: Self-pay | Admitting: Internal Medicine

## 2020-07-17 ENCOUNTER — Other Ambulatory Visit: Payer: Self-pay | Admitting: Internal Medicine

## 2020-07-19 ENCOUNTER — Other Ambulatory Visit: Payer: Self-pay | Admitting: Cardiovascular Disease

## 2020-07-26 ENCOUNTER — Other Ambulatory Visit: Payer: Self-pay | Admitting: Internal Medicine

## 2020-08-23 ENCOUNTER — Telehealth: Payer: Self-pay | Admitting: Cardiovascular Disease

## 2020-08-23 NOTE — Telephone Encounter (Signed)
I attempted to contact patient to discuss- unable to leave a voicemail as it was full.  Will try again.

## 2020-08-23 NOTE — Telephone Encounter (Signed)
Patient states he has been experiencing a tingling sensation in his chest. Denies chest pain, states it feels more like gas or indigestion. States he is unsure whether he strained a muscle. He reports this morning when he woke up his left hand was also swollen. He states the swelling has now gone down, but he is unsure what this means. States he may have slept on his left arm.  Pt c/o swelling: STAT is pt has developed SOB within 24 hours  How much weight have you gained and in what time span? * -No weight gain 1) If swelling, where is the swelling located?  -Left hand  2) Are you currently taking a fluid pill?  -Yes, hydrochlorothiazide (HYDRODIURIL) 25 MG tablet  3) Are you currently SOB?  -No  4) Do you have a log of your daily weights (if so, list)?  -No log available  5) Have you gained 3 pounds in a day or 5 pounds in a week?  -No  6) Have you traveled recently?  -No

## 2020-08-27 NOTE — Telephone Encounter (Signed)
Unable to reach pt or leave a message mailbox is full 

## 2020-08-28 NOTE — Telephone Encounter (Signed)
Called patient back, to advise on how he was feeling.  He states he is feeling better, but he was upset he had no heard anything until today.  I did advise with patient that we had tried to contact him twice with no response and unable to leave a message. I did try a different number today in an attempt to reach someone. Patient advised he was feeling better and no longer needed [place]. I advised with patient that I was glad to hear he was doing better.  Will remove from triage.

## 2020-09-10 ENCOUNTER — Encounter: Payer: Self-pay | Admitting: Internal Medicine

## 2020-09-10 ENCOUNTER — Ambulatory Visit (INDEPENDENT_AMBULATORY_CARE_PROVIDER_SITE_OTHER): Payer: Medicare HMO | Admitting: Internal Medicine

## 2020-09-10 ENCOUNTER — Other Ambulatory Visit: Payer: Self-pay

## 2020-09-10 VITALS — BP 132/80 | HR 62 | Temp 98.5°F | Resp 18 | Ht 67.0 in | Wt 211.4 lb

## 2020-09-10 DIAGNOSIS — R55 Syncope and collapse: Secondary | ICD-10-CM | POA: Diagnosis not present

## 2020-09-10 NOTE — Patient Instructions (Signed)
The EKG is not changed from before. We are checking the labs today.

## 2020-09-10 NOTE — Progress Notes (Signed)
   Subjective:   Patient ID: Justin Ayala, male    DOB: [birth year], 70 y.o.   MRN: [id number]  HPI The patient is a 70 YO man coming in for concerns about syncope. He was working in the yard on 09/07/20 and did eat breakfast. Martin Majestic to work in the yard several hours. Came inside and drank some liquids then returned to work outside for several more hours. He then came inside and was feeling okay. Sat down and started feeling dizzy. He tried to rest but did pass out. Woke to someone calling his name. Felt okay afterwards and denies being confused. Denies loss of bowel or bladder. Denies hitting head as he was sitting down at the time. Denies fevers or chills. Otherwise feeling well. Was a little tired rest of day. Next day feeling well. Does have some episodes of feeling dizzy at times but no prior syncope. Denies chest pains during episode or regular. Rarely gets some stinging pain in the chest which lasts less than a second. Typically more at rest but can come anytime.   Review of Systems  Constitutional: Negative.   HENT: Negative.   Eyes: Negative.   Respiratory: Negative for cough, chest tightness and shortness of breath.   Cardiovascular: Negative for chest pain, palpitations and leg swelling.  Gastrointestinal: Negative for abdominal distention, abdominal pain, constipation, diarrhea, nausea and vomiting.  Musculoskeletal: Negative.   Skin: Negative.   Neurological: Positive for dizziness and syncope.  Psychiatric/Behavioral: Negative.     Objective:  Physical Exam Constitutional:      Appearance: He is well-developed.  HENT:     Head: Normocephalic and atraumatic.  Cardiovascular:     Rate and Rhythm: Normal rate and regular rhythm.  Pulmonary:     Effort: Pulmonary effort is normal. No respiratory distress.     Breath sounds: Normal breath sounds. No wheezing or rales.  Abdominal:     General: Bowel sounds are normal. There is no distension.     Palpations: Abdomen is soft.      Tenderness: There is no abdominal tenderness. There is no rebound.  Musculoskeletal:     Cervical back: Normal range of motion.  Skin:    General: Skin is warm and dry.  Neurological:     Mental Status: He is alert and oriented to person, place, and time.     Coordination: Coordination normal.     Vitals:   09/10/20 1537  BP: 132/80  Pulse: 62  Resp: 18  Temp: 98.5 F (36.9 C)  TempSrc: Oral  SpO2: 99%  Weight: 211 lb 6.4 oz (95.9 kg)  Height: 5\' 7"  (1.702 m)   EKG: Rate 63, axis normal, interval normal, sinus, no st or t wave changes, no significant change compared to prior Jan 2022  This visit occurred during the SARS-CoV-2 public health emergency.  Safety protocols were in place, including screening questions prior to the visit, additional usage of staff PPE, and extensive cleaning of exam room while observing appropriate contact time as indicated for disinfecting solutions.   Assessment & Plan:

## 2020-09-11 DIAGNOSIS — H52213 Irregular astigmatism, bilateral: Secondary | ICD-10-CM | POA: Diagnosis not present

## 2020-09-11 DIAGNOSIS — H2511 Age-related nuclear cataract, right eye: Secondary | ICD-10-CM | POA: Diagnosis not present

## 2020-09-11 DIAGNOSIS — H2513 Age-related nuclear cataract, bilateral: Secondary | ICD-10-CM | POA: Diagnosis not present

## 2020-09-11 DIAGNOSIS — H5703 Miosis: Secondary | ICD-10-CM | POA: Diagnosis not present

## 2020-09-11 DIAGNOSIS — H25013 Cortical age-related cataract, bilateral: Secondary | ICD-10-CM | POA: Diagnosis not present

## 2020-09-11 DIAGNOSIS — H524 Presbyopia: Secondary | ICD-10-CM | POA: Diagnosis not present

## 2020-09-11 DIAGNOSIS — R55 Syncope and collapse: Secondary | ICD-10-CM | POA: Insufficient documentation

## 2020-09-11 DIAGNOSIS — H40023 Open angle with borderline findings, high risk, bilateral: Secondary | ICD-10-CM | POA: Diagnosis not present

## 2020-09-11 LAB — COMPREHENSIVE METABOLIC PANEL
ALT: 18 U/L (ref 0–53)
AST: 22 U/L (ref 0–37)
Albumin: 4.1 g/dL (ref 3.5–5.2)
Alkaline Phosphatase: 83 U/L (ref 39–117)
BUN: 20 mg/dL (ref 6–23)
CO2: 31 mEq/L (ref 19–32)
Calcium: 9.6 mg/dL (ref 8.4–10.5)
Chloride: 102 mEq/L (ref 96–112)
Creatinine, Ser: 1.32 mg/dL (ref 0.40–1.50)
GFR: 54.15 mL/min — ABNORMAL LOW (ref >60.00)
Glucose, Bld: 85 mg/dL (ref 70–99)
Potassium: 3.4 mEq/L — ABNORMAL LOW (ref 3.5–5.1)
Sodium: 140 mEq/L (ref 135–145)
Total Bilirubin: 0.4 mg/dL (ref 0.2–1.2)
Total Protein: 7.6 g/dL (ref 6.0–8.3)

## 2020-09-11 LAB — CBC
HCT: 42.3 % (ref 39.0–52.0)
Hemoglobin: 14 g/dL (ref 13.0–17.0)
MCHC: 33.1 g/dL (ref 30.0–36.0)
MCV: 85.1 fl (ref 78.0–100.0)
Platelets: 231 10*3/uL (ref 150.0–400.0)
RBC: 4.97 Mil/uL (ref 4.22–5.81)
RDW: 13.6 % (ref 11.5–15.5)
WBC: 5.1 10*3/uL (ref 4.0–10.5)

## 2020-09-11 NOTE — Assessment & Plan Note (Signed)
EKG done without change in office at visit. Checking labs to rule out recurrent anemia. If labs normal will get carotid doppler due to recurrent episodes of dizziness with change in head position and known CAD. Depending on results he may need further assessment of known blockages.

## 2020-09-12 IMAGING — PT NUCLEAR MEDICINE  NOPR SKULL BASE TO THIGH
7 series · 16 of 16 positions shown · non-contrast
Comparison: CT 09/14/2014

CLINICAL DATA: Prostate carcinoma with biochemical recurrence.

EXAM:
NUCLEAR MEDICINE PET SKULL BASE TO THIGH
TECHNIQUE: 9.2 mCi F-18 Fluciclovine was injected intravenously. Full-ring PET
imaging was performed from the skull base to thigh after the
radiotracer. CT data was obtained and used for attenuation
correction and anatomic localization.

[Series 3: pet sk_thigh ac · axial · 5.0mm · 4.07mm/px · z∈[+905,+1737]mm · 4 of 209 slices shown]
[im 1/209]
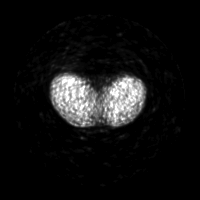
[im 70/209]
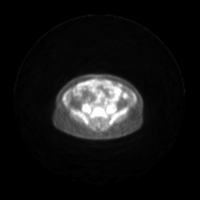
[im 139/209]
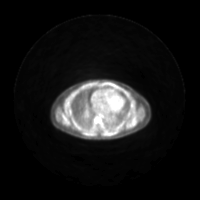
[im 209/209]
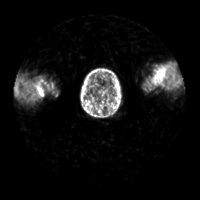

[Series 4: ct sk_thigh 5.0 b31f · axial · 0.98mm/px · z∈[+905,+1737]mm · 3 of 209 slices shown]
[im 1/209  soft-tissue]
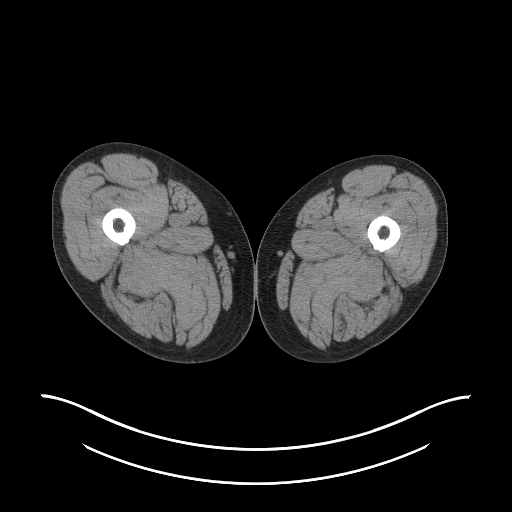
[im 105/209  soft-tissue]
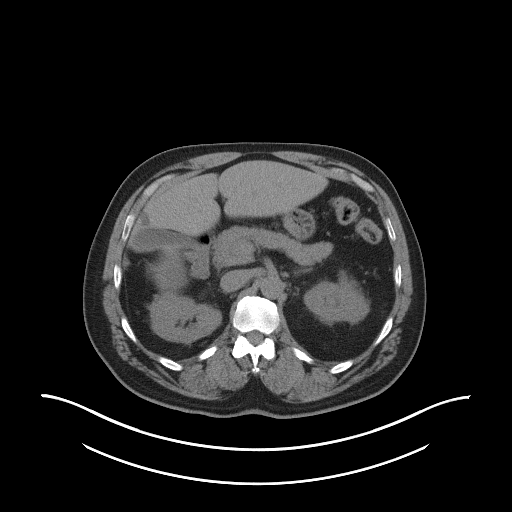
[im 209/209  soft-tissue]
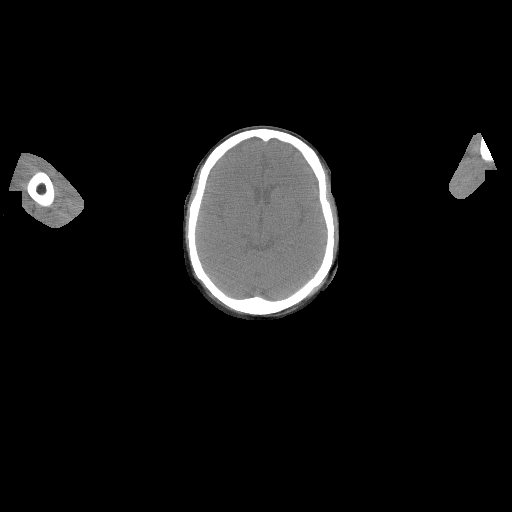

[Series 5: pet sk_thigh nac · axial · 5.0mm · 4.07mm/px · z∈[+905,+1737]mm · 3 of 209 slices shown]
[im 1/209]
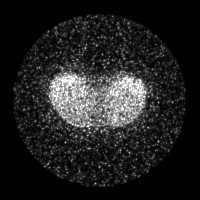
[im 105/209]
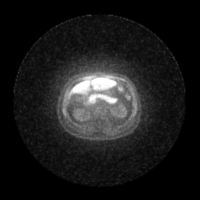
[im 209/209]
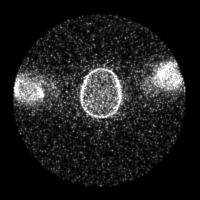

[Series 8: ct sk_thigh 5.0 b70f (id)_bone · axial · 0.61mm/px · 1 of 60 slices shown]
[im 1/60  soft-tissue]
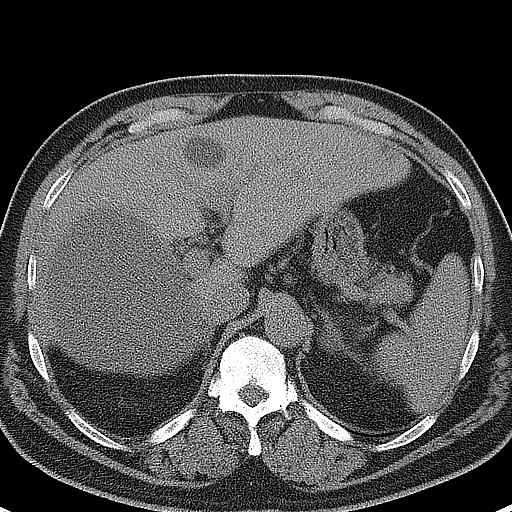

[Series 603: mip range · coronal · 1.73mm/px · 1 of 32 slices shown]
[im 1/32]
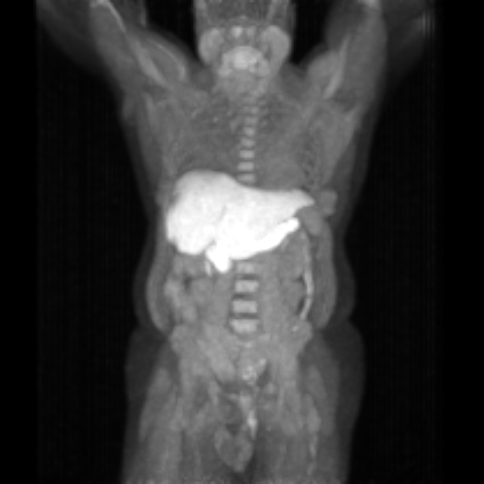

[Series 604: range-ct sk_thigh 5.0 (id)<alpha range> · 1 of 64 slices shown (1 of 2)]
[im 1/64]
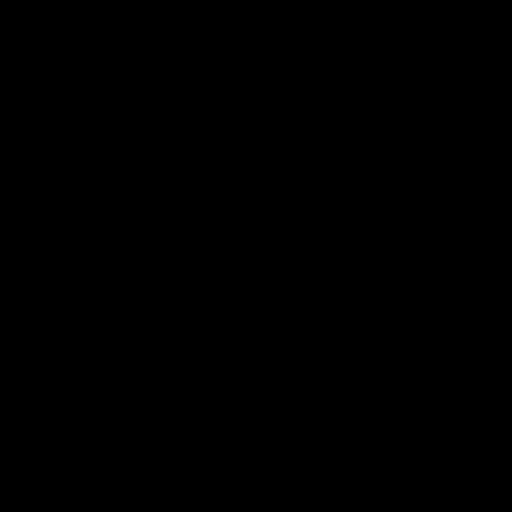

[Series 606: range-ct sk_thigh 5.0 (id)<alpha range> · 3 of 199 slices shown (2 of 2)]
[im 1/199]
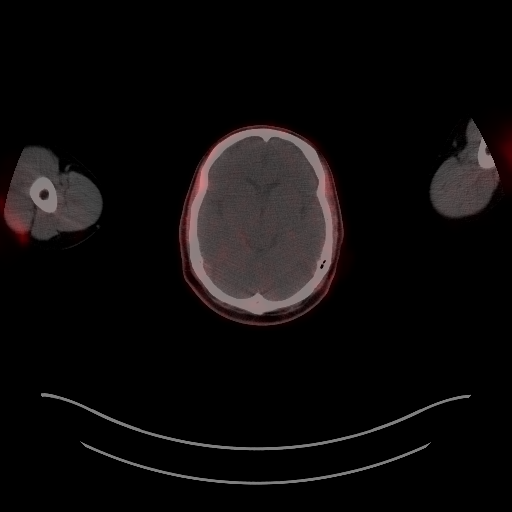
[im 100/199]
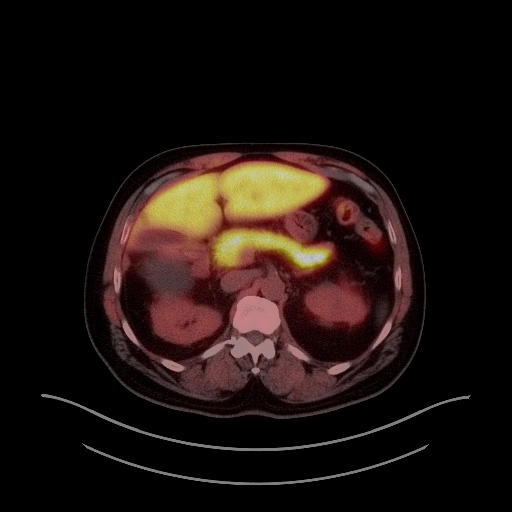
[im 199/199]
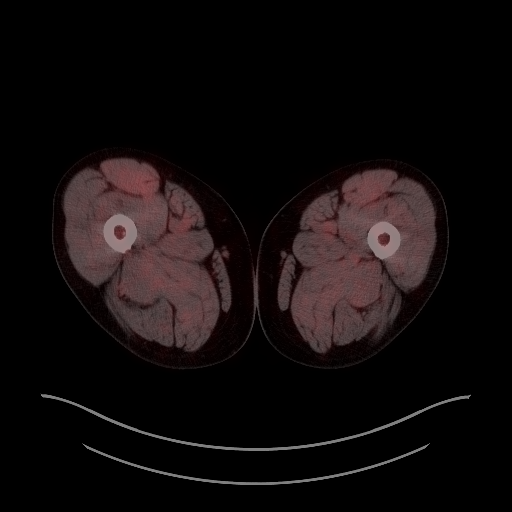

[16 of 16 positions shown; findings below may reference images not displayed]

FINDINGS: NECK

No radiotracer activity in neck lymph nodes.

Incidental CT finding: None

CHEST

No radiotracer accumulation within mediastinal or hilar lymph nodes.
No suspicious pulmonary nodules on the CT scan.

Incidental CT finding: None

ABDOMEN/PELVIS

Prostate: No focal activity in the prostate bed. Brachytherapy seeds
noted.

Lymph nodes: No abnormal radiotracer accumulation within pelvic or
abdominal nodes.

Liver: No evidence of liver metastasis. Large benign cyst in the
RIGHT hepatic lobe measures 12 cm.

Incidental CT finding: Small benign LEFT adrenal adenoma.

SKELETON

No focal  activity to suggest skeletal metastasis.
IMPRESSION: 1. No evidence of local prostate cancer recurrence in the prostate
bed.
2. No evidence of prostate cancer nodal metastasis in the pelvis or
abdomen.
3. No evidence distant metastatic disease

## 2020-09-13 ENCOUNTER — Encounter: Payer: Self-pay | Admitting: Internal Medicine

## 2020-09-13 DIAGNOSIS — R55 Syncope and collapse: Secondary | ICD-10-CM

## 2020-09-18 ENCOUNTER — Encounter: Payer: Self-pay | Admitting: Internal Medicine

## 2020-09-18 ENCOUNTER — Other Ambulatory Visit: Payer: Self-pay

## 2020-09-18 ENCOUNTER — Ambulatory Visit (INDEPENDENT_AMBULATORY_CARE_PROVIDER_SITE_OTHER): Payer: Medicare HMO | Admitting: Internal Medicine

## 2020-09-18 ENCOUNTER — Encounter (INDEPENDENT_AMBULATORY_CARE_PROVIDER_SITE_OTHER): Payer: Medicare HMO | Admitting: Ophthalmology

## 2020-09-18 VITALS — BP 122/90 | HR 66 | Temp 98.6°F | Resp 18 | Ht 67.0 in | Wt 213.0 lb

## 2020-09-18 DIAGNOSIS — Z0000 Encounter for general adult medical examination without abnormal findings: Secondary | ICD-10-CM

## 2020-09-18 DIAGNOSIS — E278 Other specified disorders of adrenal gland: Secondary | ICD-10-CM | POA: Diagnosis not present

## 2020-09-18 DIAGNOSIS — Z8546 Personal history of malignant neoplasm of prostate: Secondary | ICD-10-CM

## 2020-09-18 DIAGNOSIS — H35033 Hypertensive retinopathy, bilateral: Secondary | ICD-10-CM | POA: Diagnosis not present

## 2020-09-18 DIAGNOSIS — E7800 Pure hypercholesterolemia, unspecified: Secondary | ICD-10-CM

## 2020-09-18 DIAGNOSIS — H43812 Vitreous degeneration, left eye: Secondary | ICD-10-CM

## 2020-09-18 DIAGNOSIS — I10 Essential (primary) hypertension: Secondary | ICD-10-CM

## 2020-09-18 NOTE — Progress Notes (Signed)
Subjective:   Patient ID: Justin Ayala, male    DOB: [birth year], 70 y.o.   MRN: [id number]  HPI Here for medicare wellness and physical, no new complaints. Please see A/P for status and treatment of chronic medical problems.   Diet: heart healthy  Physical activity: active golfs Depression/mood screen: negative Hearing: intact to whispered voice, ringing bilatrally Visual acuity: grossly normal with lens, upcoming cataract surgery next week, performs annual eye exam  ADLs: capable Fall risk: none Home safety: good Cognitive evaluation: intact to orientation, naming, recall and repetition EOL planning: adv directives discussed, not in place  [employer] Visit from 09/10/2020 in [place] at [facility] Total Score 0      I have personally reviewed and have noted 1. The patient's medical and social history - reviewed today no changes 2. Their use of alcohol, tobacco or illicit drugs 3. Their current medications and supplements 4. The patient's functional ability including ADL's, fall risks, home safety risks and hearing or visual impairment. 5. Diet and physical activities 6. Evidence for depression or mood disorders 7. Care team reviewed and updated 8.  The patient is not on an opioid pain medication.   Patient Care Team: Hoyt Koch, MD as PCP - General (Internal Medicine) Lorretta Harp, MD as PCP - Cardiology (Cardiology) Charlton Haws, [facility] as Pharmacist (Pharmacist) Past Medical History:  Diagnosis Date  . Allergic rhinitis   . Anemia 2010  . CAD (coronary artery disease)    60-70% prox 1st diagonal branch stenosis, 50% dominant circumflex stenosis in PDA, normal LV function (by 10/12/2008 cath)  . GERD (gastroesophageal reflux disease)   . History of cardiovascular stress test 10/02/2008   exercise tolerance test - abnormal test - subsequent cath   . HLD (hyperlipidemia)   . HTN (hypertension)   . Kidney stones   .  Overweight(278.02)   . Peptic ulcer   . Prostate cancer [facility])    Past Surgical History:  Procedure Laterality Date  . APPENDECTOMY  12/2008   Dr. Clyda Greener  . BIOPSY PROSTATE  03/19/11   gleason 3+3=6, volume 34 cc, psa 01/06/11 5.00  . CARDIAC CATHETERIZATION  2010   non-critical stenosis  . FLEXIBLE SIGMOIDOSCOPY N/A 08/18/2013   Procedure: FLEXIBLE SIGMOIDOSCOPY;  Surgeon: Beryle Beams, MD;  Location: [facility];  Service: Endoscopy;  Laterality: N/A;  . HOT HEMOSTASIS N/A 08/18/2013   Procedure: HOT HEMOSTASIS (ARGON PLASMA COAGULATION/BICAP);  Surgeon: Beryle Beams, MD;  Location: [facility];  Service: Endoscopy;  Laterality: N/A;  AVM - sig colon  . RADIOACTIVE SEED IMPLANT  08/14/2011   Procedure: RADIOACTIVE SEED IMPLANT;  Surgeon: Claybon Jabs, MD;  Location: [facility];  Service: Urology;  Laterality: N/A;  54  SEEDS IMPLANTED IN PROSTATE  . TONSILLECTOMY     Family History  Problem Relation Age of Onset  . Hypertension Father        also organ failure  . Stroke Father   . Hypertension Mother   . Cervical cancer Mother   . Prostate cancer Brother        seed implant  . Heart failure Paternal Grandmother   . Heart failure Paternal Grandfather   . Diabetes Neg Hx    Review of Systems  Constitutional: Negative.   HENT: Negative.   Eyes: Negative.   Respiratory: Negative for cough, chest tightness and shortness of breath.   Cardiovascular: Negative for chest pain, palpitations and leg swelling.  Gastrointestinal: Negative for abdominal distention, abdominal pain, constipation, diarrhea, nausea and vomiting.  Musculoskeletal: Positive for back pain. Negative for arthralgias.  Skin: Negative.   Neurological: Negative.   Psychiatric/Behavioral: Negative.     Objective:  Physical Exam Constitutional:      Appearance: He is well-developed. He is obese.  HENT:     Head: Normocephalic and atraumatic.  Cardiovascular:     Rate and Rhythm: Normal  rate and regular rhythm.  Pulmonary:     Effort: Pulmonary effort is normal. No respiratory distress.     Breath sounds: Normal breath sounds. No wheezing or rales.  Abdominal:     General: Bowel sounds are normal. There is no distension.     Palpations: Abdomen is soft.     Tenderness: There is no abdominal tenderness. There is no rebound.  Musculoskeletal:     Cervical back: Normal range of motion.  Skin:    General: Skin is warm and dry.  Neurological:     Mental Status: He is alert and oriented to person, place, and time.     Coordination: Coordination normal.     Vitals:   09/18/20 1016  BP: 122/90  Pulse: 66  Resp: 18  Temp: 98.6 F (37 C)  TempSrc: Oral  SpO2: 99%  Weight: 213 lb (96.6 kg)  Height: 5\' 7"  (1.702 m)   This visit occurred during the SARS-CoV-2 public health emergency.  Safety protocols were in place, including screening questions prior to the visit, additional usage of staff PPE, and extensive cleaning of exam room while observing appropriate contact time as indicated for disinfecting solutions.   Assessment & Plan:

## 2020-09-18 NOTE — Patient Instructions (Addendum)
We will get the results from the ultrasound either Friday or Monday.  Health Maintenance, Male Adopting a healthy lifestyle and getting preventive care are important in promoting health and wellness. Ask your health care provider about:  The right schedule for you to have regular tests and exams.  Things you can do on your own to prevent diseases and keep yourself healthy. What should I know about diet, weight, and exercise? Eat a healthy diet  Eat a diet that includes plenty of vegetables, fruits, low-fat dairy products, and lean protein.  Do not eat a lot of foods that are high in solid fats, added sugars, or sodium.   Maintain a healthy weight Body mass index (BMI) is a measurement that can be used to identify possible weight problems. It estimates body fat based on height and weight. Your health care provider can help determine your BMI and help you achieve or maintain a healthy weight. Get regular exercise Get regular exercise. This is one of the most important things you can do for your health. Most adults should:  Exercise for at least 150 minutes each week. The exercise should increase your heart rate and make you sweat (moderate-intensity exercise).  Do strengthening exercises at least twice a week. This is in addition to the moderate-intensity exercise.  Spend less time sitting. Even light physical activity can be beneficial. Watch cholesterol and blood lipids Have your blood tested for lipids and cholesterol at 72 years of age, then have this test every 5 years. You may need to have your cholesterol levels checked more often if:  Your lipid or cholesterol levels are high.  You are older than 72 years of age.  You are at high risk for heart disease. What should I know about cancer screening? Many types of cancers can be detected early and may often be prevented. Depending on your health history and family history, you may need to have cancer screening at various ages. This  may include screening for:  Colorectal cancer.  Prostate cancer.  Skin cancer.  Lung cancer. What should I know about heart disease, diabetes, and high blood pressure? Blood pressure and heart disease  High blood pressure causes heart disease and increases the risk of stroke. This is more likely to develop in people who have high blood pressure readings, are of African descent, or are overweight.  Talk with your health care provider about your target blood pressure readings.  Have your blood pressure checked: ? Every 3-5 years if you are 54-67 years of age. ? Every year if you are 2 years old or older.  If you are between the ages of 66 and 79 and are a current or former smoker, ask your health care provider if you should have a one-time screening for abdominal aortic aneurysm (AAA). Diabetes Have regular diabetes screenings. This checks your fasting blood sugar level. Have the screening done:  Once every three years after age 103 if you are at a normal weight and have a low risk for diabetes.  More often and at a younger age if you are overweight or have a high risk for diabetes. What should I know about preventing infection? Hepatitis B If you have a higher risk for hepatitis B, you should be screened for this virus. Talk with your health care provider to find out if you are at risk for hepatitis B infection. Hepatitis C Blood testing is recommended for:  Everyone born from [birth year] through 1967.  Anyone with known risk factors  for hepatitis C. Sexually transmitted infections (STIs)  You should be screened each year for STIs, including gonorrhea and chlamydia, if: ? You are sexually active and are younger than 72 years of age. ? You are older than 72 years of age and your health care provider tells you that you are at risk for this type of infection. ? Your sexual activity has changed since you were last screened, and you are at increased risk for chlamydia or gonorrhea. Ask  your health care provider if you are at risk.  Ask your health care provider about whether you are at high risk for HIV. Your health care provider may recommend a prescription medicine to help prevent HIV infection. If you choose to take medicine to prevent HIV, you should first get tested for HIV. You should then be tested every 3 months for as long as you are taking the medicine. Follow these instructions at home: Lifestyle  Do not use any products that contain nicotine or tobacco, such as cigarettes, e-cigarettes, and chewing tobacco. If you need help quitting, ask your health care provider.  Do not use street drugs.  Do not share needles.  Ask your health care provider for help if you need support or information about quitting drugs. Alcohol use  Do not drink alcohol if your health care provider tells you not to drink.  If you drink alcohol: ? Limit how much you have to 0-2 drinks a day. ? Be aware of how much alcohol is in your drink. In the [place] one drink equals one 12 oz bottle of beer (355 mL), one 5 oz glass of wine (148 mL), or one 1 oz glass of hard liquor (44 mL). General instructions  Schedule regular health, dental, and eye exams.  Stay current with your vaccines.  Tell your health care provider if: ? You often feel depressed. ? You have ever been abused or do not feel safe at home. Summary  Adopting a healthy lifestyle and getting preventive care are important in promoting health and wellness.  Follow your health care provider's instructions about healthy diet, exercising, and getting tested or screened for diseases.  Follow your health care provider's instructions on monitoring your cholesterol and blood pressure. This information is not intended to replace advice given to you by your health care provider. Make sure you discuss any questions you have with your health care provider. Document Revised: 05/04/2018 Document Reviewed: 05/04/2018 Elsevier Patient  Education  2021 [employer].

## 2020-09-19 NOTE — Assessment & Plan Note (Signed)
Continue flomax and intermittent PSA monitoring. No symptoms to suggestion recurrence.

## 2020-09-19 NOTE — Assessment & Plan Note (Signed)
BP at goal on lisinopril and hctz and metoprolol. Recent labs done and reviewed without indication for change in therapy.

## 2020-09-19 NOTE — Assessment & Plan Note (Signed)
Flu shot yearly. Covid-19 booster recommended 2 shots done. Pneumonia declines today. Shingrix counseled to get at pharmacy. Tetanus due 2024. Colonoscopy due 2025. Counseled about sun safety and mole surveillance. Counseled about the dangers of distracted driving. Given 10 year screening recommendations.

## 2020-09-19 NOTE — Assessment & Plan Note (Signed)
Taking repatha and recent lipids at goal.

## 2020-09-19 NOTE — Assessment & Plan Note (Signed)
Stable multiple imaging studies and no further imaging required for this.

## 2020-09-20 ENCOUNTER — Other Ambulatory Visit: Payer: Self-pay

## 2020-09-20 ENCOUNTER — Ambulatory Visit (HOSPITAL_COMMUNITY)
Admission: RE | Admit: 2020-09-20 | Discharge: 2020-09-20 | Disposition: A | Discharge status: Home / Self Care | Payer: Medicare HMO | Source: Ambulatory Visit | Attending: Cardiology | Admitting: Cardiology

## 2020-09-20 ENCOUNTER — Encounter: Payer: Self-pay | Admitting: Internal Medicine

## 2020-09-20 DIAGNOSIS — R55 Syncope and collapse: Secondary | ICD-10-CM | POA: Diagnosis not present

## 2020-09-23 ENCOUNTER — Telehealth: Payer: Self-pay

## 2020-09-23 MED ORDER — PANTOPRAZOLE SODIUM 40 MG PO TBEC: DELAYED_RELEASE_TABLET | ORAL | 3 refills | Status: DC

## 2020-09-23 MED ORDER — TAMSULOSIN HCL 0.4 MG PO CAPS: 0.4000 mg | ORAL_CAPSULE | Freq: Two times a day (BID) | ORAL | 1 refills | Status: DC

## 2020-09-23 NOTE — Telephone Encounter (Signed)
Medication has been sent to the pharmacy. 

## 2020-09-24 DIAGNOSIS — H2511 Age-related nuclear cataract, right eye: Secondary | ICD-10-CM | POA: Diagnosis not present

## 2020-09-24 DIAGNOSIS — H25811 Combined forms of age-related cataract, right eye: Secondary | ICD-10-CM | POA: Diagnosis not present

## 2020-09-24 DIAGNOSIS — H5703 Miosis: Secondary | ICD-10-CM | POA: Diagnosis not present

## 2020-10-15 ENCOUNTER — Encounter: Payer: Self-pay | Admitting: Internal Medicine

## 2020-10-22 ENCOUNTER — Other Ambulatory Visit: Payer: Self-pay

## 2020-10-22 MED ORDER — METOPROLOL TARTRATE 25 MG PO TABS: ORAL_TABLET | ORAL | 0 refills | Status: DC

## 2020-10-22 MED ORDER — EZETIMIBE 10 MG PO TABS: 10.0000 mg | ORAL_TABLET | Freq: Every day | ORAL | 0 refills | Status: DC

## 2020-10-22 MED ORDER — MELOXICAM 7.5 MG PO TABS: 7.5000 mg | ORAL_TABLET | Freq: Every day | ORAL | 0 refills | Status: DC

## 2020-10-22 MED ORDER — HYDROCHLOROTHIAZIDE 25 MG PO TABS: 1.0000 | ORAL_TABLET | Freq: Every day | ORAL | 1 refills | Status: DC

## 2020-10-28 DIAGNOSIS — H2512 Age-related nuclear cataract, left eye: Secondary | ICD-10-CM | POA: Diagnosis not present

## 2020-10-28 DIAGNOSIS — H25012 Cortical age-related cataract, left eye: Secondary | ICD-10-CM | POA: Diagnosis not present

## 2020-11-06 DIAGNOSIS — Z8546 Personal history of malignant neoplasm of prostate: Secondary | ICD-10-CM | POA: Diagnosis not present

## 2020-11-12 DIAGNOSIS — H25012 Cortical age-related cataract, left eye: Secondary | ICD-10-CM | POA: Diagnosis not present

## 2020-11-12 DIAGNOSIS — H5703 Miosis: Secondary | ICD-10-CM | POA: Diagnosis not present

## 2020-11-12 DIAGNOSIS — H2512 Age-related nuclear cataract, left eye: Secondary | ICD-10-CM | POA: Diagnosis not present

## 2020-11-12 DIAGNOSIS — H25812 Combined forms of age-related cataract, left eye: Secondary | ICD-10-CM | POA: Diagnosis not present

## 2020-11-13 DIAGNOSIS — R3912 Poor urinary stream: Secondary | ICD-10-CM | POA: Diagnosis not present

## 2020-11-13 DIAGNOSIS — N401 Enlarged prostate with lower urinary tract symptoms: Secondary | ICD-10-CM | POA: Diagnosis not present

## 2020-11-13 DIAGNOSIS — N5235 Erectile dysfunction following radiation therapy: Secondary | ICD-10-CM | POA: Diagnosis not present

## 2020-11-13 DIAGNOSIS — Z8546 Personal history of malignant neoplasm of prostate: Secondary | ICD-10-CM | POA: Diagnosis not present

## 2020-12-02 DIAGNOSIS — Z0100 Encounter for examination of eyes and vision without abnormal findings: Secondary | ICD-10-CM | POA: Diagnosis not present

## 2020-12-17 ENCOUNTER — Telehealth: Payer: Self-pay | Admitting: Pharmacist

## 2020-12-17 DIAGNOSIS — I10 Essential (primary) hypertension: Secondary | ICD-10-CM

## 2020-12-17 NOTE — Chronic Care Management (AMB) (Signed)
Chronic Care Management Pharmacy Assistant   Name: Justin Ayala  MRN: [id number] DOB: 03/30/1951   Reason for Encounter: Disease State - General Adherence   Recent office visits:  09/18/20 Pricilla Holm, MD (PCP) - Routine General Exam - No medication changes, Follow up not indicated.  09/10/20 Pricilla Holm, MD (PCP) - Vasovagal Syncope - labs ordered, EKG done, no medication changes, follow up not indicated.   Recent consult visits:  10/28/20 Monna Fam - Ophthalmology- Cataract left eye - No notes available  09/24/20 [place] - No notes available  09/11/20 Monna Fam - Ophthalmology-- Cataract left eye - No notes available.  06/14/20 Quay Burow, MD - Cardiology - Hyperlipidemia - labs ordered, EKG done, no medication changes, follow up in 1 year.   05/16/2020 Irine Seal - Urology - Prostatic Hyperplasia - no notes available.  02/27/2020 Lynne Leader, MD - Sports Medicine - Chronic right sided low back pain -  no medication changes. No follow up indicated.   Hospital visits:  None in previous 6 months  Medications: Outpatient Encounter Medications as of 12/17/2020  Medication Sig   ASPIRIN ADULT LOW STRENGTH 81 MG EC tablet TAKE 1 BY MOUTH DAILY   Cholecalciferol (VITAMIN D-3) 1000 UNITS CAPS Take by mouth. Take one daily   Cranberry 125 MG TABS Take 1 tablet by mouth 2 (two) times a week.   ezetimibe (ZETIA) 10 MG tablet Take 1 tablet (10 mg total) by mouth daily.   ferrous sulfate 325 (65 FE) MG tablet Take 1 tablet (325 mg total) by mouth daily.   Glucosamine-Chondroitin (OSTEO BI-FLEX REGULAR STRENGTH PO) Take 1 tablet by mouth daily.   hydrochlorothiazide (HYDRODIURIL) 25 MG tablet Take 1 tablet (25 mg total) by mouth daily.   [name] 100 MG CAPS Take 1 capsule (100 mg total) by mouth daily.   lisinopril (ZESTRIL) 20 MG tablet Take 1 tablet (20 mg total) by mouth daily.   meloxicam (MOBIC) 7.5 MG tablet Take 1  tablet (7.5 mg total) by mouth daily.   metoprolol tartrate (LOPRESSOR) 25 MG tablet Take 1/2 (one half) tablet by mouth twice daily   pantoprazole (PROTONIX) 40 MG tablet TAKE 1 TABLET BY MOUTH ONCE DAILY . APPOINTMENT REQUIRED FOR FUTURE REFILLS   REPATHA PUSHTRONEX SYSTEM 420 MG/3.5ML SOCT INJECT 420 MG (3.5 MLS) INTO THE SKIN EVERY 30 DAYS   Resveratrol 100 MG CAPS Take 1 capsule by mouth daily.   tamsulosin (FLOMAX) 0.4 MG CAPS capsule Take 1 capsule (0.4 mg total) by mouth 2 (two) times daily.   No facility-administered encounter medications on file as of 12/17/2020.    Have you had any problems recently with your health? Patient denies any current problems with his health. Recently had cataract surgery and is doing well.   Have you had any problems with your pharmacy? Patient denies having any problems with his current pharmacy.  What issues or side effects are you having with your medications? Patient denies any issues with his current medication regimen. Reports he is only taking 1/2 lisinopril ('10mg'$ ) total which is why he has not gotten it refilled since 07/17/20.  What would you like me to pass along to [facility], CPP for them to help you with?  Patient states he has recently had cataract surgery and is doing well.   What can we do to take care of you better?  Patient does not have recommendation at this time.   Star Rating Drugs: lisinopril (  ZESTRIL) 20 MG tablet - last filled 07/17/20 60 days Patient reports he has been only taking 1/2 tablet daily which is what he states it has always been taking this way.    [name], [place] Pharmacist Assistant  [phone]  Time Spent: 20 mins

## 2020-12-18 ENCOUNTER — Other Ambulatory Visit: Payer: Self-pay | Admitting: Cardiovascular Disease

## 2020-12-20 NOTE — Telephone Encounter (Signed)
Patient has only been taking 1/2 lisinopril dose for some time. BP has been at goal with this.  Due to 1/2 dose he is failing Medicare quality metric for ACE/ARB adherence.  Consider changing dose to 10 mg or changing SIG to 1/2 tablet daily. Forwarding to prescriber Dr. Gwenlyn Found.

## 2020-12-23 MED ORDER — LISINOPRIL 20 MG PO TABS: 10.0000 mg | ORAL_TABLET | Freq: Every day | ORAL | 0 refills | Status: DC

## 2020-12-23 NOTE — Addendum Note (Signed)
Addended by: Charlton Haws on: 12/23/2020 04:57 PM   Modules accepted: Orders

## 2021-01-17 ENCOUNTER — Other Ambulatory Visit: Payer: Self-pay | Admitting: Internal Medicine

## 2021-02-02 ENCOUNTER — Other Ambulatory Visit: Payer: Self-pay | Admitting: Internal Medicine

## 2021-02-12 ENCOUNTER — Telehealth: Payer: Self-pay | Admitting: Pharmacist

## 2021-02-12 NOTE — Progress Notes (Signed)
    Chronic Care Management Pharmacy Assistant   Name: Justin Ayala  MRN: [id number] DOB: March 09, 1951   Reason for Encounter: Disease State   Conditions to be addressed/monitored: General    Recent office visits:  None ID  Recent consult visits:  None ID  Hospital visits:  None in previous 6 months  Medications: Outpatient Encounter Medications as of 02/12/2021  Medication Sig   ASPIRIN ADULT LOW STRENGTH 81 MG EC tablet TAKE 1 BY MOUTH DAILY   Cholecalciferol (VITAMIN D-3) 1000 UNITS CAPS Take by mouth. Take one daily   Cranberry 125 MG TABS Take 1 tablet by mouth 2 (two) times a week.   ezetimibe (ZETIA) 10 MG tablet Take 1 tablet by mouth once daily   ferrous sulfate 325 (65 FE) MG tablet Take 1 tablet (325 mg total) by mouth daily.   Glucosamine-Chondroitin (OSTEO BI-FLEX REGULAR STRENGTH PO) Take 1 tablet by mouth daily.   hydrochlorothiazide (HYDRODIURIL) 25 MG tablet Take 1 tablet (25 mg total) by mouth daily.   [name] 100 MG CAPS Take 1 capsule (100 mg total) by mouth daily.   lisinopril (ZESTRIL) 20 MG tablet Take 0.5 tablets (10 mg total) by mouth daily.   meloxicam (MOBIC) 7.5 MG tablet Take 1 tablet by mouth once daily   metoprolol tartrate (LOPRESSOR) 25 MG tablet Take 1/2 (one-half) tablet by mouth twice daily   pantoprazole (PROTONIX) 40 MG tablet TAKE 1 TABLET BY MOUTH ONCE DAILY . APPOINTMENT REQUIRED FOR FUTURE REFILLS   REPATHA PUSHTRONEX SYSTEM 420 MG/3.5ML SOCT INJECT 420 MG (3.5 MLS) INTO THE SKIN EVERY 30 DAYS   Resveratrol 100 MG CAPS Take 1 capsule by mouth daily.   tamsulosin (FLOMAX) 0.4 MG CAPS capsule Take 1 capsule (0.4 mg total) by mouth 2 (two) times daily.   No facility-administered encounter medications on file as of 02/12/2021.    Contacted Justin Ayala on 02/12/21, 02/17/21, 02/19/21 for general disease state and medication adherence call.   Patient is not > 5 days past due for refill on the following medications per chart  history:  Star Medications: Medication Name/mg Last Fill Days Supply Lisinopril 20 mg  12/23/20  30    Since last visit with CPP, no interventions have been made:   The patient has not had an ED visit since last contact.     Care Gaps: Annual wellness visit in last year? Yes Most Recent BP reading:  PCP appointment on 09/22/21    [place] Pharmacist Assistant [phone]   Time spent:22

## 2021-03-04 ENCOUNTER — Telehealth: Payer: Self-pay

## 2021-03-04 NOTE — Progress Notes (Signed)
    Chronic Care Management Pharmacy Assistant   Name: Justin Ayala  MRN: [id number] DOB: 03/10/1951   Reason for Encounter: Disease State   Conditions to be addressed/monitored: General    Recent office visits:  None ID  Recent consult visits:  None ID  Hospital visits:  None in previous 6 months  Medications: Outpatient Encounter Medications as of 03/04/2021  Medication Sig   ASPIRIN ADULT LOW STRENGTH 81 MG EC tablet TAKE 1 BY MOUTH DAILY   Cholecalciferol (VITAMIN D-3) 1000 UNITS CAPS Take by mouth. Take one daily   Cranberry 125 MG TABS Take 1 tablet by mouth 2 (two) times a week.   ezetimibe (ZETIA) 10 MG tablet Take 1 tablet by mouth once daily   ferrous sulfate 325 (65 FE) MG tablet Take 1 tablet (325 mg total) by mouth daily.   Glucosamine-Chondroitin (OSTEO BI-FLEX REGULAR STRENGTH PO) Take 1 tablet by mouth daily.   hydrochlorothiazide (HYDRODIURIL) 25 MG tablet Take 1 tablet (25 mg total) by mouth daily.   [name] 100 MG CAPS Take 1 capsule (100 mg total) by mouth daily.   lisinopril (ZESTRIL) 20 MG tablet Take 0.5 tablets (10 mg total) by mouth daily.   meloxicam (MOBIC) 7.5 MG tablet Take 1 tablet by mouth once daily   metoprolol tartrate (LOPRESSOR) 25 MG tablet Take 1/2 (one-half) tablet by mouth twice daily   pantoprazole (PROTONIX) 40 MG tablet TAKE 1 TABLET BY MOUTH ONCE DAILY . APPOINTMENT REQUIRED FOR FUTURE REFILLS   REPATHA PUSHTRONEX SYSTEM 420 MG/3.5ML SOCT INJECT 420 MG (3.5 MLS) INTO THE SKIN EVERY 30 DAYS   Resveratrol 100 MG CAPS Take 1 capsule by mouth daily.   tamsulosin (FLOMAX) 0.4 MG CAPS capsule Take 1 capsule (0.4 mg total) by mouth 2 (two) times daily.   No facility-administered encounter medications on file as of 03/04/2021.   Have you had any problems recently with your health?Patient states that he is not having any new health issues at this time  Have you had any problems with your pharmacy?Patient states that he does not  have any problems with getting his medications or the cost of his medications from the pharmacy  What issues or side effects are you having with your medications?Patient states he does not have any side effects from the pharmacy  What would you like me to pass along to [facility] for them to help you with? Patient states that he does not have any concerns aabut his health or meds at this time  What can we do to take care of you better? Patient states not at this time  Care Gaps: Colonoscopy-02/16/19 Diabetic Foot Exam-NA Mammogram-NA Ophthalmology-NA Dexa Scan - NA Annual Well Visit - NA Micro albumin-NA Hemoglobin A1c- NA  Star Rating Drugs: Lisinopril 20 mg-last fill 12/19/20 60 ds (patient states that he has some on hand)   [place] Pharmacist Assistant [phone]

## 2021-03-27 ENCOUNTER — Other Ambulatory Visit: Payer: Self-pay | Admitting: Cardiovascular Disease

## 2021-03-27 DIAGNOSIS — I10 Essential (primary) hypertension: Secondary | ICD-10-CM

## 2021-04-22 ENCOUNTER — Other Ambulatory Visit: Payer: Self-pay | Admitting: Internal Medicine

## 2021-05-09 ENCOUNTER — Telehealth: Payer: Self-pay

## 2021-05-09 NOTE — Telephone Encounter (Signed)
Patient called and requested help from [place] with renewing Repatha.  I informed him that Raquel no longer worked here and I would have our [place] assist on Monday.  He said that would be fine.

## 2021-05-12 NOTE — Telephone Encounter (Signed)
Was able to get his repatha healthwell grant approved. Will call pt later on at a more appropriate time today to send him the information. HEALTHWELL ID [id number]   PATIENT Justin Ayala   STATUS  Active   START DATE 05/08/2021   END DATE 05/07/2022   ASSISTANCE TYPE Co-pay   PAID $0.00   PENDING $0.00   BALANCE $2500.00 Pharmacy Card CARD NO. [id number]   CARD STATUS Active   BIN [id number]   PCN [id number]   PC GROUP [id number]   HELP DESK [phone]   PROVIDER [id number]   PROCESSOR

## 2021-05-12 NOTE — Telephone Encounter (Signed)
Called and spoke w/pt and stated that they were approved for [employer] and the information was emailed to the pt. Pt voiced understanding and gratitude.

## 2021-05-19 ENCOUNTER — Other Ambulatory Visit: Payer: Self-pay | Admitting: Internal Medicine

## 2021-06-17 ENCOUNTER — Ambulatory Visit: Payer: Medicare HMO | Admitting: Cardiovascular Disease

## 2021-06-17 ENCOUNTER — Encounter: Payer: Self-pay | Admitting: Cardiovascular Disease

## 2021-06-17 ENCOUNTER — Other Ambulatory Visit: Payer: Self-pay

## 2021-06-17 DIAGNOSIS — I779 Disorder of arteries and arterioles, unspecified: Secondary | ICD-10-CM | POA: Diagnosis not present

## 2021-06-17 DIAGNOSIS — I2510 Atherosclerotic heart disease of native coronary artery without angina pectoris: Secondary | ICD-10-CM | POA: Diagnosis not present

## 2021-06-17 DIAGNOSIS — E782 Mixed hyperlipidemia: Secondary | ICD-10-CM

## 2021-06-17 DIAGNOSIS — I6521 Occlusion and stenosis of right carotid artery: Secondary | ICD-10-CM

## 2021-06-17 DIAGNOSIS — I10 Essential (primary) hypertension: Secondary | ICD-10-CM

## 2021-06-17 NOTE — Assessment & Plan Note (Signed)
Mild right ICA stenosis by duplex ultrasound performed 09/20/2020.  This will be repeated on an annual basis.

## 2021-06-17 NOTE — Assessment & Plan Note (Addendum)
History of hyperlipidemia on Repatha, and Zetia, with lipid profile performed 06/14/2020 revealing total cholesterol of 117, LDL 51 and HDL 47, followed by his PCP.

## 2021-06-17 NOTE — Patient Instructions (Signed)
Medication Instructions:  Your physician recommends that you continue on your current medications as directed. Please refer to the Current Medication list given to you today.  *If you need a refill on your cardiac medications before your next appointment, please call your pharmacy*   Testing/Procedures: Your physician has requested that you have a carotid duplex. This test is an ultrasound of the carotid arteries in your neck. It looks at blood flow through these arteries that supply the brain with blood. Allow one hour for this exam. There are no restrictions or special instructions. To be done in April. This procedure is done at [facility].   Follow-Up: At [facility], you and your health needs are our priority.  As part of our continuing mission to provide you with exceptional heart care, we have created designated Provider Care Teams.  These Care Teams include your primary Cardiologist (physician) and Advanced Practice Providers (APPs -  Physician Assistants and Nurse Practitioners) who all work together to provide you with the care you need, when you need it.  We recommend signing up for the patient portal called "MyChart".  Sign up information is provided on this After Visit Summary.  MyChart is used to connect with patients for Virtual Visits (Telemedicine).  Patients are able to view lab/test results, encounter notes, upcoming appointments, etc.  Non-urgent messages can be sent to your provider as well.   To learn more about what you can do with MyChart, go to [email or web address].    Your next appointment:   12 month(s)  The format for your next appointment:   In Person  Provider:   Quay Burow, MD

## 2021-06-17 NOTE — Assessment & Plan Note (Signed)
History of noncritical CAD by cardiac catheterization which I performed 10/12/2008.  He had a 60 to 70% proximal first diagonal branch stenosis, and a 50% distal dominant circumflex stenosis in the PDA with normal LV function.  He denies chest pain or shortness of breath.

## 2021-06-17 NOTE — Assessment & Plan Note (Signed)
History of essential hypertension a blood pressure measured today 138/84.  He is on hydrochlorothiazide, lisinopril and metoprolol.

## 2021-06-17 NOTE — Progress Notes (Signed)
06/17/2021 Justin Ayala   [birth year]  [id number]  Primary Physician Hoyt Koch, MD Primary Cardiologist: Lorretta Harp MD [facility], [place], [place]  HPI:  Justin Ayala is a 71 y.o.  mildly overweight married African American male, father of 51, grandfather to 4 grandchildren, who I last saw office 06/14/2020... He has a history of moderate, but not critical, CAD by catheterization, which I performed Oct 12, 2008. He had a 60% to 70% proximal first diagonal branch stenosis. He had 50% distal dominant circumflex stenosis in the PDA with normal LV function. His other problems include hypertension and hyperlipidemia. He does not smoke. He has been exercising more recently. He has had a gastric polyp in the past, found in the setting of a GI workup for GI bleed. He was transfused at that time.his lipid profile followed by his primary care physician. He denies chest pain or shortness of breath. Since I saw him last he was complaining of lower extremity weakness which resolved after he stopped his Lipitor on his own. He did have elevated liver function tests as well followed by Dr. Benson Norway.  because of his statin intolerance and his elevated LDL of 166 measured on 09/29/16 he was begun on Repatha  which he is tolerating well.    Since I saw him a year ago he is remained completely asymptomatic denying chest pain or shortness of breath.     Current Meds  Medication Sig   ASPIRIN ADULT LOW STRENGTH 81 MG EC tablet TAKE 1 BY MOUTH DAILY   Cholecalciferol (VITAMIN D-3) 1000 UNITS CAPS Take by mouth. Take one daily   Cranberry 125 MG TABS Take 1 tablet by mouth 2 (two) times a week.   ezetimibe (ZETIA) 10 MG tablet Take 1 tablet by mouth once daily   ferrous sulfate 325 (65 FE) MG tablet Take 1 tablet (325 mg total) by mouth daily.   Glucosamine-Chondroitin (OSTEO BI-FLEX REGULAR STRENGTH PO) Take 1 tablet by mouth daily.   hydrochlorothiazide (HYDRODIURIL) 25 MG tablet Take 1 tablet by mouth  once daily   [name] 100 MG CAPS Take 1 capsule (100 mg total) by mouth daily.   lisinopril (ZESTRIL) 20 MG tablet Take 1 tablet by mouth once daily   meloxicam (MOBIC) 7.5 MG tablet Take 1 tablet by mouth once daily   metoprolol tartrate (LOPRESSOR) 25 MG tablet Take 1/2 (one-half) tablet by mouth twice daily   pantoprazole (PROTONIX) 40 MG tablet TAKE 1 TABLET BY MOUTH ONCE DAILY . APPOINTMENT REQUIRED FOR FUTURE REFILLS   REPATHA PUSHTRONEX SYSTEM 420 MG/3.5ML SOCT INJECT 420 MG (3.5 MLS) INTO THE SKIN EVERY 30 DAYS   Resveratrol 100 MG CAPS Take 1 capsule by mouth daily.   tamsulosin (FLOMAX) 0.4 MG CAPS capsule Take 1 capsule (0.4 mg total) by mouth 2 (two) times daily.     Allergies  Allergen Reactions   Ace Inhibitors Cough    Social History   Socioeconomic History   Marital status: Married    Spouse name: Not on file   Number of children: 3   Years of education: BS    Highest education level: Not on file  Occupational History    Employer: [employer]  Tobacco Use   Smoking status: Never   Smokeless tobacco: Never  Substance and Sexual Activity   Alcohol use: No   Drug use: No   Sexual activity: Yes    Partners: Female  Other Topics Concern   Not on file  Social History Narrative   married x 30+years; 3 children, 4 grandsons    no smoking or ETOH;    Retired Merchant navy officer formerly at [employer];    likes to golf (golfs 3-4 times a week); no exercise and rides cart while golfing         Social Determinants of Radio broadcast assistant Strain: Not on file  Food Insecurity: Not on file  Transportation Needs: Not on file  Physical Activity: Not on file  Stress: Not on file  Social Connections: Not on file  Intimate Partner Violence: Not on file     Review of Systems: General: negative for chills, fever, night sweats or weight changes.  Cardiovascular: negative for chest pain, dyspnea on exertion, edema, orthopnea, palpitations, paroxysmal  nocturnal dyspnea or shortness of breath Dermatological: negative for rash Respiratory: negative for cough or wheezing Urologic: negative for hematuria Abdominal: negative for nausea, vomiting, diarrhea, bright red blood per rectum, melena, or hematemesis Neurologic: negative for visual changes, syncope, or dizziness All other systems reviewed and are otherwise negative except as noted above.    Blood pressure 138/84, pulse 66, height 5\' 7"  (1.702 m), weight 218 lb (98.9 kg), SpO2 96 %.  General appearance: alert and no distress Neck: no adenopathy, no carotid bruit, no JVD, supple, symmetrical, trachea midline, and thyroid not enlarged, symmetric, no tenderness/mass/nodules Lungs: clear to auscultation bilaterally Heart: regular rate and rhythm, S1, S2 normal, no murmur, click, rub or gallop Extremities: extremities normal, atraumatic, no cyanosis or edema Pulses: 2+ and symmetric Skin: Skin color, texture, turgor normal. No rashes or lesions Neurologic: Grossly normal  EKG sinus rhythm at 66 with T wave inversion in leads III and F, and biphasic T waves in the lateral precordial leads.  I personally reviewed this EKG.  ASSESSMENT AND PLAN:   Hyperlipidemia History of hyperlipidemia on Repatha, and Zetia, with lipid profile performed 06/14/2020 revealing total cholesterol of 117, LDL 51 and HDL 47, followed by his PCP.  HYPERTENSION, BENIGN SYSTEMIC History of essential hypertension a blood pressure measured today 138/84.  He is on hydrochlorothiazide, lisinopril and metoprolol.  CAD (coronary artery disease) History of noncritical CAD by cardiac catheterization which I performed 10/12/2008.  He had a 60 to 70% proximal first diagonal branch stenosis, and a 50% distal dominant circumflex stenosis in the PDA with normal LV function.  He denies chest pain or shortness of breath.  Carotid artery disease (HCC) Mild right ICA stenosis by duplex ultrasound performed 09/20/2020.  This will  be repeated on an annual basis.     Lorretta Harp MD FACP,FACC,FAHA, [facility] 06/17/2021 10:16 AM

## 2021-06-20 ENCOUNTER — Telehealth: Payer: Self-pay

## 2021-06-20 NOTE — Progress Notes (Signed)
° ° °  Chronic Care Management Pharmacy Assistant   Name: Justin Ayala  MRN: [id number] DOB: 11-Jul-1950   Reason for Encounter: Disease State-General    Recent office visits:  None ID  Recent consult visits:  06/17/21 Lorretta Harp, MD-Cardiology (Mixed hyperlipidemia) No med changes  Hospital visits:  None in previous 6 months  Medications: Outpatient Encounter Medications as of 06/20/2021  Medication Sig   ASPIRIN ADULT LOW STRENGTH 81 MG EC tablet TAKE 1 BY MOUTH DAILY   Cholecalciferol (VITAMIN D-3) 1000 UNITS CAPS Take by mouth. Take one daily   Cranberry 125 MG TABS Take 1 tablet by mouth 2 (two) times a week.   ezetimibe (ZETIA) 10 MG tablet Take 1 tablet by mouth once daily   ferrous sulfate 325 (65 FE) MG tablet Take 1 tablet (325 mg total) by mouth daily.   Glucosamine-Chondroitin (OSTEO BI-FLEX REGULAR STRENGTH PO) Take 1 tablet by mouth daily.   hydrochlorothiazide (HYDRODIURIL) 25 MG tablet Take 1 tablet by mouth once daily   [name] 100 MG CAPS Take 1 capsule (100 mg total) by mouth daily.   lisinopril (ZESTRIL) 20 MG tablet Take 1 tablet by mouth once daily   meloxicam (MOBIC) 7.5 MG tablet Take 1 tablet by mouth once daily   metoprolol tartrate (LOPRESSOR) 25 MG tablet Take 1/2 (one-half) tablet by mouth twice daily   pantoprazole (PROTONIX) 40 MG tablet TAKE 1 TABLET BY MOUTH ONCE DAILY . APPOINTMENT REQUIRED FOR FUTURE REFILLS   REPATHA PUSHTRONEX SYSTEM 420 MG/3.5ML SOCT INJECT 420 MG (3.5 MLS) INTO THE SKIN EVERY 30 DAYS   Resveratrol 100 MG CAPS Take 1 capsule by mouth daily.   tamsulosin (FLOMAX) 0.4 MG CAPS capsule Take 1 capsule (0.4 mg total) by mouth 2 (two) times daily.   No facility-administered encounter medications on file as of 06/20/2021.   Have you had any problems recently with your health?Patient states that he is not having any new health issues  Have you had any problems with your pharmacy?Patient states that he is not having any  problems with getting medications or the cost of medications from the pharmacy  What issues or side effects are you having with your medications?Patient states he is not having any side effects from medications  What would you like me to pass along to [facility] for them to help you with? Patient states that he is doing well and does not have any concerns about his health or medications  What can we do to take care of you better?Patient states that he does not need anything at this time   Care Gaps: Colonoscopy-02/16/19 Diabetic Foot Exam-NA Ophthalmology-NA Dexa Scan - NA Annual Well Visit - 09/18/20 Micro albumin-NA Hemoglobin A1c- NA  Star Rating Drugs: Lisinopril 20 mg-last fill 03/27/21 60 ds  [place] Pharmacist Assistant [phone]

## 2021-06-27 DIAGNOSIS — H26493 Other secondary cataract, bilateral: Secondary | ICD-10-CM | POA: Diagnosis not present

## 2021-06-27 DIAGNOSIS — H43392 Other vitreous opacities, left eye: Secondary | ICD-10-CM | POA: Diagnosis not present

## 2021-06-27 DIAGNOSIS — Z961 Presence of intraocular lens: Secondary | ICD-10-CM | POA: Diagnosis not present

## 2021-06-27 DIAGNOSIS — H40023 Open angle with borderline findings, high risk, bilateral: Secondary | ICD-10-CM | POA: Diagnosis not present

## 2021-07-07 ENCOUNTER — Other Ambulatory Visit: Payer: Self-pay | Admitting: Internal Medicine

## 2021-07-22 ENCOUNTER — Other Ambulatory Visit: Payer: Self-pay | Admitting: Internal Medicine

## 2021-08-01 ENCOUNTER — Encounter: Payer: Self-pay | Admitting: Internal Medicine

## 2021-08-01 ENCOUNTER — Ambulatory Visit (INDEPENDENT_AMBULATORY_CARE_PROVIDER_SITE_OTHER): Payer: Medicare HMO | Admitting: Internal Medicine

## 2021-08-01 ENCOUNTER — Other Ambulatory Visit: Payer: Self-pay

## 2021-08-01 DIAGNOSIS — J069 Acute upper respiratory infection, unspecified: Secondary | ICD-10-CM | POA: Insufficient documentation

## 2021-08-01 DIAGNOSIS — I10 Essential (primary) hypertension: Secondary | ICD-10-CM | POA: Diagnosis not present

## 2021-08-01 MED ORDER — PROMETHAZINE-DM 6.25-15 MG/5ML PO SYRP: 5.0000 mL | ORAL_SOLUTION | Freq: Four times a day (QID) | ORAL | 0 refills | Status: DC | PRN

## 2021-08-01 MED ORDER — PREDNISONE 20 MG PO TABS: 40.0000 mg | ORAL_TABLET | Freq: Every day | ORAL | 0 refills | Status: AC

## 2021-08-01 NOTE — Assessment & Plan Note (Signed)
Mildly uncontrolled today likely due to dayquil the last few days. Previously at goal so will monitor on same medications HCTZ, lisinopril and metoprolol.  ?

## 2021-08-01 NOTE — Progress Notes (Signed)
? ?  Subjective:  ? ?Patient ID: Justin Ayala, male    DOB: Nov 21, 1950, 71 y.o.   MRN: [id number] ? ?HPI ?The patient is a 71 YO man coming in for cough. ? ?Review of Systems  ?Constitutional:  Negative for activity change, appetite change, chills, fatigue, fever and unexpected weight change.  ?HENT:  Positive for congestion, postnasal drip, rhinorrhea and sinus pressure. Negative for ear discharge, ear pain, sinus pain, sneezing, sore throat, tinnitus, trouble swallowing and voice change.   ?Eyes: Negative.   ?Respiratory:  Positive for cough. Negative for chest tightness, shortness of breath and wheezing.   ?Cardiovascular: Negative.   ?Gastrointestinal: Negative.   ?Musculoskeletal:  Positive for myalgias.  ?Neurological: Negative.   ? ?Objective:  ?Physical Exam ?Constitutional:   ?   Appearance: He is well-developed.  ?HENT:  ?   Head: Normocephalic and atraumatic.  ?   Comments: Oropharynx with redness and clear drainage, nose with swollen turbinates, TMs normal bilaterally.  ?Neck:  ?   Thyroid: No thyromegaly.  ?Cardiovascular:  ?   Rate and Rhythm: Normal rate and regular rhythm.  ?Pulmonary:  ?   Effort: Pulmonary effort is normal. No respiratory distress.  ?   Breath sounds: Normal breath sounds. No wheezing or rales.  ?Abdominal:  ?   Palpations: Abdomen is soft.  ?Musculoskeletal:     ?   General: Tenderness present.  ?   Cervical back: Normal range of motion.  ?Lymphadenopathy:  ?   Cervical: No cervical adenopathy.  ?Skin: ?   General: Skin is warm and dry.  ?Neurological:  ?   Mental Status: He is alert and oriented to person, place, and time.  ? ? ?Vitals:  ? 08/01/21 1527  ?BP: (!) 142/86  ?Pulse: 81  ?Temp: 98.6 ?F (37 ?C)  ?TempSrc: Oral  ?SpO2: 96%  ?Weight: 215 lb 8 oz (97.8 kg)  ?Height: '5\' 7"'$  (1.702 m)  ? ? ?This visit occurred during the SARS-CoV-2 public health emergency.  Safety protocols were in place, including screening questions prior to the visit, additional usage of staff PPE, and  extensive cleaning of exam room while observing appropriate contact time as indicated for disinfecting solutions.  ? ?Assessment & Plan:  ? ?

## 2021-08-01 NOTE — Assessment & Plan Note (Signed)
Rx prednisone for congestion. Rx promethazine/dm cough syrup to help with cough. Advised that high BP is likely due to dayquil.  ?

## 2021-08-01 NOTE — Patient Instructions (Signed)
We have sent in prednisone to take 2 pills daily for 4 days. ? ?We have sent in a stronger cough medicine to use up to 4 times a day if needed.  ? ?It is okay to take dayquil as well. ? ? ?

## 2021-08-05 ENCOUNTER — Telehealth: Payer: Self-pay | Admitting: Internal Medicine

## 2021-08-05 NOTE — Telephone Encounter (Signed)
Patient calling in ? ?Patient says provider prescribed him 2 new meds predniSONE (DELTASONE) 20 MG tablet ?promethazine-dextromethorphan (PROMETHAZINE-DM) 6.25-15 MG/5ML syrup ? ?Says since taking new meds he has noticed an increase in his bp.. but he is not sure which 1 of the meds is causing this ? ?Says he only has 1 tabket left of the prednisone ? ?Please fu [phone] ?

## 2021-08-06 NOTE — Telephone Encounter (Signed)
Called pt to see if he has been having any symptoms of high BP, pt states he has not had any of the symptoms listed. As of this afternoon he states his BP is 133/80 and so he will just continue to monitor but agreed to schedule an OV if symptoms start and/or BP continues to rise. ?

## 2021-08-06 NOTE — Telephone Encounter (Signed)
Called pt to follow up on his elevated BP readings. Pt states he thought it was the new medications causing the elevated readings so he decided to not take them yesterday. His readings are still running high this morning so he is unsure if medications could be causing this but wants to make sure with the doctor. ? ?Yesterday morning 158/100 ?Yesterday night it went down to normal 117/80 ?This morning 174/100  ?

## 2021-08-06 NOTE — Telephone Encounter (Signed)
Is he having any symptoms of the high BP I.e. headache, chest pains, nausea. If so should have visit. If not okay to monitor over the next few days for this to get back to normal. ?

## 2021-08-08 ENCOUNTER — Other Ambulatory Visit: Payer: Self-pay | Admitting: Internal Medicine

## 2021-08-08 ENCOUNTER — Other Ambulatory Visit: Payer: Self-pay | Admitting: Cardiovascular Disease

## 2021-08-08 DIAGNOSIS — I10 Essential (primary) hypertension: Secondary | ICD-10-CM

## 2021-08-25 ENCOUNTER — Ambulatory Visit (HOSPITAL_COMMUNITY): Payer: Medicare HMO

## 2021-08-25 ENCOUNTER — Encounter (HOSPITAL_COMMUNITY): Payer: Self-pay

## 2021-08-25 ENCOUNTER — Emergency Department (HOSPITAL_COMMUNITY)
Admission: EM | Admit: 2021-08-25 | Discharge: 2021-08-25 | Disposition: A | Discharge status: Home / Self Care | Payer: Medicare HMO | Attending: Emergency Medicine | Admitting: Emergency Medicine

## 2021-08-25 ENCOUNTER — Ambulatory Visit
Admission: EM | Admit: 2021-08-25 | Discharge: 2021-08-25 | Disposition: A | Discharge status: Other Acute Inpatient Hospital | Payer: Medicare HMO | Source: Home / Self Care

## 2021-08-25 ENCOUNTER — Other Ambulatory Visit: Payer: Self-pay

## 2021-08-25 ENCOUNTER — Encounter: Payer: Self-pay | Admitting: Cardiovascular Disease

## 2021-08-25 ENCOUNTER — Telehealth: Payer: Self-pay | Admitting: Cardiovascular Disease

## 2021-08-25 DIAGNOSIS — Z79899 Other long term (current) drug therapy: Secondary | ICD-10-CM | POA: Insufficient documentation

## 2021-08-25 DIAGNOSIS — I10 Essential (primary) hypertension: Secondary | ICD-10-CM | POA: Insufficient documentation

## 2021-08-25 DIAGNOSIS — R059 Cough, unspecified: Secondary | ICD-10-CM | POA: Insufficient documentation

## 2021-08-25 DIAGNOSIS — I160 Hypertensive urgency: Secondary | ICD-10-CM | POA: Insufficient documentation

## 2021-08-25 DIAGNOSIS — Z7982 Long term (current) use of aspirin: Secondary | ICD-10-CM | POA: Diagnosis not present

## 2021-08-25 DIAGNOSIS — R9431 Abnormal electrocardiogram [ECG] [EKG]: Secondary | ICD-10-CM | POA: Insufficient documentation

## 2021-08-25 DIAGNOSIS — R600 Localized edema: Secondary | ICD-10-CM | POA: Diagnosis not present

## 2021-08-25 LAB — CBC WITH DIFFERENTIAL/PLATELET
Abs Immature Granulocytes: 0.01 10*3/uL (ref 0.00–0.07)
Basophils Absolute: 0 10*3/uL (ref 0.0–0.1)
Basophils Relative: 1 %
Eosinophils Absolute: 0.4 10*3/uL (ref 0.0–0.5)
Eosinophils Relative: 8 %
HCT: 40.6 % (ref 39.0–52.0)
Hemoglobin: 13.2 g/dL (ref 13.0–17.0)
Immature Granulocytes: 0 %
Lymphocytes Relative: 29 %
Lymphs Abs: 1.6 10*3/uL (ref 0.7–4.0)
MCH: 28.2 pg (ref 26.0–34.0)
MCHC: 32.5 g/dL (ref 30.0–36.0)
MCV: 86.8 fL (ref 80.0–100.0)
Monocytes Absolute: 0.5 10*3/uL (ref 0.1–1.0)
Monocytes Relative: 10 %
Neutro Abs: 2.9 10*3/uL (ref 1.7–7.7)
Neutrophils Relative %: 52 %
Platelets: 256 10*3/uL (ref 150–400)
RBC: 4.68 MIL/uL (ref 4.22–5.81)
RDW: 12.9 % (ref 11.5–15.5)
WBC: 5.4 10*3/uL (ref 4.0–10.5)
nRBC: 0 % (ref 0.0–0.2)

## 2021-08-25 LAB — COMPREHENSIVE METABOLIC PANEL
ALT: 19 U/L (ref 0–44)
AST: 20 U/L (ref 15–41)
Albumin: 3.6 g/dL (ref 3.5–5.0)
Alkaline Phosphatase: 71 U/L (ref 38–126)
Anion gap: 7 (ref 5–15)
BUN: 14 mg/dL (ref 8–23)
CO2: 25 mmol/L (ref 22–32)
Calcium: 8.9 mg/dL (ref 8.9–10.3)
Chloride: 108 mmol/L (ref 98–111)
Creatinine, Ser: 1.21 mg/dL (ref 0.61–1.24)
GFR, Estimated: >60 mL/min (ref >60)
Glucose, Bld: 85 mg/dL (ref 70–99)
Potassium: 3.8 mmol/L (ref 3.5–5.1)
Sodium: 140 mmol/L (ref 135–145)
Total Bilirubin: 0.7 mg/dL (ref 0.3–1.2)
Total Protein: 7.3 g/dL (ref 6.5–8.1)

## 2021-08-25 LAB — URINALYSIS, ROUTINE W REFLEX MICROSCOPIC
Bilirubin Urine: NEGATIVE
Glucose, UA: NEGATIVE mg/dL
Hgb urine dipstick: NEGATIVE
Ketones, ur: NEGATIVE mg/dL
Leukocytes,Ua: NEGATIVE
Nitrite: NEGATIVE
Protein, ur: NEGATIVE mg/dL
Specific Gravity, Urine: 1.024 (ref 1.005–1.030)
pH: 5 (ref 5.0–8.0)

## 2021-08-25 NOTE — ED Provider Notes (Addendum)
[facility]    CSN: [id number] Arrival date & time: 08/25/21  1222      History   Chief Complaint Chief Complaint  Patient presents with   Hypertension    HPI Nkrumah Hackert is a 71 y.o. male.   Patient presents with concerns for high blood pressure.  Patient reports that he got a new blood pressure machine so he has been taking his blood pressure over the past few days.  His blood pressure has been ranging from 120s to 200s systolic.  Last blood pressure that he took this morning was 200 systolic.  Denies any associated chest pain, shortness of breath, headache, dizziness, blurred vision, nausea, vomiting.  Patient currently takes metoprolol, lisinopril, hydrochlorothiazide.  Followed by cardiology and PCP for management of blood pressure.  Last saw cardiology in January of this year.  He notified cardiology today but has not heard back yet.   Hypertension   Past Medical History:  Diagnosis Date   Allergic rhinitis    Anemia 2010   CAD (coronary artery disease)    60-70% prox 1st diagonal branch stenosis, 50% dominant circumflex stenosis in PDA, normal LV function (by 10/12/2008 cath)   GERD (gastroesophageal reflux disease)    History of cardiovascular stress test 10/02/2008   exercise tolerance test - abnormal test - subsequent cath    HLD (hyperlipidemia)    HTN (hypertension)    Kidney stones    Overweight(278.02)    Peptic ulcer    Prostate cancer [facility])     Patient Active Problem List   Diagnosis Date Noted   URI (upper respiratory infection) 08/01/2021   Carotid artery disease (HCC) 06/17/2021   Vasovagal syncope 09/11/2020   Back pain 09/18/2019   Routine general medical examination at a health care facility 10/24/2018   GERD (gastroesophageal reflux disease) 09/03/2017   Inguinal hernia 10/18/2015   Fatigue 10/10/2013   Erectile dysfunction 12/20/2012   H/O prostate cancer 04/27/2011    Class: Stage 1   CAD (coronary artery disease)    Kidney  stones    Adrenal cyst (HCC) 02/04/2011   Overweight 02/03/2008   Hyperlipidemia 07/22/2006   HYPERTENSION, BENIGN SYSTEMIC 07/22/2006    Past Surgical History:  Procedure Laterality Date   APPENDECTOMY  12/2008   Dr. Gordy Savers   BIOPSY PROSTATE  03/19/11   gleason 3+3=6, volume 34 cc, psa 01/06/11 5.00   CARDIAC CATHETERIZATION  2010   non-critical stenosis   FLEXIBLE SIGMOIDOSCOPY N/A 08/18/2013   Procedure: FLEXIBLE SIGMOIDOSCOPY;  Surgeon: Theda Belfast, MD;  Location: [facility];  Service: Endoscopy;  Laterality: N/A;   HOT HEMOSTASIS N/A 08/18/2013   Procedure: HOT HEMOSTASIS (ARGON PLASMA COAGULATION/BICAP);  Surgeon: Theda Belfast, MD;  Location: [facility];  Service: Endoscopy;  Laterality: N/A;  AVM - sig colon   RADIOACTIVE SEED IMPLANT  08/14/2011   Procedure: RADIOACTIVE SEED IMPLANT;  Surgeon: Garnett Farm, MD;  Location: [facility];  Service: Urology;  Laterality: N/A;  84  SEEDS IMPLANTED IN PROSTATE   TONSILLECTOMY         Home Medications    Prior to Admission medications   Medication Sig Start Date End Date Taking? Authorizing Provider  ASPIRIN ADULT LOW STRENGTH 81 MG EC tablet TAKE 1 BY MOUTH DAILY 05/16/15   Myrlene Broker, MD  Cholecalciferol (VITAMIN D-3) 1000 UNITS CAPS Take by mouth. Take one daily    [provider]  Cranberry 125 MG TABS Take 1 tablet by  mouth 2 (two) times a week.    [provider]  ezetimibe (ZETIA) 10 MG tablet Take 1 tablet by mouth once daily 07/08/21   Etta Grandchild, MD  ferrous sulfate 325 (65 FE) MG tablet Take 1 tablet (325 mg total) by mouth daily. 05/11/14   Narda Bonds, MD  Glucosamine-Chondroitin (OSTEO BI-FLEX REGULAR STRENGTH PO) Take 1 tablet by mouth daily.    [provider]  hydrochlorothiazide (HYDRODIURIL) 25 MG tablet Take 1 tablet by mouth once daily 08/08/21   Myrlene Broker, MD  [place] Ginseng 100 MG CAPS Take 1 capsule (100 mg total) by mouth  daily. 05/11/14   Narda Bonds, MD  lisinopril (ZESTRIL) 20 MG tablet Take 1 tablet by mouth once daily 08/08/21   Runell Gess, MD  meloxicam [facility]) 7.5 MG tablet Take 1 tablet by mouth once daily 08/08/21   Myrlene Broker, MD  metoprolol tartrate (LOPRESSOR) 25 MG tablet Take 1/2 (one-half) tablet by mouth twice daily 07/22/21   Myrlene Broker, MD  pantoprazole (PROTONIX) 40 MG tablet TAKE 1 TABLET BY MOUTH ONCE DAILY . APPOINTMENT REQUIRED FOR FUTURE REFILLS 09/23/20   Myrlene Broker, MD  promethazine-dextromethorphan (PROMETHAZINE-DM) 6.25-15 MG/5ML syrup Take 5 mLs by mouth 4 (four) times daily as needed for cough. 08/01/21   Myrlene Broker, MD  REPATHA PUSHTRONEX SYSTEM 420 MG/3.5ML SOCT INJECT 420 MG (3.5 MLS) INTO THE SKIN EVERY 30 DAYS 07/22/20   Runell Gess, MD  Resveratrol 100 MG CAPS Take 1 capsule by mouth daily.    [provider]  tamsulosin (FLOMAX) 0.4 MG CAPS capsule Take 1 capsule by mouth twice daily 07/08/21   Etta Grandchild, MD    Family History Family History  Problem Relation Age of Onset   Hypertension Father        also organ failure   Stroke Father    Hypertension Mother    Cervical cancer Mother    Prostate cancer Brother        seed implant   Heart failure Paternal Grandmother    Heart failure Paternal Grandfather    Diabetes Neg Hx     Social History Social History   Tobacco Use   Smoking status: Never   Smokeless tobacco: Never  Substance Use Topics   Alcohol use: No   Drug use: No     Allergies   Ace inhibitors   Review of Systems Review of Systems Per HPI  Physical Exam Triage Vital Signs ED Triage Vitals  Enc Vitals Group     BP 08/25/21 1355 (S) (!) 177/114     Pulse Rate 08/25/21 1355 64     Resp 08/25/21 1355 16     Temp 08/25/21 1355 98.1 F (36.7 C)     Temp Source 08/25/21 1355 Oral     SpO2 08/25/21 1355 95 %     Weight --      Height --      Head Circumference --       Peak Flow --      Pain Score 08/25/21 1354 0     Pain Loc --      Pain Edu? --      Excl. in GC? --    No data found.  Updated Vital Signs BP (S) (!) 177/114 (BP Location: Left Arm)   Pulse 64   Temp 98.1 F (36.7 C) (Oral)   Resp 16   SpO2 95%   Visual  Acuity Right Eye Distance:   Left Eye Distance:   Bilateral Distance:    Right Eye Near:   Left Eye Near:    Bilateral Near:     Physical Exam Constitutional:      General: He is not in acute distress.    Appearance: Normal appearance. He is not toxic-appearing or diaphoretic.  HENT:     Head: Normocephalic and atraumatic.  Eyes:     Extraocular Movements: Extraocular movements intact.     Conjunctiva/sclera: Conjunctivae normal.     Pupils: Pupils are equal, round, and reactive to light.  Cardiovascular:     Rate and Rhythm: Normal rate and regular rhythm.     Pulses: Normal pulses.     Heart sounds: Normal heart sounds.  Pulmonary:     Effort: Pulmonary effort is normal. No respiratory distress.     Breath sounds: Normal breath sounds.  Skin:    General: Skin is warm and dry.  Neurological:     General: No focal deficit present.     Mental Status: He is alert and oriented to person, place, and time. Mental status is at baseline.     Cranial Nerves: Cranial nerves 2-12 are intact.     Sensory: Sensation is intact.     Motor: Motor function is intact.     Coordination: Coordination is intact.     Gait: Gait is intact.  Psychiatric:        Mood and Affect: Mood normal.        Behavior: Behavior normal.        Thought Content: Thought content normal.        Judgment: Judgment normal.     UC Treatments / Results  Labs (all labs ordered are listed, but only abnormal results are displayed) Labs Reviewed - No data to display  EKG   Radiology No results found.  Procedures Procedures (including critical care time)  Medications Ordered in UC Medications - No data to display  Initial Impression /  Assessment and Plan / UC Course  I have reviewed the triage vital signs and the nursing notes.  Pertinent labs & imaging results that were available during my care of the patient were reviewed by me and considered in my medical decision making (see chart for details).     Blood pressure 170s systolic in urgent care.  EKG was completed to determine if irregular heart rhythm was related to large fluctuations and variations in blood pressure.  It did show irregularities which was a change from previous EKGs when compared.  Due to change in EKG and associated elevated blood pressure, patient was advised that he will need to go to the hospital for further evaluation and management.  Dr. Allyson Sabal, patient's cardiologist, was notified of events today as well.  Patient left via self transport as he was stable at discharge. Final Clinical Impressions(s) / UC Diagnoses   Final diagnoses:  Nonspecific abnormal electrocardiogram (ECG) (EKG)  Hypertensive urgency     Discharge Instructions      Please go to the emergency department as soon as you leave urgent care for further evaluation and management.    ED Prescriptions   None    PDMP not reviewed this encounter.   Gustavus Bryant, [place] 08/25/21 1512    Gustavus Bryant, [place] 08/25/21 [phone]

## 2021-08-25 NOTE — ED Provider Notes (Signed)
?[facility] ?Provider Note ? ? ?CSN: [id number] ?Arrival date & time: 08/25/21  1601 ? ?  ? ?History ? ?Chief Complaint  ?Patient presents with  ? Hypertension  ? ? ?Justin Ayala is a 71 y.o. male with history of hypertension presenting to the ED for elevated blood pressures.  Over the last day patient has had fluctuations in his pressures from 601U to 932T systolic.  He takes his blood pressure is 99 was 200s so he came to urgent care to be evaluated.  He called his cardiologist but they did not provide recommendations yet.  He takes his metoprolol, lisinopril, and HCTZ as prescribed.  He denies any symptoms.  He denies chest pain, shortness of breath, headache, dizziness, blurred vision, nausea, vomiting.  He does report 2 weeks ago he had an episode after coughing he was concerned he may have blacked out has not had issues at all since that time. ? ? ?Hypertension ? ? ?  ? ?Home Medications ?Prior to Admission medications   ?Medication Sig Start Date End Date Taking? Authorizing Provider  ?ASPIRIN ADULT LOW STRENGTH 81 MG EC tablet TAKE 1 BY MOUTH DAILY 05/16/15   Hoyt Koch, MD  ?Cholecalciferol (VITAMIN D-3) 1000 UNITS CAPS Take by mouth. Take one daily    [provider]  ?Cranberry 125 MG TABS Take 1 tablet by mouth 2 (two) times a week.    [provider]  ?ezetimibe (ZETIA) 10 MG tablet Take 1 tablet by mouth once daily 07/08/21   Janith Lima, MD  ?ferrous sulfate 325 (65 FE) MG tablet Take 1 tablet (325 mg total) by mouth daily. 05/11/14   Mariel Aloe, MD  ?Glucosamine-Chondroitin (OSTEO BI-FLEX REGULAR STRENGTH PO) Take 1 tablet by mouth daily.    [provider]  ?hydrochlorothiazide (HYDRODIURIL) 25 MG tablet Take 1 tablet by mouth once daily 08/08/21   Hoyt Koch, MD  ?[name] 100 MG CAPS Take 1 capsule (100 mg total) by mouth daily. 05/11/14   Mariel Aloe, MD  ?lisinopril (ZESTRIL) 20 MG tablet Take  1 tablet by mouth once daily 08/08/21   Lorretta Harp, MD  ?meloxicam [facility]) 7.5 MG tablet Take 1 tablet by mouth once daily 08/08/21   Hoyt Koch, MD  ?metoprolol tartrate (LOPRESSOR) 25 MG tablet Take 1/2 (one-half) tablet by mouth twice daily 07/22/21   Hoyt Koch, MD  ?pantoprazole (PROTONIX) 40 MG tablet TAKE 1 TABLET BY MOUTH ONCE DAILY . APPOINTMENT REQUIRED FOR FUTURE REFILLS 09/23/20   Hoyt Koch, MD  ?promethazine-dextromethorphan (PROMETHAZINE-DM) 6.25-15 MG/5ML syrup Take 5 mLs by mouth 4 (four) times daily as needed for cough. 08/01/21   Hoyt Koch, MD  ?[place] 420 MG/3.5ML SOCT INJECT 420 MG (3.5 MLS) INTO THE SKIN EVERY 30 DAYS 07/22/20   Lorretta Harp, MD  ?Resveratrol 100 MG CAPS Take 1 capsule by mouth daily.    [provider]  ?tamsulosin (FLOMAX) 0.4 MG CAPS capsule Take 1 capsule by mouth twice daily 07/08/21   Janith Lima, MD  ?   ? ?Allergies    ?Ace inhibitors   ? ?Review of Systems   ?Review of Systems ? ?Physical Exam ?Updated Vital Signs ?BP (!) 152/89   Pulse (!) 59   Temp 97.6 ?F (36.4 ?C) (Oral)   Resp 19   SpO2 95%  ?Physical Exam ?Vitals and nursing note reviewed.  ?Constitutional:   ?   General:  He is not in acute distress. ?   Appearance: He is well-developed.  ?HENT:  ?   Head: Normocephalic and atraumatic.  ?Eyes:  ?   Conjunctiva/sclera: Conjunctivae normal.  ?Cardiovascular:  ?   Rate and Rhythm: Normal rate and regular rhythm.  ?   Heart sounds: No murmur heard. ?Pulmonary:  ?   Effort: Pulmonary effort is normal. No respiratory distress.  ?   Breath sounds: Normal breath sounds.  ?Abdominal:  ?   Palpations: Abdomen is soft.  ?   Tenderness: There is no abdominal tenderness.  ?Musculoskeletal:     ?   General: No swelling.  ?   Cervical back: Neck supple.  ?   Right lower leg: Edema present.  ?   Left lower leg: Edema present.  ?   Comments: Minimal BLE pitting edema  ?Skin: ?   General: Skin is  warm and dry.  ?   Capillary Refill: Capillary refill takes less than 2 seconds.  ?Neurological:  ?   General: No focal deficit present.  ?   Mental Status: He is alert and oriented to person, place, and time.  ?Psychiatric:     ?   Mood and Affect: Mood normal.     ?   Behavior: Behavior normal.  ? ? ?ED Results / Procedures / Treatments   ?Labs ?(all labs ordered are listed, but only abnormal results are displayed) ?Labs Reviewed  ?CBC WITH DIFFERENTIAL/PLATELET  ?COMPREHENSIVE METABOLIC PANEL  ?URINALYSIS, ROUTINE W REFLEX MICROSCOPIC  ? ? ?EKG ?EKG Interpretation ? ?Date/Time:  Monday August 25 2021 17:29:38 EDT ?Ventricular Rate:  60 ?PR Interval:  156 ?QRS Duration: 89 ?QT Interval:  426 ?QTC Calculation: 377 ?R Axis:   55 ?Text Interpretation: Sinus rhythm Supraventricular bigeminy Nonspecific repol abnormality, inferior leads Confirmed by Elnora Morrison [phone]) on 08/25/2021 6:05:59 PM ? ?Radiology ?No results found. ? ?Procedures ?Procedures  ? ? ?Medications Ordered in ED ?Medications - No data to display ? ?ED Course/ Medical Decision Making/ A&P ?  ?                        ?Medical Decision Making ? ?Reviewed urgent care note which says that his blood pressures in the 846K systolics.  EKG did look different to prior on their interpretation and review.  Additional history obtained from wife at bedside.  Chart reviewed performed. ? ?On exam, hemodynamically stable.  Blood pressure 171/90.  EKG consistent with bigeminy with no acute ischemic findings.  Patient is asymptomatic.  Labs obtained to assess for any signs of endorgan damage.  CBC and CMP within normal limits.  Urinalysis unremarkable.  EKG reassuring.  See attendings interpretation above. Given patient's overall reassuring work-up in the setting of asymptomatic elevated blood pressures, stable for discharge at this time.  Shared decision-making occurred.  Patient plans to follow-up with his primary care provider for further evaluation.  He plans to  call his cardiologist regarding potential changes to his antihypertensives tomorrow.  Patient remained asymptomatic and was discharged.  ? ?Patient seen in conjunction my attending Dr. Reather Converse. ? ? ?Final Clinical Impression(s) / ED Diagnoses ?Final diagnoses:  ?Hypertension, unspecified type  ? ? ?Rx / DC Orders ?ED Discharge Orders   ? ? None  ? ?  ? ? ?  ?Lupita Dawn, MD ?08/26/21 0031 ? ?  ?Elnora Morrison, MD ?08/26/21 [phone] ? ?

## 2021-08-25 NOTE — ED Triage Notes (Signed)
Patient presents to Urgent Care with complaints of possible elevated blood pressure since the end of last week. He states he has noted his cuff reading has been in 170's. He states today it read 211/100. Pt states he reached out to his cardiologist prior to coming in and did not hear back so came in to UC. ? ?Denies changes in vision, chest pain, dizziness, or headache.  ?

## 2021-08-25 NOTE — ED Provider Triage Note (Signed)
Emergency Medicine Provider Triage Evaluation Note ? ?Justin Ayala , a 71 y.o. male  was evaluated in triage.  Pt complains of elevated blood pressure since Saturday reports systollically up to 712. Was seen at [facility] today and sent over here for further work up. Denies any headaches or blurry vision. Denies any hematuria, back pain, abdominal pain, or nausea. ? ?Review of Systems  ?Positive: High BP ?Negative: See above ? ?Physical Exam  ?BP (!) 188/106   Pulse 64   Temp 97.6 ?F (36.4 ?C) (Oral)   Resp 18   SpO2 98%  ?Gen:   Awake, no distress   ?Resp:  Normal effort  ?MSK:   Moves extremities without difficulty  ?Other:  Cranial nerves II-XII intact ? ?Medical Decision Making  ?Medically screening exam initiated at 4:44 PM.  Appropriate orders placed.  Justin Ayala was informed that the remainder of the evaluation will be completed by another provider, this initial triage assessment does not replace that evaluation, and the importance of remaining in the ED until their evaluation is complete. ? ?Basic labs ordered. That patient reports he took his BP medications today but doesn't know the names. ?  ?Justin Puller, PA-C ?08/25/21 1647 ? ?

## 2021-08-25 NOTE — Discharge Instructions (Addendum)
Please go to the emergency department as soon as you leave urgent care for further evaluation and management. ?

## 2021-08-25 NOTE — Discharge Instructions (Addendum)
You were evaluated in the Emergency Department and after careful evaluation, we did not find any emergent condition requiring admission or further testing in the hospital. ? ?Your exam/testing today was overall reassuring.  Please follow-up with your cardiologist and primary care provider.  Please contact the cardiology office to determine if an additional blood pressure medication is indicated given your high blood pressures at home. ? ?Please return to the Emergency Department if you experience any worsening of your condition.  Thank you for allowing [place] to be a part of your care.  ?

## 2021-08-25 NOTE — ED Notes (Signed)
Patient is being discharged from the Urgent Care and sent to the Emergency Department via POV . Per [place], NP, patient is in need of higher level of care due to elevated blood pressure. Patient is aware and verbalizes understanding of plan of care.  ?Vitals:  ? 08/25/21 1355  ?BP: (S) (!) 177/114  ?Pulse: 64  ?Resp: 16  ?Temp: 98.1 ?F (36.7 ?C)  ?SpO2: 95%  ?  ?

## 2021-08-25 NOTE — ED Triage Notes (Signed)
Pt c/o hypertension x1wk, highest pressure today 211/100. States 2wks ago he had episode where he blacked out after golf. States compliant w home medications. Seen at [facility] for HTN, abnormalities in EKG so sent to ED.  ?

## 2021-08-26 ENCOUNTER — Telehealth: Payer: Self-pay | Admitting: *Deleted

## 2021-08-26 ENCOUNTER — Encounter: Payer: Self-pay | Admitting: Cardiovascular Disease

## 2021-08-26 NOTE — Telephone Encounter (Signed)
Called pt to discuss his recent BP readings. Pt states that his BP has been going up and down with what seems like no pattern. Yesterday, at the ED he took his wrist BP cuff and they determined that the device was not accurate. BP at the ED was 177/114 but says when they tried his wrist cuff it read 202/117. He is still unsure of the accuracy of the cuff that he has so he has not been keeping a real log but states that at 4:04 pm today he took it with a normal BP cuff and it was 126/68 but when he took it with his wrist BP cuff it read 139/74. Now that he knows his wrist cuff is not as accurate he is concerned about keeping track of his BP.  ? ? ?Of note:  ?He did mention that on Saturday his BP averaged around 152/95 with the highest being 178/113 at 6:03 pm after he ate and the lowest being 126/70 at 10:21 pm.  ?

## 2021-08-26 NOTE — Telephone Encounter (Signed)
Call placed to the patient. He stated that he has been having increased blood pressures the past few weeks. He went to Urgent Care where he was informed that Dr. Gwenlyn Found was notified and a new blood pressure medication would be sent in for him. He then went to the ED for the same concerns. ? ?The patient stated that he has not checked his blood pressure today. He stated that he felt like he was getting the run around and wants to know why the medication was not sent in. He has been advised that we did not see a note of a new medication for him.  ? ?Per epic, his PCP has been prescribing his blood pressure medication. He has been advised that a message will be sent to Dr. Gwenlyn Found and his PCP.  ? ? ? ? ? ? ? ? ?

## 2021-08-26 NOTE — Telephone Encounter (Signed)
Patient is calling in regards to this message. Advised him I am sending an additional message to triage in regards to it. If he does not answer via phone call when calling back please respond through my chart. Best contact number is [phone].  ?

## 2021-08-26 NOTE — Telephone Encounter (Signed)
Error, not needed

## 2021-08-27 NOTE — Telephone Encounter (Signed)
Spoke with pt regarding appointment with PharmD. Able to get pt in on April 19 at [place] pt to bring his blood pressure cuff with him and blood pressure log. Pt verbalizes understanding.  ?

## 2021-09-10 ENCOUNTER — Encounter: Payer: Self-pay | Admitting: Cardiovascular Disease

## 2021-09-10 ENCOUNTER — Ambulatory Visit: Payer: Medicare HMO | Admitting: Pharmacist

## 2021-09-10 ENCOUNTER — Ambulatory Visit (INDEPENDENT_AMBULATORY_CARE_PROVIDER_SITE_OTHER): Payer: Medicare HMO | Admitting: Cardiovascular Disease

## 2021-09-10 VITALS — BP 138/82 | HR 67 | Ht 67.0 in | Wt 222.4 lb

## 2021-09-10 VITALS — BP 124/76 | HR 66 | Resp 15 | Ht 65.0 in | Wt 217.6 lb

## 2021-09-10 DIAGNOSIS — R7401 Elevation of levels of liver transaminase levels: Secondary | ICD-10-CM | POA: Insufficient documentation

## 2021-09-10 DIAGNOSIS — D500 Iron deficiency anemia secondary to blood loss (chronic): Secondary | ICD-10-CM | POA: Insufficient documentation

## 2021-09-10 DIAGNOSIS — R7402 Elevation of levels of lactic acid dehydrogenase (LDH): Secondary | ICD-10-CM | POA: Insufficient documentation

## 2021-09-10 DIAGNOSIS — R55 Syncope and collapse: Secondary | ICD-10-CM | POA: Diagnosis not present

## 2021-09-10 DIAGNOSIS — I2510 Atherosclerotic heart disease of native coronary artery without angina pectoris: Secondary | ICD-10-CM

## 2021-09-10 DIAGNOSIS — Z860100 Personal history of colon polyps, unspecified: Secondary | ICD-10-CM | POA: Insufficient documentation

## 2021-09-10 DIAGNOSIS — K626 Ulcer of anus and rectum: Secondary | ICD-10-CM | POA: Insufficient documentation

## 2021-09-10 DIAGNOSIS — I10 Essential (primary) hypertension: Secondary | ICD-10-CM

## 2021-09-10 DIAGNOSIS — K6289 Other specified diseases of anus and rectum: Secondary | ICD-10-CM | POA: Insufficient documentation

## 2021-09-10 NOTE — Patient Instructions (Signed)
Medication Instructions:  ?The current medical regimen is effective;  continue present plan and medications. ? ?*If you need a refill on your cardiac medications before your next appointment, please call your pharmacy* ? ? ?Follow-Up: ?At [facility], you and your health needs are our priority.  As part of our continuing mission to provide you with exceptional heart care, we have created designated Provider Care Teams.  These Care Teams include your primary Cardiologist (physician) and Advanced Practice Providers (APPs -  Physician Assistants and Nurse Practitioners) who all work together to provide you with the care you need, when you need it. ? ?We recommend signing up for the patient portal called "MyChart".  Sign up information is provided on this After Visit Summary.  MyChart is used to connect with patients for Virtual Visits (Telemedicine).  Patients are able to view lab/test results, encounter notes, upcoming appointments, etc.  Non-urgent messages can be sent to your provider as well.   ?To learn more about what you can do with MyChart, go to [email or web address].   ? ?Your next appointment:   ?As needed ? ?The format for your next appointment:   ?In Person ? ?Provider:   ?Eleonore Chiquito, MD ? ? ?Important Information About [place] ? ? ? ? ? ? ?

## 2021-09-10 NOTE — Progress Notes (Signed)
Patient ID: Justin Ayala                 DOB: 1951/02/23                      MRN: [id number] ? ? ? ? ?HPI: ?Justin Ayala is a 71 y.o. male referred by Dr. Gwenlyn Ayala to HTN clinic. PMH is significant for HTN, CAD, and HLD.  Seen in ED for HTN urgency on 08/25/21 with BP at 177/114. No identifiable cause.  PCP increased lisinopril to '20mg'$  daily. ? ?Patient presents today with home BP cuff.  Home readings fairly controlled since ED visit. Patient worried about what caused BP elevation. ? ?133/77 ?134/80 ?130/74 ?143/74 ?136/77 ?130/61 ?139/75 ?135/71 ? ?Reports on 08/07/21 had a syncopal episode while driving.  Has since driven to [facility] and back without incident.  Previously passed out last year after mowing the lawn in the heat. ? ?Current HTN meds:  ? ?Lisinopril '20mg'$  daily ?Metoprolol 12.'5mg'$  BID ?HCTZ '25mg'$  daily ? ?BP goal: <130/80 ? ?Wt Readings from Last 3 Encounters:  ?08/01/21 215 lb 8 oz (97.8 kg)  ?06/17/21 218 lb (98.9 kg)  ?09/18/20 213 lb (96.6 kg)  ? ?BP Readings from Last 3 Encounters:  ?08/25/21 (!) 152/89  ?08/25/21 (S) (!) 177/114  ?08/01/21 (!) 142/86  ? ?Pulse Readings from Last 3 Encounters:  ?08/25/21 (!) 59  ?08/25/21 64  ?08/01/21 81  ? ? ?Renal function: ?CrCl cannot be calculated (Unknown ideal weight.). ? ?Past Medical History:  ?Diagnosis Date  ? Allergic rhinitis   ? Anemia 2010  ? CAD (coronary artery disease)   ? 60-70% prox 1st diagonal branch stenosis, 50% dominant circumflex stenosis in PDA, normal LV function (by 10/12/2008 cath)  ? GERD (gastroesophageal reflux disease)   ? History of cardiovascular stress test 10/02/2008  ? exercise tolerance test - abnormal test - subsequent cath   ? HLD (hyperlipidemia)   ? HTN (hypertension)   ? Kidney stones   ? Overweight(278.02)   ? Peptic ulcer   ? Prostate cancer ([place])   ? ? ?Current Outpatient Medications on File Prior to Visit  ?Medication Sig Dispense Refill  ? ASPIRIN ADULT LOW STRENGTH 81 MG EC tablet TAKE 1 BY MOUTH DAILY 90 tablet 0  ?  Cholecalciferol (VITAMIN D-3) 1000 UNITS CAPS Take by mouth. Take one daily    ? Cranberry 125 MG TABS Take 1 tablet by mouth 2 (two) times a week.    ? ezetimibe (ZETIA) 10 MG tablet Take 1 tablet by mouth once daily 90 tablet 0  ? ferrous sulfate 325 (65 FE) MG tablet Take 1 tablet (325 mg total) by mouth daily. 90 tablet 3  ? Glucosamine-Chondroitin (OSTEO BI-FLEX REGULAR STRENGTH PO) Take 1 tablet by mouth daily.    ? hydrochlorothiazide (HYDRODIURIL) 25 MG tablet Take 1 tablet by mouth once daily 90 tablet 0  ? [name] 100 MG CAPS Take 1 capsule (100 mg total) by mouth daily. 90 each 0  ? lisinopril (ZESTRIL) 20 MG tablet Take 1 tablet by mouth once daily 60 tablet 9  ? meloxicam (MOBIC) 7.5 MG tablet Take 1 tablet by mouth once daily 90 tablet 0  ? metoprolol tartrate (LOPRESSOR) 25 MG tablet Take 1/2 (one-half) tablet by mouth twice daily 90 tablet 0  ? pantoprazole (PROTONIX) 40 MG tablet TAKE 1 TABLET BY MOUTH ONCE DAILY . APPOINTMENT REQUIRED FOR FUTURE REFILLS 90 tablet 3  ? promethazine-dextromethorphan (PROMETHAZINE-DM) 6.25-15 MG/5ML syrup  Take 5 mLs by mouth 4 (four) times daily as needed for cough. 118 mL 0  ? [place] 420 MG/3.5ML SOCT INJECT 420 MG (3.5 MLS) INTO THE SKIN EVERY 30 DAYS 3.6 mL 11  ? Resveratrol 100 MG CAPS Take 1 capsule by mouth daily.    ? tamsulosin (FLOMAX) 0.4 MG CAPS capsule Take 1 capsule by mouth twice daily 180 capsule 0  ? ?No current facility-administered medications on file prior to visit.  ? ? ?Allergies  ?Allergen Reactions  ? Ace Inhibitors Cough  ? ? ? ?Assessment/Plan: ? ?1. Hypertension -  Patient BP in room today 124/76 which is at goal of <130/80.  Unclear what caused hypertensive urgency earlier this month. Concern regarding syncopal episode in March.  Consulted with nurse triage regarding possibly ordering EKG. Recommended to schedule with DOD today for cardiac workup.  Patient scheduled and voiced understanding. ? ?Continue: ?Lisinopril  '20mg'$  daily ?Metoprolol 12.'5mg'$  BID ?HCTZ '25mg'$  daily ? ?Justin Ayala, PharmD, BCACP, [place], CPP ?[address], [address] ?[place], [place], [address] ?Phone: [phone], Fax: [phone]  ?

## 2021-09-10 NOTE — Progress Notes (Signed)
?Cardiology Office Note:   ?Date:  09/10/2021  ?NAME:  Justin Ayala    ?MRN: [id number] ?DOB:  05/15/1951  ? ?PCP:  Hoyt Koch, MD  ?Cardiologist:  Quay Burow, MD  ?Electrophysiologist:  None  ? ?Referring MD: Hoyt Koch, *  ? ?Chief Complaint  ?Patient presents with  ? Loss of Consciousness  ? ? ?History of Present Illness:   ?Justin Ayala is a 71 y.o. male with a hx of CAD, HTN, HLD who presents for follow-up. Seen in ER 4/5 for HTN. Recent syncope reported.  He reports he had a syncopal episode roughly 4 weeks ago.  He reports this occurred around 1 PM.  He was driving.  Apparently he had a brief loss of consciousness.  He did not crash the car.  He pulled off the road slightly and then realized he was off the road and quickly got back on the road.  He tells me that he had no chest pain or trouble breathing before the episode.  No rapid heartbeat.  He did not urinate or defecate on himself.  He did not have a seizure.  He reports he had played golf that morning.  Apparently he had not had much to eat that day.  He is not drinking enough water either.  He came to very quickly.  He did not crash the car.  His cardiovascular semination is normal.  He does have a history of CAD but this has been well controlled.  He describes no symptoms of angina.  He does inform me that last year while cutting grass he had a similar episode.  This occurred after not eating or drinking much that morning.  I believe this is the issue.  His EKG looks unchanged from prior.  He is also been diagnosed with elevations in blood pressure.  Has been working with his primary cardiologist on this.  Blood pressure is much better.  All of the episodes of coincided with a diagnosis of elevated blood pressures but this seems to be better.  I suspect this was all combination.  He has had no further episodes and seems to be quite stable. ? ?Problem List ?CAD ?-70% ostial LAD (2010) ?2. HLD ?3. Carotid artery disease ?-1-39%  bilateral  ? ?Past Medical History: ?Past Medical History:  ?Diagnosis Date  ? Allergic rhinitis   ? Anemia 2010  ? CAD (coronary artery disease)   ? 60-70% prox 1st diagonal branch stenosis, 50% dominant circumflex stenosis in PDA, normal LV function (by 10/12/2008 cath)  ? GERD (gastroesophageal reflux disease)   ? History of cardiovascular stress test 10/02/2008  ? exercise tolerance test - abnormal test - subsequent cath   ? HLD (hyperlipidemia)   ? HTN (hypertension)   ? Kidney stones   ? Overweight(278.02)   ? Peptic ulcer   ? Prostate cancer ([place])   ? ? ?Past Surgical History: ?Past Surgical History:  ?Procedure Laterality Date  ? APPENDECTOMY  12/2008  ? Dr. Clyda Greener  ? BIOPSY PROSTATE  03/19/11  ? gleason 3+3=6, volume 34 cc, psa 01/06/11 5.00  ? CARDIAC CATHETERIZATION  2010  ? non-critical stenosis  ? FLEXIBLE SIGMOIDOSCOPY N/A 08/18/2013  ? Procedure: FLEXIBLE SIGMOIDOSCOPY;  Surgeon: Beryle Beams, MD;  Location: [facility];  Service: Endoscopy;  Laterality: N/A;  ? HOT HEMOSTASIS N/A 08/18/2013  ? Procedure: HOT HEMOSTASIS (ARGON PLASMA COAGULATION/BICAP);  Surgeon: Beryle Beams, MD;  Location: [facility];  Service: Endoscopy;  Laterality: N/A;  AVM -  sig colon  ? RADIOACTIVE SEED IMPLANT  08/14/2011  ? Procedure: RADIOACTIVE SEED IMPLANT;  Surgeon: Claybon Jabs, MD;  Location: [facility];  Service: Urology;  Laterality: N/A;  84  SEEDS IMPLANTED IN PROSTATE  ? TONSILLECTOMY    ? ? ?Current Medications: ?Current Meds  ?Medication Sig  ? ASPIRIN ADULT LOW STRENGTH 81 MG EC tablet TAKE 1 BY MOUTH DAILY  ? Cholecalciferol (VITAMIN D-3) 1000 UNITS CAPS Take by mouth. Take one daily  ? Cranberry 125 MG TABS Take 1 tablet by mouth 2 (two) times a week.  ? ezetimibe (ZETIA) 10 MG tablet Take 1 tablet by mouth once daily  ? ferrous sulfate 325 (65 FE) MG tablet Take 1 tablet (325 mg total) by mouth daily.  ? Glucosamine-Chondroitin (OSTEO BI-FLEX REGULAR STRENGTH PO) Take 1 tablet by mouth  daily.  ? hydrochlorothiazide (HYDRODIURIL) 25 MG tablet Take 1 tablet by mouth once daily  ? [name] 100 MG CAPS Take 1 capsule (100 mg total) by mouth daily.  ? lisinopril (ZESTRIL) 20 MG tablet Take 1 tablet by mouth once daily  ? meloxicam (MOBIC) 7.5 MG tablet Take 1 tablet by mouth once daily  ? metoprolol tartrate (LOPRESSOR) 25 MG tablet Take 1/2 (one-half) tablet by mouth twice daily  ? pantoprazole (PROTONIX) 40 MG tablet TAKE 1 TABLET BY MOUTH ONCE DAILY . APPOINTMENT REQUIRED FOR FUTURE REFILLS  ? [place] 420 MG/3.5ML SOCT INJECT 420 MG (3.5 MLS) INTO THE SKIN EVERY 30 DAYS  ? Resveratrol 100 MG CAPS Take 1 capsule by mouth daily.  ? tamsulosin (FLOMAX) 0.4 MG CAPS capsule Take 1 capsule by mouth twice daily  ?  ? ?Allergies:    ?Lipitor [atorvastatin]  ? ?Social History: ?Social History  ? ?Socioeconomic History  ? Marital status: Married  ?  Spouse name: Not on file  ? Number of children: 3  ? Years of education: BS   ? Highest education level: Not on file  ?Occupational History  ?  Employer: [employer]  ?Tobacco Use  ? Smoking status: Never  ? Smokeless tobacco: Never  ?Substance and Sexual Activity  ? Alcohol use: No  ? Drug use: No  ? Sexual activity: Yes  ?  Partners: Female  ?Other Topics Concern  ? Not on file  ?Social History Narrative  ? married x 30+years; 3 children, 4 grandsons   ? no smoking or ETOH;   ? Retired Merchant navy officer formerly at [employer];   ? likes to golf (golfs 3-4 times a week); no exercise and rides cart while golfing  ?   ?   ? ?Social Determinants of Health  ? ?Financial Resource Strain: Not on file  ?Food Insecurity: Not on file  ?Transportation Needs: Not on file  ?Physical Activity: Not on file  ?Stress: Not on file  ?Social Connections: Not on file  ?  ? ?Family History: ?The patient's family history includes Cervical cancer in his mother; Heart failure in his paternal grandfather and paternal grandmother; Hypertension in his father and  mother; Prostate cancer in his brother; Stroke in his father. There is no history of Diabetes. ? ?ROS:   ?All other ROS reviewed and negative. Pertinent positives noted in the HPI.    ? ?EKGs/Labs/Other Studies Reviewed:   ?The following studies were personally reviewed by me today: ? ?EKG:  EKG is ordered today.  The ekg ordered today demonstrates normal sinus rhythm heart rate 67, no acute ischemic changes or evidence of infarction, and  was personally reviewed by me.  ? ?[place] 10/12/2008 ?SELECTIVE CORONARY ANGIOGRAPHY: ? 1. Left main, normal. ? 2. LAD; LAD was a large vessel which wrapped the apex.  He had give ?     off a moderate-to-large first diagonal branch with 60-70% ostial ?     stenosis. ? 3. Left circumflex; dominant with scattered 30-50% stenosis in the mid ?     and distal portion. ? 4. Right coronary artery; nondominant with minor irregularities. ? 5. Ventriculography; RAO left ventriculogram was performed using 25 mL ?     of Visipaque dye at 12 mL per second.  The overall LVEF was ?     estimated at greater than 50% without focal wall motion ?     abnormalities. ? ?Recent Labs: ?08/25/2021: ALT 19; BUN 14; Creatinine, Ser 1.21; Hemoglobin 13.2; Platelets 256; Potassium 3.8; Sodium 140  ? ?Recent Lipid Panel ?   ?Component Value Date/Time  ? CHOL 117 06/14/2020 1037  ? TRIG 102 06/14/2020 1037  ? HDL 47 06/14/2020 1037  ? CHOLHDL 2.5 06/14/2020 1037  ? CHOLHDL 3 10/24/2018 0957  ? VLDL 23.8 10/24/2018 0957  ? LDLCALC 51 06/14/2020 1037  ? LDLDIRECT 84.3 05/30/2014 1102  ? ? ?Physical Exam:   ?VS:  BP 138/82 (BP Location: Left Arm, Patient Position: Sitting, Cuff Size: Large)   Pulse 67   Ht '5\' 7"'$  (1.702 m)   Wt 222 lb 6.4 oz (100.9 kg)   SpO2 98%   BMI 34.83 kg/m?    ?Wt Readings from Last 3 Encounters:  ?09/10/21 222 lb 6.4 oz (100.9 kg)  ?09/10/21 217 lb 9.6 oz (98.7 kg)  ?08/01/21 215 lb 8 oz (97.8 kg)  ?  ?General: Well nourished, well developed, in no acute distress ?Head: Atraumatic, normal  size  ?Eyes: PEERLA, EOMI  ?Neck: Supple, no JVD ?Endocrine: No thryomegaly ?Cardiac: Normal S1, S2; RRR; no murmurs, rubs, or gallops ?Lungs: Clear to auscultation bilaterally, no wheezing, rhonchi or rales  ?A

## 2021-09-11 ENCOUNTER — Other Ambulatory Visit: Payer: Self-pay | Admitting: Cardiovascular Disease

## 2021-09-11 ENCOUNTER — Telehealth: Payer: Self-pay

## 2021-09-11 DIAGNOSIS — I6522 Occlusion and stenosis of left carotid artery: Secondary | ICD-10-CM

## 2021-09-11 DIAGNOSIS — I2510 Atherosclerotic heart disease of native coronary artery without angina pectoris: Secondary | ICD-10-CM

## 2021-09-11 DIAGNOSIS — I10 Essential (primary) hypertension: Secondary | ICD-10-CM

## 2021-09-11 NOTE — Progress Notes (Signed)
? ? ?Chronic Care Management ?Pharmacy Assistant  ? ?Name: Justin Ayala  MRN: [id number] DOB: 12-29-1950 ? ? ? ?Reason for Encounter: Disease State-Adherence ?  ? ?Recent office visits:  ?08/01/21 Justin Holm A, MD-PCP (Cough) No orders:prednisone to take 2 pills daily for 4 days and  promethazine/dm cough syrup to help with cough ? ?Recent consult visits:  ?09/10/21 Justin Rile, MD- Cardiology (Vasovagal syncope) Ekg was ordered, no med changes ? ?Hospital visits:  ?Medication Reconciliation was completed by comparing discharge summary, patient?s EMR and Pharmacy list, and upon discussion with patient. ? ?Admitted to the hospital on 08/25/21 due to hypertension. Discharge date was 08/25/21. Discharged from [facility] ED  ? ?Medications that remain the same after Hospital Discharge:??  ?-All other medications will remain the same.   ? ?Medications: ?Outpatient Encounter Medications as of 09/11/2021  ?Medication Sig Note  ? ASPIRIN ADULT LOW STRENGTH 81 MG EC tablet TAKE 1 BY MOUTH DAILY   ? Cholecalciferol (VITAMIN D-3) 1000 UNITS CAPS Take by mouth. Take one daily   ? Cranberry 125 MG TABS Take 1 tablet by mouth 2 (two) times Ayala week.   ? ezetimibe (ZETIA) 10 MG tablet Take 1 tablet by mouth once daily   ? ferrous sulfate 325 (65 FE) MG tablet Take 1 tablet (325 mg total) by mouth daily.   ? Glucosamine-Chondroitin (OSTEO BI-FLEX REGULAR STRENGTH PO) Take 1 tablet by mouth daily.   ? hydrochlorothiazide (HYDRODIURIL) 25 MG tablet Take 1 tablet by mouth once daily   ? [name] 100 MG CAPS Take 1 capsule (100 mg total) by mouth daily.   ? lisinopril (ZESTRIL) 20 MG tablet Take 1 tablet by mouth once daily   ? meloxicam (MOBIC) 7.5 MG tablet Take 1 tablet by mouth once daily   ? metoprolol tartrate (LOPRESSOR) 25 MG tablet Take 1/2 (one-half) tablet by mouth twice daily   ? pantoprazole (PROTONIX) 40 MG tablet TAKE 1 TABLET BY MOUTH ONCE DAILY . APPOINTMENT REQUIRED FOR FUTURE REFILLS    ? [place] 420 MG/3.5ML SOCT INJECT 420 MG (3.5 MLS) INTO THE SKIN EVERY 30 DAYS   ? Resveratrol 100 MG CAPS Take 1 capsule by mouth daily.   ? tamsulosin (FLOMAX) 0.4 MG CAPS capsule Take 1 capsule by mouth twice daily 09/10/2021: Patient takes 1 capsule daily  ? ?No facility-administered encounter medications on file as of 09/11/2021.  ? ?Reviewed chart prior to disease state call. Spoke with patient regarding BP ? ?Recent Office Vitals: ?BP Readings from Last 3 Encounters:  ?09/10/21 138/82  ?09/10/21 124/76  ?08/25/21 (!) 152/89  ? ?Pulse Readings from Last 3 Encounters:  ?09/10/21 67  ?09/10/21 66  ?08/25/21 (!) 59  ?  ?Wt Readings from Last 3 Encounters:  ?09/10/21 222 lb 6.4 oz (100.9 kg)  ?09/10/21 217 lb 9.6 oz (98.7 kg)  ?08/01/21 215 lb 8 oz (97.8 kg)  ?  ? ?Kidney Function ?Lab Results  ?Component Value Date/Time  ? CREATININE 1.21 08/25/2021 05:00 PM  ? CREATININE 1.32 09/10/2020 04:21 PM  ? CREATININE 1.28 01/27/2013 01:49 PM  ? CREATININE 1.12 12/20/2012 04:00 PM  ? GFR 54.15 (L) 09/10/2020 04:21 PM  ? GFRNONAA >60 08/25/2021 05:00 PM  ? GFRAA 80 (L) 07/26/2013 02:34 AM  ? ? ? ?  Latest Ref Rng & Units 08/25/2021  ?  5:00 PM 09/10/2020  ?  4:21 PM 09/18/2019  ?  1:17 PM  ?BMP  ?Glucose 70 - 99 mg/dL 85  85   106    ?BUN 8 - 23 mg/dL '14   20   13    '$ ?Creatinine 0.61 - 1.24 mg/dL 1.21   1.32   1.22    ?Sodium 135 - 145 mmol/L 140   140   139    ?Potassium 3.5 - 5.1 mmol/L 3.8   3.4   3.7    ?Chloride 98 - 111 mmol/L 108   102   103    ?CO2 22 - 32 mmol/L '25   31   31    '$ ?Calcium 8.9 - 10.3 mg/dL 8.9   9.6   9.2    ? ? ?Current antihypertensive regimen:  ?Lisinopril 20 mg ?Hydrochlorothiazide 25 mg ?Metoprolol 25 mg ? ?How often are you checking your Blood Pressure? Patient states that he checks blood pressure daily ? ?Current home BP readings: Patient states that today his blood pressure was 122/77, and yesterday 133/77 ? ?What recent interventions/DTPs have been made by any provider to  improve Blood Pressure control since last CPP Visit: None noted since last coordination call ? ?Any recent hospitalizations or ED visits since last visit with CPP? Yes, patient was seen at the ED for syncope ? ?What diet changes have been made to improve Blood Pressure Control?  ?Patient states that he has not made any changes to his diet ? ?What exercise is being done to improve your Blood Pressure Control?  ?Patient states that he has not made any changes to exercise ? ?Adherence Review: ?Is the patient currently on ACE/ARB medication? No ?Does the patient have >5 day gap between last estimated fill dates? No ? ? ? Care Gaps: ?Colonoscopy-02/16/19 ?Diabetic Foot Exam-NA ?Ophthalmology-NA ?Dexa Scan - NA ?Annual Well Visit - 09/18/20 ?Micro albumin-NA ?Hemoglobin A1c- 10/24/18 ?  ?Star Rating Drugs: ?Lisinopril 20 mg-last fill 08/08/21 60 ds ?  ?Justin Ayala ?Clinical Pharmacist Assistant ?[phone]  ?

## 2021-09-19 ENCOUNTER — Other Ambulatory Visit: Payer: Self-pay | Admitting: Cardiovascular Disease

## 2021-09-19 ENCOUNTER — Ambulatory Visit (INDEPENDENT_AMBULATORY_CARE_PROVIDER_SITE_OTHER): Payer: Medicare HMO

## 2021-09-19 DIAGNOSIS — Z0000 Encounter for general adult medical examination without abnormal findings: Secondary | ICD-10-CM | POA: Diagnosis not present

## 2021-09-19 NOTE — Patient Instructions (Signed)
Mr. Peruski , ?Thank you for taking time to come for your Medicare Wellness Visit. I appreciate your ongoing commitment to your health goals. Please review the following plan we discussed and let me know if I can assist you in the future.  ? ?Screening recommendations/referrals: ?Colonoscopy: 02/16/2019; due every 5 years ?Recommended yearly ophthalmology/optometry visit for glaucoma screening and checkup ?Recommended yearly dental visit for hygiene and checkup ? ?Vaccinations: ?Influenza vaccine: declined ?Pneumococcal vaccine: declined ?Tdap vaccine: 12/20/2012; due every 10 years ?Shingles vaccine: declined   ?Covid-19: 07/16/2019, 08/09/2019 ? ?Advanced directives: No ? ?Conditions/risks identified: Yes ? ?Next appointment: Please schedule your next Medicare Wellness Visit with your Nurse Health Advisor in 1 year by calling [phone]. ? ?Preventive Care 76 Years and Older, Male ?Preventive care refers to lifestyle choices and visits with your health care provider that can promote health and wellness. ?What does preventive care include? ?A yearly physical exam. This is also called an annual well check. ?Dental exams once or twice a year. ?Routine eye exams. Ask your health care provider how often you should have your eyes checked. ?Personal lifestyle choices, including: ?Daily care of your teeth and gums. ?Regular physical activity. ?Eating a healthy diet. ?Avoiding tobacco and drug use. ?Limiting alcohol use. ?Practicing safe sex. ?Taking low doses of aspirin every day. ?Taking vitamin and mineral supplements as recommended by your health care provider. ?What happens during an annual well check? ?The services and screenings done by your health care provider during your annual well check will depend on your age, overall health, lifestyle risk factors, and family history of disease. ?Counseling  ?Your health care provider may ask you questions about your: ?Alcohol use. ?Tobacco use. ?Drug use. ?Emotional  well-being. ?Home and relationship well-being. ?Sexual activity. ?Eating habits. ?History of falls. ?Memory and ability to understand (cognition). ?Work and work Statistician. ?Screening  ?You may have the following tests or measurements: ?Height, weight, and BMI. ?Blood pressure. ?Lipid and cholesterol levels. These may be checked every 5 years, or more frequently if you are over 43 years old. ?Skin check. ?Lung cancer screening. You may have this screening every year starting at age 34 if you have a 30-pack-year history of smoking and currently smoke or have quit within the past 15 years. ?Fecal occult blood test (FOBT) of the stool. You may have this test every year starting at age 40. ?Flexible sigmoidoscopy or colonoscopy. You may have a sigmoidoscopy every 5 years or a colonoscopy every 10 years starting at age 49. ?Prostate cancer screening. Recommendations will vary depending on your family history and other risks. ?Hepatitis C blood test. ?Hepatitis B blood test. ?Sexually transmitted disease (STD) testing. ?Diabetes screening. This is done by checking your blood sugar (glucose) after you have not eaten for a while (fasting). You may have this done every 1-3 years. ?Abdominal aortic aneurysm (AAA) screening. You may need this if you are a current or former smoker. ?Osteoporosis. You may be screened starting at age 74 if you are at high risk. ?Talk with your health care provider about your test results, treatment options, and if necessary, the need for more tests. ?Vaccines  ?Your health care provider may recommend certain vaccines, such as: ?Influenza vaccine. This is recommended every year. ?Tetanus, diphtheria, and acellular pertussis (Tdap, Td) vaccine. You may need a Td booster every 10 years. ?Zoster vaccine. You may need this after age 66. ?Pneumococcal 13-valent conjugate (PCV13) vaccine. One dose is recommended after age 53. ?Pneumococcal polysaccharide (PPSV23) vaccine. One dose is  recommended after  age 31. ?Talk to your health care provider about which screenings and vaccines you need and how often you need them. ?This information is not intended to replace advice given to you by your health care provider. Make sure you discuss any questions you have with your health care provider. ?Document Released: 06/07/2015 Document Revised: 01/29/2016 Document Reviewed: 03/12/2015 ?Elsevier Interactive Patient Education ? 2017 [place]. ? ?Fall Prevention in the Home ?Falls can cause injuries. They can happen to people of all ages. There are many things you can do to make your home safe and to help prevent falls. ?What can I do on the outside of my home? ?Regularly fix the edges of walkways and driveways and fix any cracks. ?Remove anything that might make you trip as you walk through a door, such as a raised step or threshold. ?Trim any bushes or trees on the path to your home. ?Use bright outdoor lighting. ?Clear any walking paths of anything that might make someone trip, such as rocks or tools. ?Regularly check to see if handrails are loose or broken. Make sure that both sides of any steps have handrails. ?Any raised decks and porches should have guardrails on the edges. ?Have any leaves, snow, or ice cleared regularly. ?Use sand or salt on walking paths during winter. ?Clean up any spills in your garage right away. This includes oil or grease spills. ?What can I do in the bathroom? ?Use night lights. ?Install grab bars by the toilet and in the tub and shower. Do not use towel bars as grab bars. ?Use non-skid mats or decals in the tub or shower. ?If you need to sit down in the shower, use a plastic, non-slip stool. ?Keep the floor dry. Clean up any water that spills on the floor as soon as it happens. ?Remove soap buildup in the tub or shower regularly. ?Attach bath mats securely with double-sided non-slip rug tape. ?Do not have throw rugs and other things on the floor that can make you trip. ?What can I do in the  bedroom? ?Use night lights. ?Make sure that you have a light by your bed that is easy to reach. ?Do not use any sheets or blankets that are too big for your bed. They should not hang down onto the floor. ?Have a firm chair that has side arms. You can use this for support while you get dressed. ?Do not have throw rugs and other things on the floor that can make you trip. ?What can I do in the kitchen? ?Clean up any spills right away. ?Avoid walking on wet floors. ?Keep items that you use a lot in easy-to-reach places. ?If you need to reach something above you, use a strong step stool that has a grab bar. ?Keep electrical cords out of the way. ?Do not use floor polish or wax that makes floors slippery. If you must use wax, use non-skid floor wax. ?Do not have throw rugs and other things on the floor that can make you trip. ?What can I do with my stairs? ?Do not leave any items on the stairs. ?Make sure that there are handrails on both sides of the stairs and use them. Fix handrails that are broken or loose. Make sure that handrails are as long as the stairways. ?Check any carpeting to make sure that it is firmly attached to the stairs. Fix any carpet that is loose or worn. ?Avoid having throw rugs at the top or bottom of the stairs. If  you do have throw rugs, attach them to the floor with carpet tape. ?Make sure that you have a light switch at the top of the stairs and the bottom of the stairs. If you do not have them, ask someone to add them for you. ?What else can I do to help prevent falls? ?Wear shoes that: ?Do not have high heels. ?Have rubber bottoms. ?Are comfortable and fit you well. ?Are closed at the toe. Do not wear sandals. ?If you use a stepladder: ?Make sure that it is fully opened. Do not climb a closed stepladder. ?Make sure that both sides of the stepladder are locked into place. ?Ask someone to hold it for you, if possible. ?Clearly mark and make sure that you can see: ?Any grab bars or  handrails. ?First and last steps. ?Where the edge of each step is. ?Use tools that help you move around (mobility aids) if they are needed. These include: ?Canes. ?Walkers. ?Scooters. ?Crutches. ?Turn on the lights when y

## 2021-09-19 NOTE — Progress Notes (Signed)
?I connected with Justin Ayala today by telephone and verified that I am speaking with the correct person using two identifiers. ?Location patient: home ?Location provider: work ?Persons participating in the virtual visit: patient, provider. ?  ?I discussed the limitations, risks, security and privacy concerns of performing an evaluation and management service by telephone and the availability of in person appointments. I also discussed with the patient that there may be a patient responsible charge related to this service. The patient expressed understanding and verbally consented to this telephonic visit.  ?  ?Interactive audio and video telecommunications were attempted between this provider and patient, however failed, due to patient having technical difficulties OR patient did not have access to video capability.  We continued and completed visit with audio only. ? ?Some vital signs may be absent or patient reported.  ? ?Time Spent with patient on telephone encounter: 30 minutes ? ?Subjective:  ? Justin Ayala is a 71 y.o. male who presents for Medicare Annual/Subsequent preventive examination. ? ?Review of Systems    ? ?Cardiac Risk Factors include: advanced age (>74mn, >>77women);dyslipidemia;family history of premature cardiovascular disease;hypertension;male gender;obesity (BMI >30kg/m2) ? ?   ?Objective:  ?  ?Today's Vitals  ? 09/19/21 0917  ?PainSc: 0-No pain  ? ?There is no height or weight on file to calculate BMI. ? ? ?  09/19/2021  ?  9:20 AM 08/25/2021  ?  5:25 PM 01/18/2020  ?  8:04 AM 01/09/2015  ?  9:04 AM 10/10/2013  ?  8:50 AM 08/18/2013  ?  8:46 AM 08/14/2011  ?  8:49 AM  ?Advanced Directives  ?Does Patient Have a Medical Advance Directive? No No No No Patient does not have advance directive;Patient would like information Patient does not have advance directive Patient does not have advance directive  ?Would patient like information on creating a medical advance directive? No - Guardian declined No  - Patient declined No - Patient declined No - patient declined information Advance directive brochure given (Outpatient ONLY)    ?Pre-existing out of facility DNR order (yellow form or pink MOST form)       No  ? ? ?Current Medications (verified) ?Outpatient Encounter Medications as of 09/19/2021  ?Medication Sig  ? ASPIRIN ADULT LOW STRENGTH 81 MG EC tablet TAKE 1 BY MOUTH DAILY  ? Cholecalciferol (VITAMIN D-3) 1000 UNITS CAPS Take by mouth. Take one daily  ? Cranberry 125 MG TABS Take 1 tablet by mouth 2 (two) times a week.  ? ezetimibe (ZETIA) 10 MG tablet Take 1 tablet by mouth once daily  ? ferrous sulfate 325 (65 FE) MG tablet Take 1 tablet (325 mg total) by mouth daily.  ? Glucosamine-Chondroitin (OSTEO BI-FLEX REGULAR STRENGTH PO) Take 1 tablet by mouth daily.  ? hydrochlorothiazide (HYDRODIURIL) 25 MG tablet Take 1 tablet by mouth once daily  ? KMicronesiaGinseng 100 MG CAPS Take 1 capsule (100 mg total) by mouth daily.  ? lisinopril (ZESTRIL) 20 MG tablet Take 1 tablet by mouth once daily  ? meloxicam (MOBIC) 7.5 MG tablet Take 1 tablet by mouth once daily  ? metoprolol tartrate (LOPRESSOR) 25 MG tablet Take 1/2 (one-half) tablet by mouth twice daily  ? pantoprazole (PROTONIX) 40 MG tablet TAKE 1 TABLET BY MOUTH ONCE DAILY . APPOINTMENT REQUIRED FOR FUTURE REFILLS  ? RS[place]et420 MG/3.5ML SOCT INJECT 420 MG (3.5 MLS) INTO THE SKIN EVERY 30 DAYS  ? Resveratrol 100 MG CAPS Take 1 capsule by mouth daily.  ? tamsulosin (  FLOMAX) 0.4 MG CAPS capsule Take 1 capsule by mouth twice daily  ? ?No facility-administered encounter medications on file as of 09/19/2021.  ? ? ?Allergies (verified) ?Lipitor [atorvastatin]  ? ?History: ?Past Medical History:  ?Diagnosis Date  ? Allergic rhinitis   ? Anemia 2010  ? CAD (coronary artery disease)   ? 60-70% prox 1st diagonal branch stenosis, 50% dominant circumflex stenosis in PDA, normal LV function (by 10/12/2008 cath)  ? GERD (gastroesophageal reflux disease)   ?  History of cardiovascular stress test 10/02/2008  ? exercise tolerance test - abnormal test - subsequent cath   ? HLD (hyperlipidemia)   ? HTN (hypertension)   ? Kidney stones   ? Overweight(278.02)   ? Peptic ulcer   ? Prostate cancer ([place])   ? ?Past Surgical History:  ?Procedure Laterality Date  ? APPENDECTOMY  12/2008  ? Dr. Clyda Greener  ? BIOPSY PROSTATE  03/19/11  ? gleason 3+3=6, volume 34 cc, psa 01/06/11 5.00  ? CARDIAC CATHETERIZATION  2010  ? non-critical stenosis  ? FLEXIBLE SIGMOIDOSCOPY N/A 08/18/2013  ? Procedure: FLEXIBLE SIGMOIDOSCOPY;  Surgeon: Beryle Beams, MD;  Location: [facility];  Service: Endoscopy;  Laterality: N/A;  ? HOT HEMOSTASIS N/A 08/18/2013  ? Procedure: HOT HEMOSTASIS (ARGON PLASMA COAGULATION/BICAP);  Surgeon: Beryle Beams, MD;  Location: [facility];  Service: Endoscopy;  Laterality: N/A;  AVM - sig colon  ? RADIOACTIVE SEED IMPLANT  08/14/2011  ? Procedure: RADIOACTIVE SEED IMPLANT;  Surgeon: Claybon Jabs, MD;  Location: [facility];  Service: Urology;  Laterality: N/A;  84  SEEDS IMPLANTED IN PROSTATE  ? TONSILLECTOMY    ? ?Family History  ?Problem Relation Age of Onset  ? Hypertension Father   ?     also organ failure  ? Stroke Father   ? Hypertension Mother   ? Cervical cancer Mother   ? Prostate cancer Brother   ?     seed implant  ? Heart failure Paternal Grandmother   ? Heart failure Paternal Grandfather   ? Diabetes Neg Hx   ? ?Social History  ? ?Socioeconomic History  ? Marital status: Married  ?  Spouse name: Not on file  ? Number of children: 3  ? Years of education: BS   ? Highest education level: Not on file  ?Occupational History  ?  Employer: [employer]  ?Tobacco Use  ? Smoking status: Never  ? Smokeless tobacco: Never  ?Substance and Sexual Activity  ? Alcohol use: No  ? Drug use: No  ? Sexual activity: Yes  ?  Partners: Female  ?Other Topics Concern  ? Not on file  ?Social History Narrative  ? married x 30+years; 3 children, 4 grandsons   ? no smoking  or ETOH;   ? Retired Merchant navy officer formerly at [employer];   ? likes to golf (golfs 3-4 times a week); no exercise and rides cart while golfing  ?   ?   ? ?Social Determinants of Health  ? ?Financial Resource Strain: Low Risk   ? Difficulty of Paying Living Expenses: Not hard at all  ?Food Insecurity: No Food Insecurity  ? Worried About Charity fundraiser in the Last Year: Never true  ? Ran Out of Food in the Last Year: Never true  ?Transportation Needs: No Transportation Needs  ? Lack of Transportation (Medical): No  ? Lack of Transportation (Non-Medical): No  ?Physical Activity: Sufficiently Active  ? Days of Exercise per Week: 5 days  ?  Minutes of Exercise per Session: 30 min  ?Stress: No Stress Concern Present  ? Feeling of Stress : Not at all  ?Social Connections: Socially Integrated  ? Frequency of Communication with Friends and Family: More than three times a week  ? Frequency of Social Gatherings with Friends and Family: More than three times a week  ? Attends Religious Services: More than 4 times per year  ? Active Member of Clubs or Organizations: Yes  ? Attends Archivist Meetings: More than 4 times per year  ? Marital Status: Married  ? ? ?Tobacco Counseling ?Counseling given: Not Answered ? ? ?Clinical Intake: ? ?Pre-visit preparation completed: Yes ? ?Pain : No/denies pain ?Pain Score: 0-No pain ? ?  ? ?Nutritional Risks: None ?Diabetes: No ? ?How often do you need to have someone help you when you read instructions, pamphlets, or other written materials from your doctor or pharmacy?: 1 - Never ?What is the last grade level you completed in school?: Bachelor's Degree ? ?Diabetic? no ? ?Interpreter Needed?: No ? ?Information entered by :: Lisette Abu, LPN ? ? ?Activities of Daily Living ? ?  09/19/2021  ?  9:35 AM  ?In your present state of health, do you have any difficulty performing the following activities:  ?Hearing? 0  ?Vision? 0  ?Difficulty concentrating or making  decisions? 0  ?Walking or climbing stairs? 0  ?Dressing or bathing? 0  ?Doing errands, shopping? 0  ?Preparing Food and eating ? N  ?Using the Toilet? N  ?In the past six months, have you accidently leaked urin

## 2021-09-22 ENCOUNTER — Encounter (HOSPITAL_COMMUNITY): Payer: Medicare HMO

## 2021-09-22 ENCOUNTER — Ambulatory Visit (HOSPITAL_COMMUNITY)
Admission: RE | Admit: 2021-09-22 | Discharge: 2021-09-22 | Disposition: A | Discharge status: Home / Self Care | Payer: Medicare HMO | Source: Ambulatory Visit | Attending: Cardiology | Admitting: Cardiology

## 2021-09-22 ENCOUNTER — Encounter: Payer: Self-pay | Admitting: Internal Medicine

## 2021-09-22 ENCOUNTER — Ambulatory Visit (INDEPENDENT_AMBULATORY_CARE_PROVIDER_SITE_OTHER): Payer: Medicare HMO | Admitting: Internal Medicine

## 2021-09-22 VITALS — BP 130/78 | HR 67 | Temp 98.2°F | Ht 67.0 in | Wt 216.0 lb

## 2021-09-22 DIAGNOSIS — Z6833 Body mass index (BMI) 33.0-33.9, adult: Secondary | ICD-10-CM

## 2021-09-22 DIAGNOSIS — E782 Mixed hyperlipidemia: Secondary | ICD-10-CM | POA: Diagnosis not present

## 2021-09-22 DIAGNOSIS — I6522 Occlusion and stenosis of left carotid artery: Secondary | ICD-10-CM | POA: Insufficient documentation

## 2021-09-22 DIAGNOSIS — Z8546 Personal history of malignant neoplasm of prostate: Secondary | ICD-10-CM | POA: Diagnosis not present

## 2021-09-22 DIAGNOSIS — I2510 Atherosclerotic heart disease of native coronary artery without angina pectoris: Secondary | ICD-10-CM | POA: Insufficient documentation

## 2021-09-22 DIAGNOSIS — E6609 Other obesity due to excess calories: Secondary | ICD-10-CM | POA: Diagnosis not present

## 2021-09-22 DIAGNOSIS — I6521 Occlusion and stenosis of right carotid artery: Secondary | ICD-10-CM

## 2021-09-22 DIAGNOSIS — I10 Essential (primary) hypertension: Secondary | ICD-10-CM | POA: Diagnosis not present

## 2021-09-22 DIAGNOSIS — K219 Gastro-esophageal reflux disease without esophagitis: Secondary | ICD-10-CM | POA: Diagnosis not present

## 2021-09-22 DIAGNOSIS — Z0000 Encounter for general adult medical examination without abnormal findings: Secondary | ICD-10-CM

## 2021-09-22 DIAGNOSIS — E278 Other specified disorders of adrenal gland: Secondary | ICD-10-CM | POA: Diagnosis not present

## 2021-09-22 LAB — LIPID PANEL
Cholesterol: 134 mg/dL (ref 0–200)
HDL: 44.7 mg/dL (ref >39.00)
LDL Cholesterol: 63 mg/dL (ref 0–99)
NonHDL: 88.85
Total CHOL/HDL Ratio: 3
Triglycerides: 129 mg/dL (ref 0.0–149.0)
VLDL: 25.8 mg/dL (ref 0.0–40.0)

## 2021-09-22 LAB — PSA: PSA: 3.35 ng/mL (ref 0.10–4.00)

## 2021-09-22 NOTE — Assessment & Plan Note (Signed)
Taking protonix 40 mg daily and well controlled. Will continue.  ?

## 2021-09-22 NOTE — Assessment & Plan Note (Signed)
Getting repeat carotid US today.  ?

## 2021-09-22 NOTE — Assessment & Plan Note (Signed)
No further imaging or labs required as not active and stable on imaging. No symptoms of this. ?

## 2021-09-22 NOTE — Progress Notes (Signed)
? ?  Subjective:  ? ?Patient ID: Justin Ayala, male    DOB: 04-17-1951, 71 y.o.   MRN: [id number] ? ?HPI ?The patient is here for physical. ? ?PMH, [facility], social history reviewed and updated ? ?Review of Systems  ?Constitutional: Negative.   ?HENT: Negative.    ?Eyes: Negative.   ?Respiratory:  Negative for cough, chest tightness and shortness of breath.   ?Cardiovascular:  Negative for chest pain, palpitations and leg swelling.  ?Gastrointestinal:  Negative for abdominal distention, abdominal pain, constipation, diarrhea, nausea and vomiting.  ?Musculoskeletal: Negative.   ?Skin: Negative.   ?Neurological: Negative.   ?Psychiatric/Behavioral: Negative.    ? ?Objective:  ?Physical Exam ?Constitutional:   ?   Appearance: He is well-developed.  ?HENT:  ?   Head: Normocephalic and atraumatic.  ?Cardiovascular:  ?   Rate and Rhythm: Normal rate and regular rhythm.  ?Pulmonary:  ?   Effort: Pulmonary effort is normal. No respiratory distress.  ?   Breath sounds: Normal breath sounds. No wheezing or rales.  ?Abdominal:  ?   General: Bowel sounds are normal. There is no distension.  ?   Palpations: Abdomen is soft.  ?   Tenderness: There is no abdominal tenderness. There is no rebound.  ?Musculoskeletal:  ?   Cervical back: Normal range of motion.  ?Skin: ?   General: Skin is warm and dry.  ?Neurological:  ?   Mental Status: He is alert and oriented to person, place, and time.  ?   Coordination: Coordination normal.  ? ? ?Vitals:  ? 09/22/21 1000 09/22/21 1036  ?BP: (!) 142/88 130/78  ?Pulse: 67   ?Temp: 98.2 ?F (36.8 ?C)   ?TempSrc: Oral   ?SpO2: 97%   ?Weight: 216 lb (98 kg)   ?Height: '5\' 7"'$  (1.702 m)   ? ? ?This visit occurred during the SARS-CoV-2 public health emergency.  Safety protocols were in place, including screening questions prior to the visit, additional usage of staff PPE, and extensive cleaning of exam room while observing appropriate contact time as indicated for disinfecting solutions.  ? ?Assessment & Plan:   ? ?

## 2021-09-22 NOTE — Assessment & Plan Note (Addendum)
Checking PSA for stability. Adjust treatment as needed.  ?

## 2021-09-22 NOTE — Assessment & Plan Note (Signed)
Checking lipid panel and adjust zetia 10 mg daily.  

## 2021-09-22 NOTE — Assessment & Plan Note (Signed)
Flu shot yearly. Covid-19 counseled. Pneumonia complete. Shingrix complete. Tetanus up to date. Colonoscopy up to date. Counseled about sun safety and mole surveillance. Counseled about the dangers of distracted driving. Given 10 year screening recommendations.  ? ?

## 2021-09-22 NOTE — Assessment & Plan Note (Signed)
Weight stable and counseled about diet and exercise. ?

## 2021-09-22 NOTE — Assessment & Plan Note (Signed)
BP at goal on hctz 25 mg daily and lisinopril 20 mg daily and metoprolol 12.5 mg BID. Recent CMP normal and without indication for change will continue. ?

## 2021-09-24 ENCOUNTER — Encounter: Payer: Self-pay | Admitting: Cardiovascular Disease

## 2021-09-27 ENCOUNTER — Other Ambulatory Visit: Payer: Self-pay | Admitting: Internal Medicine

## 2021-10-06 ENCOUNTER — Other Ambulatory Visit: Payer: Self-pay | Admitting: Internal Medicine

## 2021-10-22 ENCOUNTER — Other Ambulatory Visit: Payer: Self-pay | Admitting: Internal Medicine

## 2021-10-31 ENCOUNTER — Other Ambulatory Visit: Payer: Self-pay | Admitting: Internal Medicine

## 2021-11-01 ENCOUNTER — Other Ambulatory Visit: Payer: Self-pay | Admitting: Internal Medicine

## 2021-11-02 ENCOUNTER — Encounter: Payer: Self-pay | Admitting: Internal Medicine

## 2021-11-04 ENCOUNTER — Other Ambulatory Visit: Payer: Self-pay

## 2021-11-04 DIAGNOSIS — Z8546 Personal history of malignant neoplasm of prostate: Secondary | ICD-10-CM | POA: Diagnosis not present

## 2021-11-04 MED ORDER — TAMSULOSIN HCL 0.4 MG PO CAPS: 0.4000 mg | ORAL_CAPSULE | Freq: Two times a day (BID) | ORAL | 3 refills | Status: DC

## 2021-11-04 NOTE — Telephone Encounter (Signed)
Ok to refill #180 3 refills

## 2021-11-10 DIAGNOSIS — E349 Endocrine disorder, unspecified: Secondary | ICD-10-CM | POA: Diagnosis not present

## 2021-11-10 DIAGNOSIS — N5235 Erectile dysfunction following radiation therapy: Secondary | ICD-10-CM | POA: Diagnosis not present

## 2021-11-10 DIAGNOSIS — R9721 Rising PSA following treatment for malignant neoplasm of prostate: Secondary | ICD-10-CM | POA: Diagnosis not present

## 2021-11-10 DIAGNOSIS — R3912 Poor urinary stream: Secondary | ICD-10-CM | POA: Diagnosis not present

## 2021-11-13 IMAGING — DX DG LUMBAR SPINE 2-3V
3 series · 3 of 3 positions shown · non-contrast
Comparison: CT abdomen pelvis dated 09/14/2014.

CLINICAL DATA: 69-year-old male with low back pain.

EXAM:
LUMBAR SPINE - 2-3 VIEW

[l-spine ap]
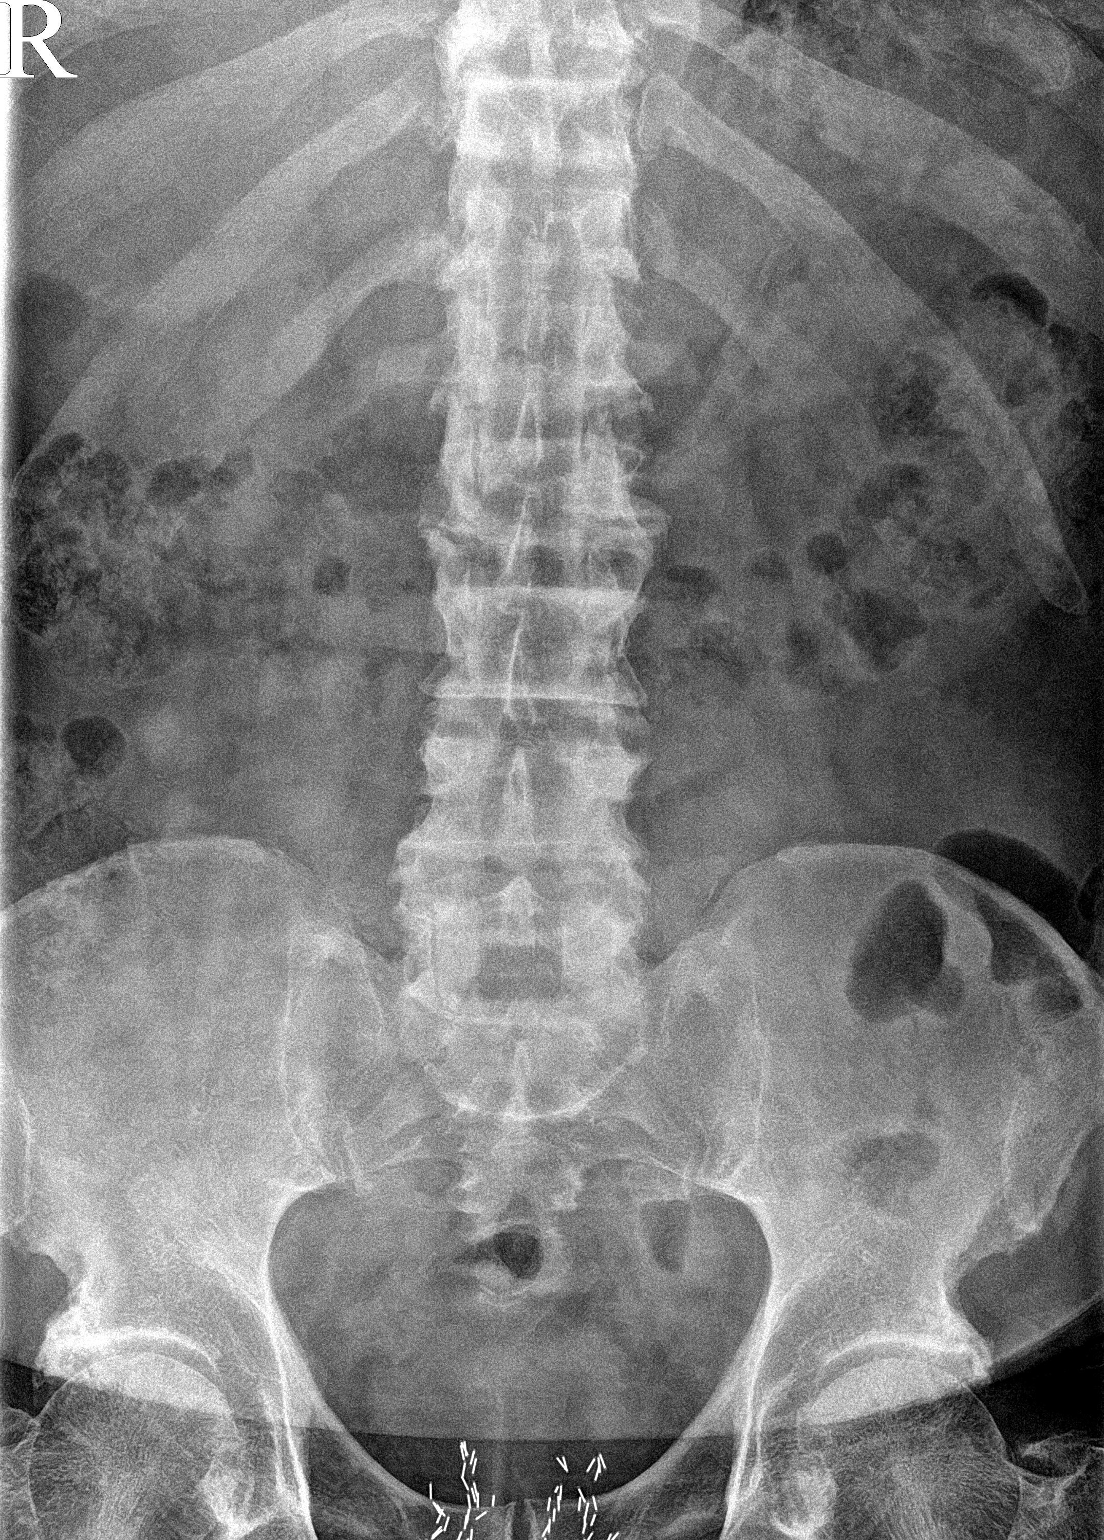

[l-spine lateral (1 of 2)]
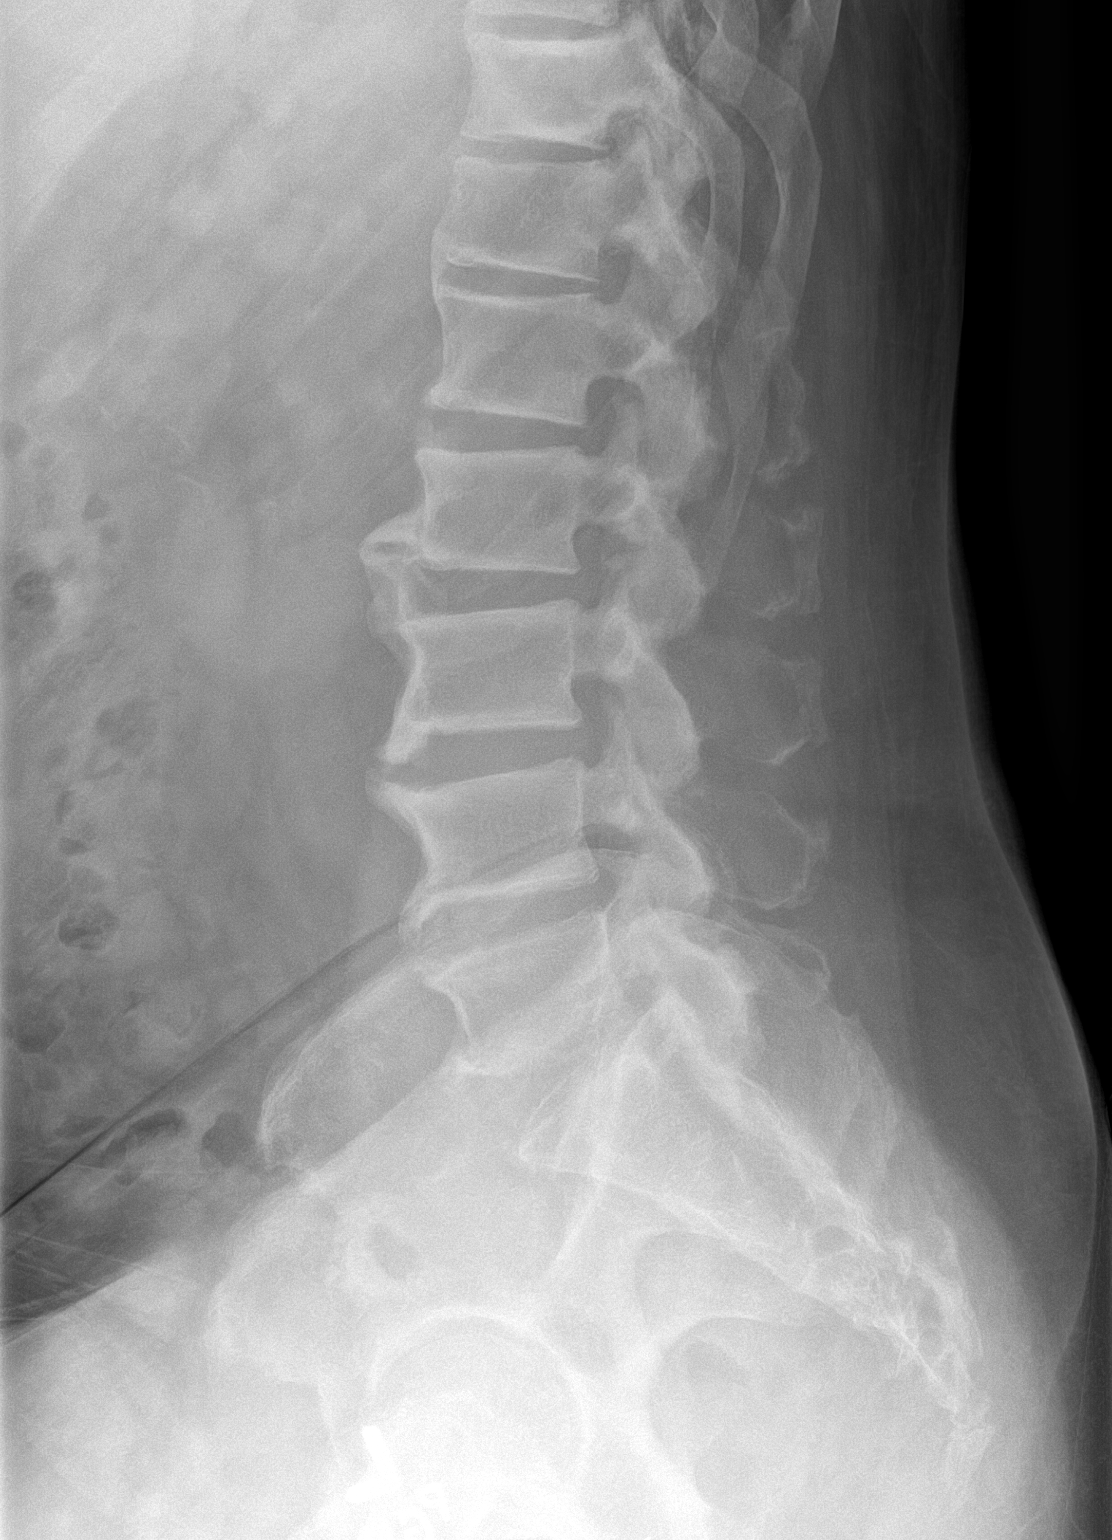

[l-spine lateral (2 of 2)]
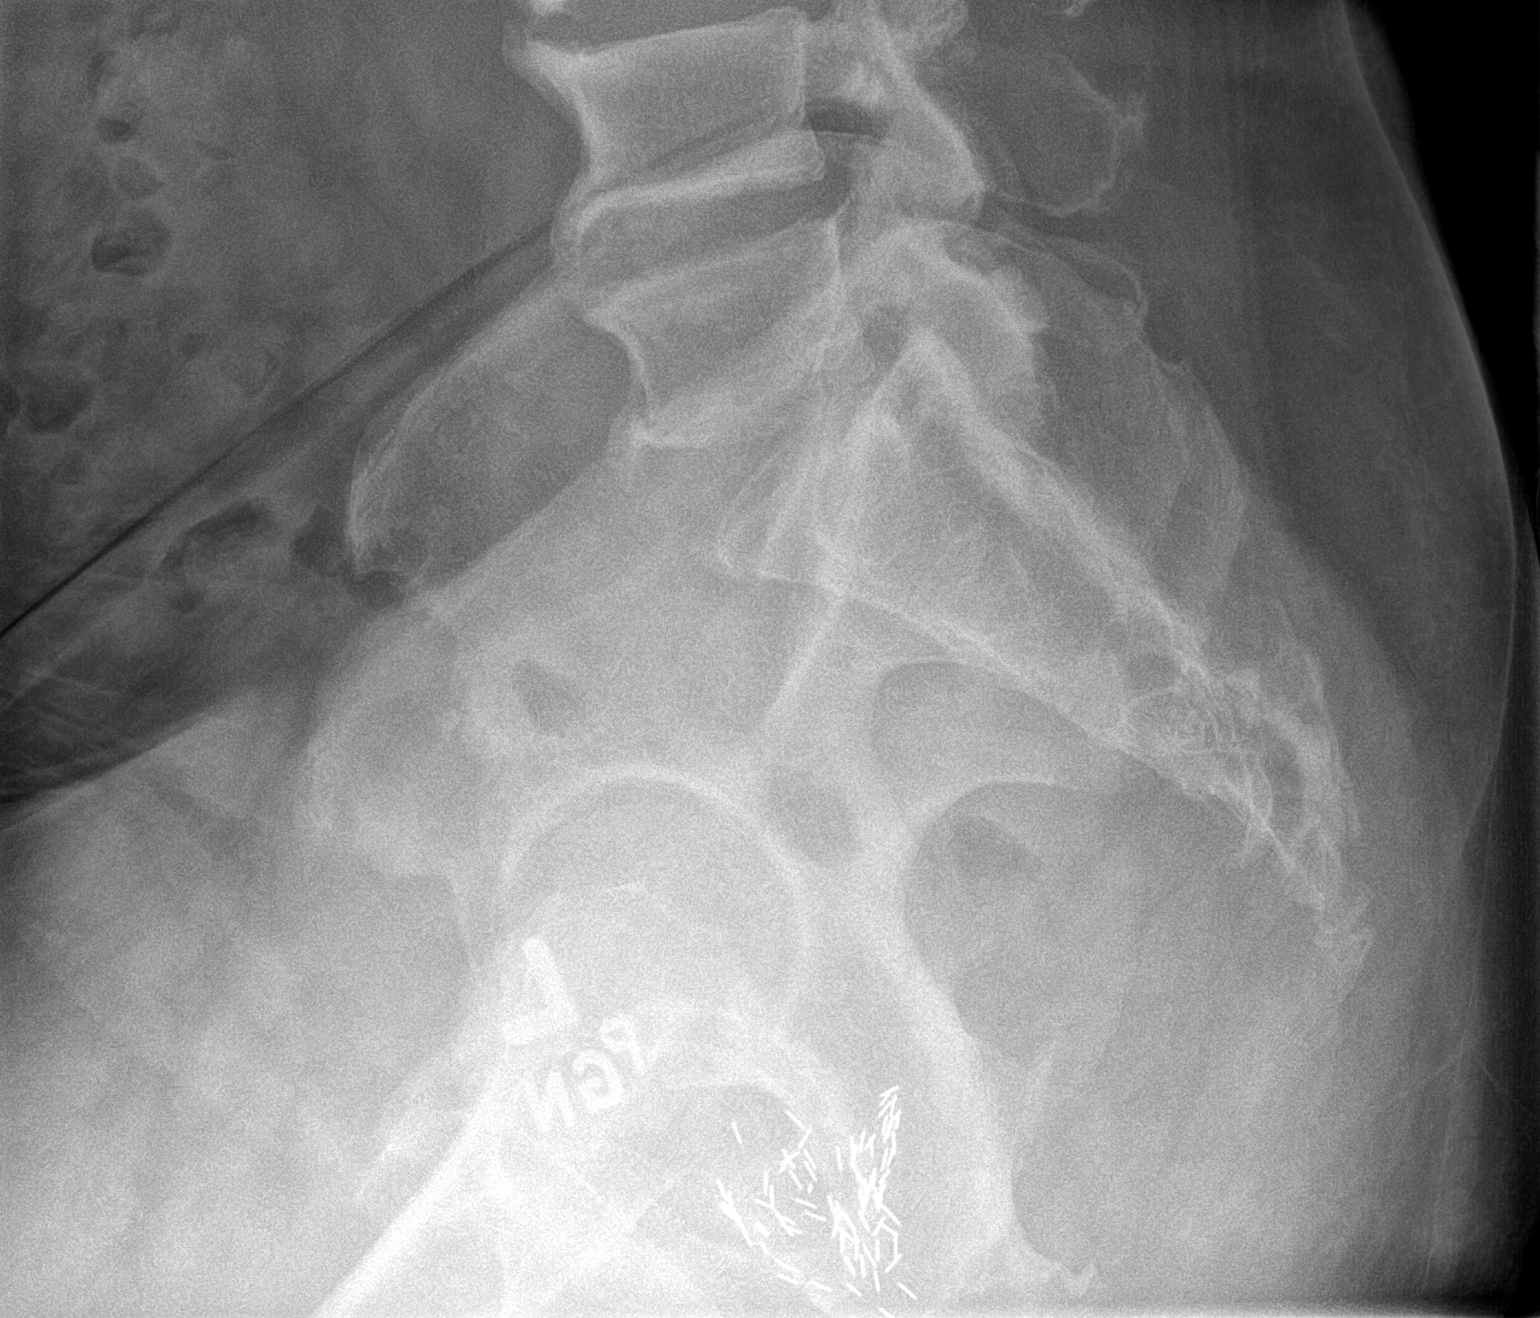

[3 of 3 positions shown; findings below may reference images not displayed]

FINDINGS: Five lumbar type vertebra. There is no acute fracture or subluxation
of the lumbar spine. There are multilevel degenerative changes with
anterior osteophyte. Multilevel facet arthropathy. Prostate
brachytherapy seeds noted. The soft tissues are unremarkable.
IMPRESSION: 1. No acute/traumatic lumbar spine pathology.
2. Multilevel degenerative changes.

## 2021-11-27 ENCOUNTER — Other Ambulatory Visit (HOSPITAL_COMMUNITY): Payer: Self-pay | Admitting: Urology

## 2021-11-27 DIAGNOSIS — C61 Malignant neoplasm of prostate: Secondary | ICD-10-CM

## 2021-12-03 ENCOUNTER — Encounter (HOSPITAL_COMMUNITY)
Admission: RE | Admit: 2021-12-03 | Discharge: 2021-12-03 | Disposition: A | Discharge status: Home / Self Care | Payer: Medicare HMO | Source: Ambulatory Visit | Attending: Urology | Admitting: Urology

## 2021-12-03 DIAGNOSIS — C61 Malignant neoplasm of prostate: Secondary | ICD-10-CM | POA: Insufficient documentation

## 2021-12-03 MED ORDER — PIFLIFOLASTAT F 18 (PYLARIFY) INJECTION
9.0000 | Freq: Once | INTRAVENOUS | Status: AC
  Administered 2021-12-03: 8.2 via INTRAVENOUS

## 2021-12-26 DIAGNOSIS — R3912 Poor urinary stream: Secondary | ICD-10-CM | POA: Diagnosis not present

## 2021-12-26 DIAGNOSIS — N401 Enlarged prostate with lower urinary tract symptoms: Secondary | ICD-10-CM | POA: Diagnosis not present

## 2021-12-26 DIAGNOSIS — C61 Malignant neoplasm of prostate: Secondary | ICD-10-CM | POA: Diagnosis not present

## 2021-12-31 NOTE — Progress Notes (Addendum)
GU Location of Tumor / Histology: Prostate Ca (biochemical recurrence)  If Prostate Cancer, Gleason Score is (3 + 3) and PSA is (3.32 as of 11/04/2021)      12/03/2021 Dr. Jeffie Pollock NM PET Skull To Mid Thigh CLINICAL DATA:  Prostate carcinoma with biochemical recurrence.  RISING PSA, PSA = 3.32 S/P RAI SEEDS (APPROX. 10 YEARS AGO  FINDINGS: NECK No radiotracer activity in neck lymph nodes.   Incidental CT finding: None   CHEST No radiotracer accumulation within mediastinal or hilar lymph nodes.  No suspicious pulmonary nodules on the CT scan.   Incidental CT finding: None   ABDOMEN/PELVIS Prostate: Brachytherapy seed notes. At the right base, small focus of radiotracer activity with SUV max = 16.3 (image 196).   Lymph nodes: No abnormal radiotracer accumulation within pelvic or abdominal nodes.   Liver: No evidence of liver metastasis.  Large benign liver cysts.   Incidental CT finding: Benign left adrenaladenoma.   SKELETON   No focal  activity to suggest skeletal metastasis.   IMPRESSION: 1. Focal activity at the RIGHT base of prostate gland could represent local prostate cancer recurrence. 2. No evidence metastatic adenopathy in the pelvis or periaortic retroperitoneum. 3. No evidence of visceral metastasis or skeletal metastasis.    Past/Anticipated interventions by urology, if any:     Past/Anticipated interventions by medical oncology, if any: NA  Weight changes, if any: No  IPPS:  0 SHIM:  15   Bowel/Bladder complaints, if any:  No  Nausea/Vomiting, if any: No  Pain issues, if any:  0/10  SAFETY ISSUES: Prior radiation?  Brachytherapy Pacemaker/ICD? No Possible current pregnancy? Male Is the patient on methotrexate? No  Current Complaints / other details:

## 2022-01-02 ENCOUNTER — Ambulatory Visit
Admission: RE | Admit: 2022-01-02 | Discharge: 2022-01-02 | Disposition: A | Discharge status: Home / Self Care | Payer: Medicare HMO | Source: Ambulatory Visit | Attending: Radiation Oncology | Admitting: Radiation Oncology

## 2022-01-02 ENCOUNTER — Other Ambulatory Visit: Payer: Self-pay

## 2022-01-02 VITALS — BP 151/83 | HR 70 | Temp 97.8°F | Resp 18 | Ht 67.0 in | Wt 212.1 lb

## 2022-01-02 DIAGNOSIS — E785 Hyperlipidemia, unspecified: Secondary | ICD-10-CM | POA: Diagnosis not present

## 2022-01-02 DIAGNOSIS — K219 Gastro-esophageal reflux disease without esophagitis: Secondary | ICD-10-CM | POA: Insufficient documentation

## 2022-01-02 DIAGNOSIS — Z7982 Long term (current) use of aspirin: Secondary | ICD-10-CM | POA: Insufficient documentation

## 2022-01-02 DIAGNOSIS — C61 Malignant neoplasm of prostate: Secondary | ICD-10-CM

## 2022-01-02 DIAGNOSIS — Z87442 Personal history of urinary calculi: Secondary | ICD-10-CM | POA: Diagnosis not present

## 2022-01-02 DIAGNOSIS — Z9049 Acquired absence of other specified parts of digestive tract: Secondary | ICD-10-CM | POA: Insufficient documentation

## 2022-01-02 DIAGNOSIS — I10 Essential (primary) hypertension: Secondary | ICD-10-CM | POA: Diagnosis not present

## 2022-01-02 DIAGNOSIS — I2510 Atherosclerotic heart disease of native coronary artery without angina pectoris: Secondary | ICD-10-CM | POA: Insufficient documentation

## 2022-01-02 DIAGNOSIS — Z791 Long term (current) use of non-steroidal anti-inflammatories (NSAID): Secondary | ICD-10-CM | POA: Insufficient documentation

## 2022-01-02 DIAGNOSIS — Z79899 Other long term (current) drug therapy: Secondary | ICD-10-CM | POA: Diagnosis not present

## 2022-01-02 DIAGNOSIS — Z191 Hormone sensitive malignancy status: Secondary | ICD-10-CM | POA: Diagnosis not present

## 2022-01-02 NOTE — Progress Notes (Signed)
Radiation Oncology         [phone] ________________________________  Initial Outpatient Consultation  Name: Justin Ayala MRN: [id number]  Date: 01/02/2022  DOB: 09/18/1950  [id number], Real Cons, MD  Irine Seal, MD   REFERRING PHYSICIAN: Irine Seal, MD  DIAGNOSIS: 71 y.o. gentleman with locally recurrent prostate cancer s/p brachytherapy in 2013 for T1c, Gleason 3+3 adenocarcinoma.    ICD-10-CM   1. Malignant neoplasm of prostate ([place])  C61     2. Recurrent prostate adenocarcinoma ([place])  C61       HISTORY OF PRESENT ILLNESS: Justin Ayala is a 71 y.o. male with a diagnosis of recurrent prostate cancer.  He had brachytherapy in February 2013 for treatment of T1c, Gleason 3+3 adenocarcinoma of the prostate with a pretreatment PSA of 5.0.  His PSA nadired at 0.33 in 2015 but has been very slowly rising since that time, although it seemed to level off around 2.6 from 20 21-20 22.  His most recent PSA was 3.32 on 11/04/2021 and this prompted a PSMA PET scan.  The PET was performed on 12/03/2021 showing a small focus of activity at the right base but no evidence of lymph node involvement or osseous metastases.  He had been on TRT with androgel daily but this was discontinued in June 2023.  The patient reviewed the PSA and PSMA PET results with his urologist and he has kindly been referred today for discussion of potential radiation treatment options.   PREVIOUS RADIATION THERAPY: No  PAST MEDICAL HISTORY:  Past Medical History:  Diagnosis Date   Allergic rhinitis    Anemia 2010   CAD (coronary artery disease)    60-70% prox 1st diagonal branch stenosis, 50% dominant circumflex stenosis in PDA, normal LV function (by 10/12/2008 cath)   GERD (gastroesophageal reflux disease)    History of cardiovascular stress test 10/02/2008   exercise tolerance test - abnormal test - subsequent cath    HLD (hyperlipidemia)    HTN (hypertension)    Kidney stones    Overweight(278.02)     Peptic ulcer    Prostate cancer ([place])       PAST SURGICAL HISTORY: Past Surgical History:  Procedure Laterality Date   APPENDECTOMY  12/2008   Dr. Clyda Greener   BIOPSY PROSTATE  03/19/11   gleason 3+3=6, volume 34 cc, psa 01/06/11 5.00   CARDIAC CATHETERIZATION  2010   non-critical stenosis   FLEXIBLE SIGMOIDOSCOPY N/A 08/18/2013   Procedure: FLEXIBLE SIGMOIDOSCOPY;  Surgeon: Beryle Beams, MD;  Location: [facility];  Service: Endoscopy;  Laterality: N/A;   HOT HEMOSTASIS N/A 08/18/2013   Procedure: HOT HEMOSTASIS (ARGON PLASMA COAGULATION/BICAP);  Surgeon: Beryle Beams, MD;  Location: [facility];  Service: Endoscopy;  Laterality: N/A;  AVM - sig colon   RADIOACTIVE SEED IMPLANT  08/14/2011   Procedure: RADIOACTIVE SEED IMPLANT;  Surgeon: Claybon Jabs, MD;  Location: [facility];  Service: Urology;  Laterality: N/A;  50  SEEDS IMPLANTED IN PROSTATE   TONSILLECTOMY      FAMILY HISTORY:  Family History  Problem Relation Age of Onset   Hypertension Father        also organ failure   Stroke Father    Hypertension Mother    Cervical cancer Mother    Prostate cancer Brother        seed implant   Heart failure Paternal Grandmother    Heart failure Paternal Grandfather    Diabetes Neg Hx     SOCIAL  HISTORY:  Social History   Socioeconomic History   Marital status: Married    Spouse name: Not on file   Number of children: 3   Years of education: BS    Highest education level: Not on file  Occupational History    Employer: [employer]  Tobacco Use   Smoking status: Never   Smokeless tobacco: Never  Substance and Sexual Activity   Alcohol use: No   Drug use: No   Sexual activity: Yes    Partners: Female  Other Topics Concern   Not on file  Social History Narrative   married x 30+years; 3 children, 4 grandsons    no smoking or ETOH;    Retired Merchant navy officer formerly at [employer];    likes to golf (golfs 3-4 times a week); no exercise and rides  cart while golfing         Social Determinants of Health   Financial Resource Strain: Low Risk  (09/19/2021)   Overall Financial Resource Strain (CARDIA)    Difficulty of Paying Living Expenses: Not hard at all  Food Insecurity: No Food Insecurity (09/19/2021)   Hunger Vital Sign    Worried About Running Out of Food in the Last Year: Never true    Ran Out of Food in the Last Year: Never true  Transportation Needs: No Transportation Needs (09/19/2021)   PRAPARE - Hydrologist (Medical): No    Lack of Transportation (Non-Medical): No  Physical Activity: Sufficiently Active (09/19/2021)   Exercise Vital Sign    Days of Exercise per Week: 5 days    Minutes of Exercise per Session: 30 min  Stress: No Stress Concern Present (09/19/2021)   [place]    Feeling of Stress : Not at all  Social Connections: [place] (09/19/2021)   Social Connection and Isolation Panel [NHANES]    Frequency of Communication with Friends and Family: More than three times a week    Frequency of Social Gatherings with Friends and Family: More than three times a week    Attends Religious Services: More than 4 times per year    Active Member of [employer] or Organizations: Yes    Attends Archivist Meetings: More than 4 times per year    Marital Status: Married  Human resources officer Violence: Not At Risk (09/19/2021)   Humiliation, Afraid, Rape, and Kick questionnaire    Fear of Current or Ex-Partner: No    Emotionally Abused: No    Physically Abused: No    Sexually Abused: No    ALLERGIES: Lipitor [atorvastatin]  MEDICATIONS:  Current Outpatient Medications  Medication Sig Dispense Refill   ASPIRIN ADULT LOW STRENGTH 81 MG EC tablet TAKE 1 BY MOUTH DAILY 90 tablet 0   Cholecalciferol (VITAMIN D-3) 1000 UNITS CAPS Take by mouth. Take one daily     Cranberry 125 MG TABS Take 1 tablet by mouth 2 (two) times  a week.     ezetimibe (ZETIA) 10 MG tablet Take 1 tablet by mouth once daily 90 tablet 0   ferrous sulfate 325 (65 FE) MG tablet Take 1 tablet (325 mg total) by mouth daily. 90 tablet 3   Glucosamine-Chondroitin (OSTEO BI-FLEX REGULAR STRENGTH PO) Take 1 tablet by mouth daily.     hydrochlorothiazide (HYDRODIURIL) 25 MG tablet Take 1 tablet by mouth once daily 90 tablet 0   [place] Ginseng 100 MG CAPS Take 1 capsule (100 mg total) by mouth daily.  90 each 0   lisinopril (ZESTRIL) 20 MG tablet Take 1 tablet by mouth once daily 60 tablet 9   meloxicam (MOBIC) 7.5 MG tablet Take 1 tablet by mouth once daily 90 tablet 0   metoprolol tartrate (LOPRESSOR) 25 MG tablet Take 1/2 (one-half) tablet by mouth twice daily 90 tablet 0   pantoprazole (PROTONIX) 40 MG tablet TAKE 1 TABLET BY MOUTH ONCE DAILY . 90 tablet 2   REPATHA PUSHTRONEX SYSTEM 420 MG/3.5ML SOCT INJECT 420 MG (3.5 MLS) INTO THE SKIN EVERY 30 DAYS 4 mL 5   Resveratrol 100 MG CAPS Take 1 capsule by mouth daily.     tamsulosin (FLOMAX) 0.4 MG CAPS capsule Take 1 capsule (0.4 mg total) by mouth 2 (two) times daily. 180 capsule 3   No current facility-administered medications for this encounter.    REVIEW OF SYSTEMS:  On review of systems, the patient reports that he is doing well overall. He denies any chest pain, shortness of breath, cough, fevers, chills, night sweats, unintended weight changes. He denies any bowel disturbances, and denies abdominal pain, nausea or vomiting. He denies any new musculoskeletal or joint aches or pains. His IPSS was 0, indicating no urinary symptoms. His SHIM was 15, indicating he has mild-moderate erectile dysfunction.  He is currently having FIRM shockwave therapy for erectile dysfunction in the urology office.  A complete review of systems is obtained and is otherwise negative.    PHYSICAL EXAM:  Wt Readings from Last 3 Encounters:  01/02/22 212 lb 2 oz (96.2 kg)  12/03/21 206 lb (93.4 kg)  09/22/21 216 lb  (98 kg)   Temp Readings from Last 3 Encounters:  01/02/22 97.8 F (36.6 C) (Oral)  09/22/21 98.2 F (36.8 C) (Oral)  08/25/21 97.6 F (36.4 C) (Oral)   BP Readings from Last 3 Encounters:  01/02/22 (!) 151/83  09/22/21 130/78  09/10/21 138/82   Pulse Readings from Last 3 Encounters:  01/02/22 70  09/22/21 67  09/10/21 67   Pain Assessment Pain Score: 0-No pain/10  In general this is a well appearing African-American male in no acute distress. He's alert and oriented x4 and appropriate throughout the examination. Cardiopulmonary assessment is negative for acute distress, and he exhibits normal effort.     KPS = 100  100 - Normal; no complaints; no evidence of disease. 90   - Able to carry on normal activity; minor signs or symptoms of disease. 80   - Normal activity with effort; some signs or symptoms of disease. 72   - Cares for self; unable to carry on normal activity or to do active work. 60   - Requires occasional assistance, but is able to care for most of his personal needs. 50   - Requires considerable assistance and frequent medical care. 46   - Disabled; requires special care and assistance. 22   - Severely disabled; hospital admission is indicated although death not imminent. 21   - Very sick; hospital admission necessary; active supportive treatment necessary. 10   - Moribund; fatal processes progressing rapidly. 0     - Dead  Karnofsky DA, Abelmann [place], Craver LS and Burchenal [facility] [phone]) The use of the nitrogen mustards in the palliative treatment of carcinoma: with particular reference to bronchogenic carcinoma Cancer 1 634-56  LABORATORY DATA:  Lab Results  Component Value Date   WBC 5.4 08/25/2021   HGB 13.2 08/25/2021   HCT 40.6 08/25/2021   MCV 86.8 08/25/2021   PLT 256 08/25/2021  Lab Results  Component Value Date   NA 140 08/25/2021   K 3.8 08/25/2021   CL 108 08/25/2021   CO2 25 08/25/2021   Lab Results  Component Value Date   ALT 19  08/25/2021   AST 20 08/25/2021   ALKPHOS 71 08/25/2021   BILITOT 0.7 08/25/2021     RADIOGRAPHY: NM PET (PSMA) SKULL TO MID THIGH  Result Date: 12/04/2021 CLINICAL DATA:  Prostate carcinoma with biochemical recurrence. RISING PSA, PSA = 3.32 S/P RAI SEEDS (APPROX. 10 YEARS AGO) EXAM: NUCLEAR MEDICINE PET SKULL BASE TO THIGH TECHNIQUE: 8.2 mCi F18 Piflufolastat (Pylarify) was injected intravenously. Full-ring PET imaging was performed from the skull base to thigh after the radiotracer. CT data was obtained and used for attenuation correction and anatomic localization. COMPARISON:  Axumin PET scan 11/15/2018 FINDINGS: NECK No radiotracer activity in neck lymph nodes. Incidental CT finding: None CHEST No radiotracer accumulation within mediastinal or hilar lymph nodes. No suspicious pulmonary nodules on the CT scan. Incidental CT finding: None ABDOMEN/PELVIS Prostate: Brachytherapy seed notes. At the right base, small focus of radiotracer activity with SUV max = 16.3 (image 196). Lymph nodes: No abnormal radiotracer accumulation within pelvic or abdominal nodes. Liver: No evidence of liver metastasis.  Large benign liver cysts. Incidental CT finding: Benign left adrenaladenoma. SKELETON No focal  activity to suggest skeletal metastasis. IMPRESSION: 1. Focal activity at the RIGHT base of prostate gland could represent local prostate cancer recurrence. 2. No evidence metastatic adenopathy in the pelvis or periaortic retroperitoneum. 3. No evidence of visceral metastasis or skeletal metastasis. Electronically Signed   By: Suzy Bouchard M.D.   On: 12/04/2021 16:32      IMPRESSION/PLAN: 1. 71 y.o. gentleman with locally recurrent prostate cancer s/p brachytherapy in 2013 for T1c, Gleason 3+3 adenocarcinoma. Today, we talked to the patient about the findings and workup thus far.  We reviewed the recent PSMA PET results which did show focal activity at the right base of the prostate gland which could represent  local prostate cancer recurrence but is not definitive.  Ideally, we would get an MRI fusion targeted prostate biopsy to confirm that this is indeed recurrent prostate cancer but in discussing this with the patient, he is adamantly not interested in biopsy and instead, prefers to proceed with treatment based on the PSMA PET results.  We discussed the risks and benefits of proceeding with treatment without tissue confirmation and he accepts the risks, including but not limited to, not achieving our goal of curative treatment.  We discussed the natural history of recurrent prostate cancer and general treatment, highlighting the role of radiotherapy in the management. We discussed the available radiation techniques, and focused on the details and logistics of delivery.  The patient is in favor of proceeding with a 5 fraction course of stereotactic body radiotherapy  (SBRT) targeting the PET positive focus in the right base of the prostate gland.  We reviewed the anticipated acute and late sequelae associated with radiation in this setting. The patient was encouraged to ask questions that were answered to his stated satisfaction.  At the conclusion of our conversation, the patient is interested in moving forward with a 5 fraction course of stereotactic body radiotherapy  (SBRT) targeting the PET positive focus in the right base of the prostate gland.  We will share our discussion with Dr. Jeffie Pollock and coordinate for CT simulation/treatment planning next week, in anticipation of beginning his treatments in the very near future.  We enjoyed meeting with  him again today and look forward to continuing to participate in his care.  We personally spent 70 minutes in this encounter including chart review, reviewing radiological studies, meeting face-to-face with the patient, entering orders and completing documentation.    Nicholos Johns, PA-C    Tyler Pita, MD  [facility] Direct Dial:  [phone]  Fax: [phone] [email or web address]  Skype  [email or web address]

## 2022-01-02 NOTE — Progress Notes (Signed)
Introduced myself to the patient as the prostate nurse navigator.  No barriers to care identified at this time.  He is here to discuss his radiation treatment options.  I gave him my business card and asked him to call me with questions or concerns.  Verbalized understanding.  ?

## 2022-01-03 DIAGNOSIS — C61 Malignant neoplasm of prostate: Secondary | ICD-10-CM | POA: Diagnosis not present

## 2022-01-03 DIAGNOSIS — Z191 Hormone sensitive malignancy status: Secondary | ICD-10-CM | POA: Diagnosis not present

## 2022-01-05 NOTE — Progress Notes (Signed)
  Radiation Oncology         [phone]) [phone] ________________________________  Name: Justin Ayala MRN: [id number]  Date: 01/06/2022  DOB: Sep 28, 1950  STEREOTACTIC BODY RADIOTHERAPY SIMULATION AND TREATMENT PLANNING NOTE    ICD-10-CM   1. H/O prostate cancer  Z85.46     2. Recurrent prostate adenocarcinoma ([name])  C61       DIAGNOSIS:   71 y.o. gentleman with locally recurrent prostate cancer s/p brachytherapy in 2013 for T1c, Gleason 3+3 adenocarcinoma.  NARRATIVE:  The patient was brought to the [facility].  Identity was confirmed.  All relevant records and images related to the planned course of therapy were reviewed.  The patient freely provided informed written consent to proceed with treatment after reviewing the details related to the planned course of therapy. The consent form was witnessed and verified by the simulation staff.  Then, the patient was set-up in a stable reproducible  supine position for radiation therapy.  A BodyFix immobilization pillow was fabricated for reproducible positioning.  Surface markings were placed.  The CT images were loaded into the planning software.  The gross target volumes (GTV) and planning target volumes (PTV) were delinieated, and avoidance structures were contoured.  Treatment planning then occurred.  The radiation prescription was entered and confirmed.  A total of two complex treatment devices were fabricated in the form of the BodyFix immobilization pillow and a neck accuform cushion.  I have requested : 3D Simulation  I have requested a DVH of the following structures: targets and all normal structures near the target including bladder, rectum, urethra and penile bulb as noted on the radiation plan to maintain doses in adherence with established limits  SPECIAL TREATMENT PROCEDURE:  The planned course of therapy using radiation constitutes a special treatment procedure. Special care is required in the management of this patient for  the following reasons. High dose per fraction requiring special monitoring for increased toxicities of treatment including daily imaging..  The special nature of the planned course of radiotherapy will require increased physician supervision and oversight to ensure patient's safety with optimal treatment outcomes.    This requires extended time and effort.    PLAN:  The patient will receive 36.25 Gy in 5 fractions.  ________________________________  Sheral Apley Tammi Klippel, M.D.

## 2022-01-06 ENCOUNTER — Other Ambulatory Visit: Payer: Self-pay

## 2022-01-06 ENCOUNTER — Ambulatory Visit
Admission: RE | Admit: 2022-01-06 | Discharge: 2022-01-06 | Disposition: A | Discharge status: Home / Self Care | Payer: Medicare HMO | Source: Ambulatory Visit | Attending: Radiation Oncology | Admitting: Radiation Oncology

## 2022-01-06 DIAGNOSIS — Z191 Hormone sensitive malignancy status: Secondary | ICD-10-CM | POA: Diagnosis not present

## 2022-01-06 DIAGNOSIS — C61 Malignant neoplasm of prostate: Secondary | ICD-10-CM | POA: Insufficient documentation

## 2022-01-06 DIAGNOSIS — Z8546 Personal history of malignant neoplasm of prostate: Secondary | ICD-10-CM

## 2022-01-10 ENCOUNTER — Other Ambulatory Visit: Payer: Self-pay | Admitting: Internal Medicine

## 2022-01-13 DIAGNOSIS — Z191 Hormone sensitive malignancy status: Secondary | ICD-10-CM | POA: Diagnosis not present

## 2022-01-13 DIAGNOSIS — C61 Malignant neoplasm of prostate: Secondary | ICD-10-CM | POA: Diagnosis not present

## 2022-01-19 ENCOUNTER — Other Ambulatory Visit: Payer: Self-pay

## 2022-01-19 ENCOUNTER — Ambulatory Visit
Admission: RE | Admit: 2022-01-19 | Discharge: 2022-01-19 | Disposition: A | Discharge status: Home / Self Care | Payer: Medicare HMO | Source: Ambulatory Visit | Attending: Radiation Oncology | Admitting: Radiation Oncology

## 2022-01-19 DIAGNOSIS — C61 Malignant neoplasm of prostate: Secondary | ICD-10-CM | POA: Diagnosis not present

## 2022-01-19 LAB — RAD ONC ARIA SESSION SUMMARY
Course Elapsed Days: 0
Plan Fractions Treated to Date: 1
Plan Name: 3
Plan Prescribed Dose Per Fraction: 7.25 Gy
Plan Total Fractions Prescribed: 5
Plan Total Prescribed Dose: 36.25 Gy
Reference Point Dosage Given to Date: 7.25 Gy
Reference Point Session Dosage Given: 7.25 Gy
Session Number: 1

## 2022-01-20 ENCOUNTER — Ambulatory Visit: Payer: Medicare HMO | Admitting: Radiation Oncology

## 2022-01-21 ENCOUNTER — Other Ambulatory Visit: Payer: Self-pay

## 2022-01-21 ENCOUNTER — Ambulatory Visit
Admission: RE | Admit: 2022-01-21 | Discharge: 2022-01-21 | Disposition: A | Discharge status: Home / Self Care | Payer: Medicare HMO | Source: Ambulatory Visit | Attending: Radiation Oncology | Admitting: Radiation Oncology

## 2022-01-21 DIAGNOSIS — C61 Malignant neoplasm of prostate: Secondary | ICD-10-CM

## 2022-01-21 LAB — RAD ONC ARIA SESSION SUMMARY
Course Elapsed Days: 2
Plan Fractions Treated to Date: 2
Plan Name: 3
Plan Prescribed Dose Per Fraction: 7.25 Gy
Plan Total Fractions Prescribed: 5
Plan Total Prescribed Dose: 36.25 Gy
Reference Point Dosage Given to Date: 14.5 Gy
Reference Point Session Dosage Given: 7.25 Gy
Session Number: 2

## 2022-01-22 ENCOUNTER — Ambulatory Visit: Payer: Medicare HMO | Admitting: Radiation Oncology

## 2022-01-23 ENCOUNTER — Ambulatory Visit
Admission: RE | Admit: 2022-01-23 | Discharge: 2022-01-23 | Disposition: A | Discharge status: Home / Self Care | Payer: Medicare HMO | Source: Ambulatory Visit | Attending: Radiation Oncology | Admitting: Radiation Oncology

## 2022-01-23 ENCOUNTER — Other Ambulatory Visit: Payer: Self-pay

## 2022-01-23 DIAGNOSIS — C61 Malignant neoplasm of prostate: Secondary | ICD-10-CM | POA: Insufficient documentation

## 2022-01-23 LAB — RAD ONC ARIA SESSION SUMMARY
Course Elapsed Days: 4
Plan Fractions Treated to Date: 3
Plan Name: 3
Plan Prescribed Dose Per Fraction: 7.25 Gy
Plan Total Fractions Prescribed: 5
Plan Total Prescribed Dose: 36.25 Gy
Reference Point Dosage Given to Date: 21.75 Gy
Reference Point Session Dosage Given: 7.25 Gy
Session Number: 3

## 2022-01-24 ENCOUNTER — Other Ambulatory Visit: Payer: Self-pay | Admitting: Internal Medicine

## 2022-01-27 ENCOUNTER — Other Ambulatory Visit: Payer: Self-pay

## 2022-01-27 ENCOUNTER — Other Ambulatory Visit: Payer: Self-pay | Admitting: Internal Medicine

## 2022-01-27 ENCOUNTER — Ambulatory Visit
Admission: RE | Admit: 2022-01-27 | Discharge: 2022-01-27 | Disposition: A | Discharge status: Home / Self Care | Payer: Medicare HMO | Source: Ambulatory Visit | Attending: Radiation Oncology | Admitting: Radiation Oncology

## 2022-01-27 DIAGNOSIS — C61 Malignant neoplasm of prostate: Secondary | ICD-10-CM

## 2022-01-27 LAB — RAD ONC ARIA SESSION SUMMARY
Course Elapsed Days: 8
Plan Fractions Treated to Date: 4
Plan Name: 3
Plan Prescribed Dose Per Fraction: 7.25 Gy
Plan Total Fractions Prescribed: 5
Plan Total Prescribed Dose: 36.25 Gy
Reference Point Dosage Given to Date: 29 Gy
Reference Point Session Dosage Given: 7.25 Gy
Session Number: 4

## 2022-01-29 ENCOUNTER — Other Ambulatory Visit: Payer: Self-pay

## 2022-01-29 ENCOUNTER — Ambulatory Visit
Admission: RE | Admit: 2022-01-29 | Discharge: 2022-01-29 | Disposition: A | Discharge status: Home / Self Care | Payer: Medicare HMO | Source: Ambulatory Visit | Attending: Radiation Oncology | Admitting: Radiation Oncology

## 2022-01-29 ENCOUNTER — Encounter: Payer: Self-pay | Admitting: Urology

## 2022-01-29 DIAGNOSIS — Z510 Encounter for antineoplastic radiation therapy: Secondary | ICD-10-CM | POA: Diagnosis not present

## 2022-01-29 DIAGNOSIS — Z191 Hormone sensitive malignancy status: Secondary | ICD-10-CM | POA: Diagnosis not present

## 2022-01-29 DIAGNOSIS — C61 Malignant neoplasm of prostate: Secondary | ICD-10-CM

## 2022-01-29 LAB — RAD ONC ARIA SESSION SUMMARY
Course Elapsed Days: 10
Plan Fractions Treated to Date: 5
Plan Name: 3
Plan Prescribed Dose Per Fraction: 7.25 Gy
Plan Total Fractions Prescribed: 5
Plan Total Prescribed Dose: 36.25 Gy
Reference Point Dosage Given to Date: 36.25 Gy
Reference Point Session Dosage Given: 7.25 Gy
Session Number: 5

## 2022-02-09 ENCOUNTER — Encounter: Payer: Self-pay | Admitting: Internal Medicine

## 2022-02-09 ENCOUNTER — Ambulatory Visit (INDEPENDENT_AMBULATORY_CARE_PROVIDER_SITE_OTHER): Payer: Medicare HMO | Admitting: Internal Medicine

## 2022-02-09 DIAGNOSIS — L989 Disorder of the skin and subcutaneous tissue, unspecified: Secondary | ICD-10-CM | POA: Diagnosis not present

## 2022-02-09 MED ORDER — CEPHALEXIN 500 MG PO CAPS: 500.0000 mg | ORAL_CAPSULE | Freq: Two times a day (BID) | ORAL | 0 refills | Status: AC

## 2022-02-09 NOTE — Assessment & Plan Note (Signed)
Suspect small abscess left shoulder. Rx 5 day keflex and advised to use heat and massage to help flatten lesion.

## 2022-02-09 NOTE — Patient Instructions (Signed)
We have sent in keflex to take 1 pill twice a day for 5 days. 

## 2022-02-09 NOTE — Progress Notes (Signed)
   Subjective:   Patient ID: Justin Ayala, male    DOB: Mar 04, 1951, 71 y.o.   MRN: [id number]  HPI The patient is a 71 YO man coming in for itching/swelling left shoulder  Review of Systems  Constitutional: Negative.   HENT: Negative.    Eyes: Negative.   Respiratory:  Negative for cough, chest tightness and shortness of breath.   Cardiovascular:  Negative for chest pain, palpitations and leg swelling.  Gastrointestinal:  Negative for abdominal distention, abdominal pain, constipation, diarrhea, nausea and vomiting.  Musculoskeletal: Negative.   Skin:  Positive for color change.  Neurological: Negative.   Psychiatric/Behavioral: Negative.      Objective:  Physical Exam Constitutional:      Appearance: He is well-developed.  HENT:     Head: Normocephalic and atraumatic.  Cardiovascular:     Rate and Rhythm: Normal rate and regular rhythm.  Pulmonary:     Effort: Pulmonary effort is normal. No respiratory distress.     Breath sounds: Normal breath sounds. No wheezing or rales.  Abdominal:     General: Bowel sounds are normal. There is no distension.     Palpations: Abdomen is soft.     Tenderness: There is no abdominal tenderness. There is no rebound.  Musculoskeletal:     Cervical back: Normal range of motion.  Skin:    General: Skin is warm and dry.     Comments: 1 cm circular nodule left shoulder freely mobile some minimal fluctuance  Neurological:     Mental Status: He is alert and oriented to person, place, and time.     Coordination: Coordination normal.     Vitals:   02/09/22 0940  BP: 122/88  Pulse: 72  Temp: 98.6 F (37 C)  SpO2: 99%  Weight: 216 lb (98 kg)  Height: '5\' 7"'$  (1.702 m)    Assessment & Plan:

## 2022-02-16 ENCOUNTER — Other Ambulatory Visit: Payer: Self-pay | Admitting: Internal Medicine

## 2022-03-03 ENCOUNTER — Other Ambulatory Visit: Payer: Self-pay | Admitting: Cardiovascular Disease

## 2022-03-03 DIAGNOSIS — I2510 Atherosclerotic heart disease of native coronary artery without angina pectoris: Secondary | ICD-10-CM

## 2022-03-03 DIAGNOSIS — E782 Mixed hyperlipidemia: Secondary | ICD-10-CM

## 2022-03-17 NOTE — Progress Notes (Signed)
  Radiation Oncology         [phone]) [phone] ________________________________  Name: Justin Ayala MRN: [id number]  Date of Service: 03/18/2022  DOB: 11/28/1950  Post Treatment Telephone Note  Diagnosis:  71 y.o. gentleman with locally recurrent prostate cancer s/p brachytherapy in 2013 for T1c, Gleason 3+3 adenocarcinoma. (as documented in provider EOT note)   Pre Treatment IPSS Score: 7-mild- (as documented in the provider consult note)   The patient was available for call today.   Symptoms of fatigue have improved since completing therapy.  Symptoms of bladder changes have improved since completing therapy. Current symptoms include NONE, and medications for bladder symptoms include Tamsulosin (Flomax).  Symptoms of bowel changes have improved since completing therapy. Current symptoms include moderate constipation, and medications for bowel symptoms include None.     Post Treatment IPSS Score: IPSS Questionnaire (AUA-7): Over the past month.   1)  How often have you had a sensation of not emptying your bladder completely after you finish urinating?  2 - Less than half the time  2)  How often have you had to urinate again less than two hours after you finished urinating? 0 - Not at all  3)  How often have you found you stopped and started again several times when you urinated?  1 - Less than 1 time in 5  4) How difficult have you found it to postpone urination?  1 - Less than 1 time in 5  5) How often have you had a weak urinary stream?  2 - Less than half the time  6) How often have you had to push or strain to begin urination?  1 - Less than 1 time in 5  7) How many times did you most typically get up to urinate from the time you went to bed until the time you got up in the morning?  0 - None  Total score:  7. Which indicates mild symptoms  0-7 mildly symptomatic   8-19 moderately symptomatic   20-35 severely symptomatic    Patient was advised to call to schedule their  post-treatment follow up with his urologist Dr. Jeffie Pollock for ongoing surveillance. He was counseled that PSA levels will be drawn in his urologist office, and was reassured that additional time is expected to improve bowel and bladder symptoms. He was encouraged to call back with concerns or questions regarding radiation.  This concludes the Post-Treatment-Nursing interview.  Leandra Kern, LPN

## 2022-03-18 ENCOUNTER — Ambulatory Visit
Admission: RE | Admit: 2022-03-18 | Discharge: 2022-03-18 | Disposition: A | Discharge status: Home / Self Care | Payer: Medicare HMO | Source: Ambulatory Visit | Attending: Urology | Admitting: Urology

## 2022-03-18 ENCOUNTER — Encounter: Payer: Self-pay | Admitting: Internal Medicine

## 2022-03-18 DIAGNOSIS — C61 Malignant neoplasm of prostate: Secondary | ICD-10-CM

## 2022-03-18 NOTE — Progress Notes (Signed)
  Radiation Oncology         [phone] ________________________________  Name: Bobie Kistler MRN: [id number]  Date: 01/29/2022  DOB: May 23, 1951  End of Treatment Note  Diagnosis:  71 y.o. gentleman with locally recurrent prostate cancer s/p brachytherapy in 2013 for T1c, Gleason 3+3 adenocarcinoma.      Indication for treatment:  Curative, Definitive SBRT       Radiation treatment dates:   01/19/22 - 01/29/22  Site/dose:   The patient received 36.25 Gy in 5 fractions to the PET-positive foci in the prostate and 20 Gy in 5 fractions to the whole prostate.   Beams/energy:   The patient was treated using stereotactic body radiotherapy according to a 3D conformal radiotherapy plan.  Volumetric arc fields were employed to deliver 6 MV X-rays.  Image guidance was performed with per fraction cone beam CT prior to treatment under personal MD supervision.  Immobilization was achieved using BodyFix Pillow.  Narrative: The patient tolerated radiation treatment relatively well without any ill side effects.  Plan: The patient has completed radiation treatment. The patient will return to radiation oncology clinic for routine followup in one month. I advised them to call or return sooner if they have any questions or concerns related to their recovery or treatment. ________________________________  Sheral Apley. Tammi Klippel, M.D.

## 2022-04-12 ENCOUNTER — Other Ambulatory Visit: Payer: Self-pay | Admitting: Internal Medicine

## 2022-05-11 ENCOUNTER — Other Ambulatory Visit: Payer: Self-pay | Admitting: Internal Medicine

## 2022-06-08 ENCOUNTER — Encounter: Payer: Self-pay | Admitting: Cardiovascular Disease

## 2022-06-08 ENCOUNTER — Encounter: Payer: Self-pay | Admitting: Pharmacist Clinician (PhC)/ Clinical Pharmacy Specialist

## 2022-06-15 ENCOUNTER — Other Ambulatory Visit (HOSPITAL_COMMUNITY): Payer: Self-pay

## 2022-07-01 DIAGNOSIS — E349 Endocrine disorder, unspecified: Secondary | ICD-10-CM | POA: Diagnosis not present

## 2022-07-01 DIAGNOSIS — C61 Malignant neoplasm of prostate: Secondary | ICD-10-CM | POA: Diagnosis not present

## 2022-07-01 DIAGNOSIS — R3915 Urgency of urination: Secondary | ICD-10-CM | POA: Diagnosis not present

## 2022-07-01 DIAGNOSIS — N401 Enlarged prostate with lower urinary tract symptoms: Secondary | ICD-10-CM | POA: Diagnosis not present

## 2022-07-05 ENCOUNTER — Other Ambulatory Visit: Payer: Self-pay | Admitting: Internal Medicine

## 2022-07-06 ENCOUNTER — Other Ambulatory Visit: Payer: Self-pay | Admitting: Internal Medicine

## 2022-07-07 DIAGNOSIS — Z961 Presence of intraocular lens: Secondary | ICD-10-CM | POA: Diagnosis not present

## 2022-07-07 DIAGNOSIS — H04123 Dry eye syndrome of bilateral lacrimal glands: Secondary | ICD-10-CM | POA: Diagnosis not present

## 2022-07-07 DIAGNOSIS — H40023 Open angle with borderline findings, high risk, bilateral: Secondary | ICD-10-CM | POA: Diagnosis not present

## 2022-07-07 DIAGNOSIS — H26491 Other secondary cataract, right eye: Secondary | ICD-10-CM | POA: Diagnosis not present

## 2022-07-07 DIAGNOSIS — H26493 Other secondary cataract, bilateral: Secondary | ICD-10-CM | POA: Diagnosis not present

## 2022-07-17 ENCOUNTER — Ambulatory Visit: Payer: Medicare HMO | Admitting: Cardiovascular Disease

## 2022-07-18 ENCOUNTER — Other Ambulatory Visit: Payer: Self-pay | Admitting: Internal Medicine

## 2022-07-20 ENCOUNTER — Other Ambulatory Visit: Payer: Self-pay | Admitting: Internal Medicine

## 2022-07-21 ENCOUNTER — Encounter: Payer: Self-pay | Admitting: Pharmacist

## 2022-07-21 NOTE — Progress Notes (Signed)
[facility])                                            [place] Team                                        Statin Quality Measure Assessment    07/21/2022  Justin Ayala 07-02-1950 [id number]  Dr. Gwenlyn Found,   I am a [facility] clinical pharmacist that reviews patients for statin quality initiatives.     Per review of chart and payor information, Justin Ayala has a diagnosis of cardiovascular disease but is not currently filling a statin prescription.  This places patient into the [facility] (Statin Use In Patients with Cardiovascular Disease) measure for CMS.    Patient has documented trials of statins with reported muscle pain/weakness and elevated LFTs, but no corresponding CPT codes that would exclude patient from [facility] measure.Please consider evaluating his past statin intolerance and adding the appropriate diagnosis code to tomorrow's office visit to remove him from the measure for 2024.  Code for past statin intolerance  (required annually)  Provider Requirements: Must asociate code during an office visit or telehealth encounter   Drug Induced Myopathy G72.0   Myalgia M79.1   Myositis, unspecified M60.9   Myopathy, unspecified G72.9   Rhabdomyolysis M62.82    Thank you, Curlene Labrum, PharmD [place] Pharmacist Office: [phone]

## 2022-07-22 ENCOUNTER — Encounter: Payer: Self-pay | Admitting: Cardiovascular Disease

## 2022-07-22 ENCOUNTER — Ambulatory Visit: Payer: Medicare HMO | Attending: Cardiovascular Disease | Admitting: Cardiovascular Disease

## 2022-07-22 VITALS — BP 142/74 | HR 63 | Ht 67.0 in | Wt 220.2 lb

## 2022-07-22 DIAGNOSIS — E782 Mixed hyperlipidemia: Secondary | ICD-10-CM

## 2022-07-22 DIAGNOSIS — I2510 Atherosclerotic heart disease of native coronary artery without angina pectoris: Secondary | ICD-10-CM | POA: Diagnosis not present

## 2022-07-22 DIAGNOSIS — I10 Essential (primary) hypertension: Secondary | ICD-10-CM

## 2022-07-22 NOTE — Assessment & Plan Note (Signed)
History of hyperlipidemia intolerant to statin therapy on Repatha and Zetia with lipid profile performed 09/22/2021 revealing total cholesterol 134, LDL 63 and HDL 44.

## 2022-07-22 NOTE — Assessment & Plan Note (Signed)
History of clinical CAD by cardiac catheterization performed 10/12/2008.  He denies chest pain.

## 2022-07-22 NOTE — Assessment & Plan Note (Signed)
History of essential hypertension blood pressure measured at 142/74.  He is on hydrochlorothiazide, lisinopril and metoprolol.

## 2022-07-22 NOTE — Patient Instructions (Signed)

## 2022-07-22 NOTE — Progress Notes (Signed)
07/22/2022 Justin Ayala   [birth year]  [id number]  Primary Physician Hoyt Koch, MD Primary Cardiologist: Lorretta Harp MD Garret Reddish, [place], [place]  HPI:  Justin Ayala is a 72 y.o.   mildly overweight married African American male, father of 63, grandfather to 4 grandchildren, who I last saw office 06/13/2021... He has a history of moderate, but not critical, CAD by catheterization, which I performed Oct 12, 2008. He had a 60% to 70% proximal first diagonal branch stenosis. He had 50% distal dominant circumflex stenosis in the PDA with normal LV function. His other problems include hypertension and hyperlipidemia. He does not smoke. He has been exercising more recently. He has had a gastric polyp in the past, found in the setting of a GI workup for GI bleed. He was transfused at that time.his lipid profile followed by his primary care physician. He denies chest pain or shortness of breath. Since I saw him last he was complaining of lower extremity weakness which resolved after he stopped his Lipitor on his own. He did have elevated liver function tests as well followed by Dr. Benson Norway.  because of his statin intolerance and his elevated LDL of 166 measured on 09/29/16 he was begun on Repatha  which he is tolerating well.    Since I saw him a year ago he is remained completely asymptomatic denying chest pain or shortness of breath.  He did see Dr. Audie Box  in the office for what sounds like vasovagal syncope.   Current Meds  Medication Sig   ASPIRIN ADULT LOW STRENGTH 81 MG EC tablet TAKE 1 BY MOUTH DAILY   Cholecalciferol (VITAMIN D-3) 1000 UNITS CAPS Take by mouth. Take one daily   Cranberry 125 MG TABS Take 1 tablet by mouth 2 (two) times a week.   Evolocumab with Infusor ([place]) 420 MG/3.5ML SOCT INJECT 420 MG INTO THE SKIN EVERY 30 DAYS   ezetimibe (ZETIA) 10 MG tablet Take 1 tablet by mouth once daily   ferrous sulfate 325 (65 FE) MG tablet Take 1 tablet (325  mg total) by mouth daily.   Glucosamine-Chondroitin (OSTEO BI-FLEX REGULAR STRENGTH PO) Take 1 tablet by mouth daily.   hydrochlorothiazide (HYDRODIURIL) 25 MG tablet Take 1 tablet (25 mg total) by mouth daily. Annual appt due in May must see provider for future refills   [name] 100 MG CAPS Take 1 capsule (100 mg total) by mouth daily.   lisinopril (ZESTRIL) 20 MG tablet Take 1 tablet by mouth once daily   meloxicam (MOBIC) 7.5 MG tablet Take 1 tablet by mouth once daily   metoprolol tartrate (LOPRESSOR) 25 MG tablet Take 1/2 (one-half) tablet by mouth twice daily   pantoprazole (PROTONIX) 40 MG tablet Take 1 tablet by mouth once daily   Resveratrol 100 MG CAPS Take 1 capsule by mouth daily.   tamsulosin (FLOMAX) 0.4 MG CAPS capsule Take 1 capsule (0.4 mg total) by mouth 2 (two) times daily.     Allergies  Allergen Reactions   Lipitor [Atorvastatin] Other (See Comments)    Muscle weakness, elevated LFTs    Social History   Socioeconomic History   Marital status: Married    Spouse name: Not on file   Number of children: 3   Years of education: BS    Highest education level: Not on file  Occupational History    Employer: [employer]  Tobacco Use   Smoking status: Never   Smokeless tobacco: Never  Substance and  Sexual Activity   Alcohol use: No   Drug use: No   Sexual activity: Yes    Partners: Female  Other Topics Concern   Not on file  Social History Narrative   married x 30+years; 3 children, 4 grandsons    no smoking or ETOH;    Retired Merchant navy officer formerly at [employer];    likes to golf (golfs 3-4 times a week); no exercise and rides cart while golfing         Social Determinants of Health   Financial Resource Strain: Low Risk  (09/19/2021)   Overall Financial Resource Strain (CARDIA)    Difficulty of Paying Living Expenses: Not hard at all  Food Insecurity: No Food Insecurity (09/19/2021)   Hunger Vital Sign    Worried About Running Out of Food in  the Last Year: Never true    [place] in the Last Year: Never true  Transportation Needs: No Transportation Needs (09/19/2021)   PRAPARE - Hydrologist (Medical): No    Lack of Transportation (Non-Medical): No  Physical Activity: Sufficiently Active (09/19/2021)   Exercise Vital Sign    Days of Exercise per Week: 5 days    Minutes of Exercise per Session: 30 min  Stress: No Stress Concern Present (09/19/2021)   [place]    Feeling of Stress : Not at all  Social Connections: [place] (09/19/2021)   Social Connection and Isolation Panel [NHANES]    Frequency of Communication with Friends and Family: More than three times a week    Frequency of Social Gatherings with Friends and Family: More than three times a week    Attends Religious Services: More than 4 times per year    Active Member of [employer] or Organizations: Yes    Attends Music therapist: More than 4 times per year    Marital Status: Married  Human resources officer Violence: Not At Risk (09/19/2021)   Humiliation, Afraid, Rape, and Kick questionnaire    Fear of Current or Ex-Partner: No    Emotionally Abused: No    Physically Abused: No    Sexually Abused: No     Review of Systems: General: negative for chills, fever, night sweats or weight changes.  Cardiovascular: negative for chest pain, dyspnea on exertion, edema, orthopnea, palpitations, paroxysmal nocturnal dyspnea or shortness of breath Dermatological: negative for rash Respiratory: negative for cough or wheezing Urologic: negative for hematuria Abdominal: negative for nausea, vomiting, diarrhea, bright red blood per rectum, melena, or hematemesis Neurologic: negative for visual changes, syncope, or dizziness All other systems reviewed and are otherwise negative except as noted above.    Blood pressure (!) 142/74, pulse 63, height '5\' 7"'$  (1.702 m),  weight 220 lb 3.2 oz (99.9 kg), SpO2 95 %.  General appearance: alert and no distress Neck: no adenopathy, no carotid bruit, no JVD, supple, symmetrical, trachea midline, and thyroid not enlarged, symmetric, no tenderness/mass/nodules Lungs: clear to auscultation bilaterally Heart: regular rate and rhythm, S1, S2 normal, no murmur, click, rub or gallop Extremities: extremities normal, atraumatic, no cyanosis or edema Pulses: 2+ and symmetric Skin: Skin color, texture, turgor normal. No rashes or lesions Neurologic: Grossly normal  EKG sinus rhythm at 63 with poor R wave progression and inferolateral T wave inversion.  This is unchanged since prior EKGs.  I personally reviewed this EKG.  ASSESSMENT AND PLAN:   Hyperlipidemia History of hyperlipidemia intolerant to statin therapy on  Repatha and Zetia with lipid profile performed 09/22/2021 revealing total cholesterol 134, LDL 63 and HDL 44.  HYPERTENSION, BENIGN SYSTEMIC History of essential hypertension blood pressure measured at 142/74.  He is on hydrochlorothiazide, lisinopril and metoprolol.  CAD (coronary artery disease) History of clinical CAD by cardiac catheterization performed 10/12/2008.  He denies chest pain.     Lorretta Harp MD FACP,FACC,FAHA, [facility] 07/22/2022 3:04 PM

## 2022-08-28 ENCOUNTER — Other Ambulatory Visit: Payer: Self-pay | Admitting: Cardiovascular Disease

## 2022-08-28 DIAGNOSIS — I2510 Atherosclerotic heart disease of native coronary artery without angina pectoris: Secondary | ICD-10-CM

## 2022-08-28 DIAGNOSIS — E782 Mixed hyperlipidemia: Secondary | ICD-10-CM

## 2022-10-26 ENCOUNTER — Telehealth: Payer: Self-pay

## 2022-10-26 NOTE — Telephone Encounter (Signed)
Unsuccessful attempts to reach patient on preferred number listed in notes for scheduled AWV. Left message on voicemail okay to reschedule.  Jayden Kratochvil N. Loura Pitt, LPN. [facility] AWV Team Direct Dial: [phone]

## 2022-11-03 ENCOUNTER — Other Ambulatory Visit: Payer: Self-pay | Admitting: Internal Medicine

## 2022-11-10 ENCOUNTER — Encounter: Payer: Self-pay | Admitting: Internal Medicine

## 2022-11-10 ENCOUNTER — Ambulatory Visit (INDEPENDENT_AMBULATORY_CARE_PROVIDER_SITE_OTHER): Payer: Medicare HMO | Admitting: Internal Medicine

## 2022-11-10 VITALS — BP 120/80 | HR 65 | Temp 98.2°F | Ht 67.0 in | Wt 212.0 lb

## 2022-11-10 DIAGNOSIS — Z8546 Personal history of malignant neoplasm of prostate: Secondary | ICD-10-CM

## 2022-11-10 DIAGNOSIS — I10 Essential (primary) hypertension: Secondary | ICD-10-CM

## 2022-11-10 DIAGNOSIS — E278 Other specified disorders of adrenal gland: Secondary | ICD-10-CM | POA: Diagnosis not present

## 2022-11-10 DIAGNOSIS — E782 Mixed hyperlipidemia: Secondary | ICD-10-CM | POA: Diagnosis not present

## 2022-11-10 DIAGNOSIS — K219 Gastro-esophageal reflux disease without esophagitis: Secondary | ICD-10-CM

## 2022-11-10 DIAGNOSIS — I6521 Occlusion and stenosis of right carotid artery: Secondary | ICD-10-CM | POA: Diagnosis not present

## 2022-11-10 DIAGNOSIS — Z0000 Encounter for general adult medical examination without abnormal findings: Secondary | ICD-10-CM | POA: Diagnosis not present

## 2022-11-10 LAB — COMPREHENSIVE METABOLIC PANEL
ALT: 20 U/L (ref 0–53)
AST: 26 U/L (ref 0–37)
Albumin: 4.3 g/dL (ref 3.5–5.2)
Alkaline Phosphatase: 62 U/L (ref 39–117)
BUN: 17 mg/dL (ref 6–23)
CO2: 31 mEq/L (ref 19–32)
Calcium: 9.2 mg/dL (ref 8.4–10.5)
Chloride: 101 mEq/L (ref 96–112)
Creatinine, Ser: 1.24 mg/dL (ref 0.40–1.50)
GFR: 57.49 mL/min — ABNORMAL LOW (ref >60.00)
Glucose, Bld: 100 mg/dL — ABNORMAL HIGH (ref 70–99)
Potassium: 4.1 mEq/L (ref 3.5–5.1)
Sodium: 139 mEq/L (ref 135–145)
Total Bilirubin: 0.5 mg/dL (ref 0.2–1.2)
Total Protein: 7.8 g/dL (ref 6.0–8.3)

## 2022-11-10 LAB — CBC
HCT: 42.7 % (ref 39.0–52.0)
Hemoglobin: 13.8 g/dL (ref 13.0–17.0)
MCHC: 32.3 g/dL (ref 30.0–36.0)
MCV: 87 fl (ref 78.0–100.0)
Platelets: 225 10*3/uL (ref 150.0–400.0)
RBC: 4.91 Mil/uL (ref 4.22–5.81)
RDW: 13.7 % (ref 11.5–15.5)
WBC: 4.7 10*3/uL (ref 4.0–10.5)

## 2022-11-10 LAB — LIPID PANEL
Cholesterol: 124 mg/dL (ref 0–200)
HDL: 44.1 mg/dL (ref >39.00)
LDL Cholesterol: 65 mg/dL (ref 0–99)
NonHDL: 79.76
Total CHOL/HDL Ratio: 3
Triglycerides: 75 mg/dL (ref 0.0–149.0)
VLDL: 15 mg/dL (ref 0.0–40.0)

## 2022-11-10 MED ORDER — HYDROCHLOROTHIAZIDE 25 MG PO TABS: 25.0000 mg | ORAL_TABLET | Freq: Every day | ORAL | 3 refills | Status: DC

## 2022-11-10 MED ORDER — METOPROLOL TARTRATE 25 MG PO TABS: ORAL_TABLET | ORAL | 3 refills | Status: DC

## 2022-11-10 MED ORDER — MELOXICAM 7.5 MG PO TABS: 7.5000 mg | ORAL_TABLET | Freq: Every day | ORAL | 3 refills | Status: DC

## 2022-11-10 MED ORDER — PANTOPRAZOLE SODIUM 40 MG PO TBEC: 40.0000 mg | DELAYED_RELEASE_TABLET | Freq: Every day | ORAL | 3 refills | Status: DC

## 2022-11-10 MED ORDER — TAMSULOSIN HCL 0.4 MG PO CAPS: 0.4000 mg | ORAL_CAPSULE | Freq: Two times a day (BID) | ORAL | 3 refills | Status: DC

## 2022-11-10 NOTE — Progress Notes (Signed)
Subjective:   Patient ID: Justin Ayala, male    DOB: Jan 04, 1951, 72 y.o.   MRN: [id number]  HPI Here for medicare wellness and physical, no new complaints. Please see A/P for status and treatment of chronic medical problems.   Diet: heart healthy Physical activity: sedentary Depression/mood screen: negative Hearing: intact to whispered voice, mild tinnitus Visual acuity: grossly normal with lens, performs annual eye exam  ADLs: capable Fall risk: none Home safety: good Cognitive evaluation: intact to orientation, naming, recall and repetition EOL planning: adv directives discussed  Flowsheet Row Office Visit from 11/10/2022 in [facility] [place]  PHQ-2 Total Score 0       Flowsheet Row Office Visit from 02/09/2022 in [facility]  PHQ-9 Total Score 0         08/25/2021    4:09 PM 09/19/2021    9:21 AM 09/22/2021   10:00 AM 01/02/2022   12:41 PM 11/10/2022    1:42 PM  Fall Risk  Falls in the past year?  0 0  0  Was there an injury with Fall?  0   0  Fall Risk Category Calculator  0   0  Fall Risk Category (Retired)  Low     (RETIRED) Patient Fall Risk Level Low fall risk Low fall risk  Low fall risk   Patient at Risk for Falls Due to  No Fall Risks     Fall risk Follow up  Falls evaluation completed   Falls evaluation completed    I have personally reviewed and have noted 1. The patient's medical and social history - reviewed today no changes 2. Their use of alcohol, tobacco or illicit drugs 3. Their current medications and supplements 4. The patient's functional ability including ADL's, fall risks, home safety risks and hearing or visual impairment. 5. Diet and physical activities 6. Evidence for depression or mood disorders 7. Care team reviewed and updated 8.  The patient is not on an opioid pain medication.  Patient Care Team: Myrlene Broker, MD as PCP - General (Internal Medicine) Runell Gess, MD as PCP - Cardiology (Cardiology) Kathyrn Sheriff, [facility] as Pharmacist (Pharmacist) Past Medical History:  Diagnosis Date   Allergic rhinitis    Anemia 2010   CAD (coronary artery disease)    60-70% prox 1st diagonal branch stenosis, 50% dominant circumflex stenosis in PDA, normal LV function (by 10/12/2008 cath)   GERD (gastroesophageal reflux disease)    History of cardiovascular stress test 10/02/2008   exercise tolerance test - abnormal test - subsequent cath    HLD (hyperlipidemia)    HTN (hypertension)    Kidney stones    Overweight(278.02)    Peptic ulcer    Prostate cancer [facility])    Past Surgical History:  Procedure Laterality Date   APPENDECTOMY  12/2008   Dr. Gordy Savers   BIOPSY PROSTATE  03/19/11   gleason 3+3=6, volume 34 cc, psa 01/06/11 5.00   CARDIAC CATHETERIZATION  2010   non-critical stenosis   FLEXIBLE SIGMOIDOSCOPY N/A 08/18/2013   Procedure: FLEXIBLE SIGMOIDOSCOPY;  Surgeon: Theda Belfast, MD;  Location: [facility];  Service: Endoscopy;  Laterality: N/A;   HOT HEMOSTASIS N/A 08/18/2013   Procedure: HOT HEMOSTASIS (ARGON PLASMA COAGULATION/BICAP);  Surgeon: Theda Belfast, MD;  Location: [facility];  Service: Endoscopy;  Laterality: N/A;  AVM - sig colon   RADIOACTIVE SEED IMPLANT  08/14/2011   Procedure: RADIOACTIVE SEED IMPLANT;  Surgeon: Garnett Farm, MD;  Location: [facility];  Service: Urology;  Laterality: N/A;  84  SEEDS IMPLANTED IN PROSTATE   TONSILLECTOMY     Family History  Problem Relation Age of Onset   Hypertension Father        also organ failure   Stroke Father    Hypertension Mother    Cervical cancer Mother    Prostate cancer Brother        seed implant   Heart failure Paternal Grandmother    Heart failure Paternal Grandfather    Diabetes Neg Hx     Review of Systems  Constitutional: Negative.   HENT: Negative.    Eyes: Negative.   Respiratory:  Negative for cough, chest tightness and shortness of  breath.   Cardiovascular:  Negative for chest pain, palpitations and leg swelling.  Gastrointestinal:  Negative for abdominal distention, abdominal pain, constipation, diarrhea, nausea and vomiting.  Musculoskeletal: Negative.   Skin: Negative.   Neurological: Negative.   Psychiatric/Behavioral: Negative.      Objective:  Physical Exam Constitutional:      Appearance: He is well-developed.  HENT:     Head: Normocephalic and atraumatic.  Cardiovascular:     Rate and Rhythm: Normal rate and regular rhythm.  Pulmonary:     Effort: Pulmonary effort is normal. No respiratory distress.     Breath sounds: Normal breath sounds. No wheezing or rales.  Abdominal:     General: Bowel sounds are normal. There is no distension.     Palpations: Abdomen is soft.     Tenderness: There is no abdominal tenderness. There is no rebound.  Musculoskeletal:        General: Tenderness present.     Cervical back: Normal range of motion.  Skin:    General: Skin is warm and dry.  Neurological:     Mental Status: He is alert and oriented to person, place, and time.     Coordination: Coordination normal.     Vitals:   11/10/22 1338  BP: 120/80  Pulse: 65  Temp: 98.2 F (36.8 C)  TempSrc: Oral  SpO2: 98%  Weight: 212 lb (96.2 kg)  Height: 5\' 7"  (1.702 m)    Assessment & Plan:

## 2022-11-10 NOTE — Patient Instructions (Signed)
We will check the labs today. 

## 2022-11-11 NOTE — Assessment & Plan Note (Signed)
Seeing urology and still up to date with PSA monitoring.

## 2022-11-11 NOTE — Assessment & Plan Note (Signed)
Taking aspirin and repatha. Unable to tolerate statins.

## 2022-11-11 NOTE — Assessment & Plan Note (Signed)
Checking lipid panel and adjust zetia 10 mg daily and repatha 420 mg monthly as needed.

## 2022-11-11 NOTE — Assessment & Plan Note (Signed)
Does not need further workup. No clinical symptoms of hormonal secretion.

## 2022-11-11 NOTE — Assessment & Plan Note (Signed)
BP at goal on lisinopril 20 mg daily and metoprolol 12.5 mg BID and hydrochlorothiazide 25 mg daily and checking CMP and adjust as needed.

## 2022-11-11 NOTE — Assessment & Plan Note (Signed)
Taking protonix 40 mg daily and controlled. Will continue. 

## 2022-11-11 NOTE — Assessment & Plan Note (Signed)
Flu shot yearly. Pneumonia due declines today. Shingrix due at pharmacy. Tetanus due at pharmacy. Colonoscopy due 2025. Counseled about sun safety and mole surveillance. Counseled about the dangers of distracted driving. Given 10 year screening recommendations.

## 2022-12-30 DIAGNOSIS — C61 Malignant neoplasm of prostate: Secondary | ICD-10-CM | POA: Diagnosis not present

## 2023-01-06 DIAGNOSIS — N401 Enlarged prostate with lower urinary tract symptoms: Secondary | ICD-10-CM | POA: Diagnosis not present

## 2023-01-06 DIAGNOSIS — R3915 Urgency of urination: Secondary | ICD-10-CM | POA: Diagnosis not present

## 2023-01-06 DIAGNOSIS — R3912 Poor urinary stream: Secondary | ICD-10-CM | POA: Diagnosis not present

## 2023-01-06 DIAGNOSIS — Z8546 Personal history of malignant neoplasm of prostate: Secondary | ICD-10-CM | POA: Diagnosis not present

## 2023-01-26 ENCOUNTER — Encounter: Payer: Self-pay | Admitting: Cardiovascular Disease

## 2023-01-26 DIAGNOSIS — E782 Mixed hyperlipidemia: Secondary | ICD-10-CM

## 2023-01-26 DIAGNOSIS — I2510 Atherosclerotic heart disease of native coronary artery without angina pectoris: Secondary | ICD-10-CM

## 2023-01-26 MED ORDER — REPATHA SURECLICK 140 MG/ML ~~LOC~~ SOAJ
1.0000 mL | SUBCUTANEOUS | 5 refills | Status: DC
Start: 2023-01-26 — End: 2023-07-16

## 2023-02-10 ENCOUNTER — Other Ambulatory Visit: Payer: Self-pay | Admitting: Internal Medicine

## 2023-02-16 ENCOUNTER — Encounter: Payer: Self-pay | Admitting: Cardiovascular Disease

## 2023-02-16 ENCOUNTER — Other Ambulatory Visit: Payer: Self-pay

## 2023-02-16 ENCOUNTER — Other Ambulatory Visit: Payer: Self-pay | Admitting: Internal Medicine

## 2023-02-16 MED ORDER — EZETIMIBE 10 MG PO TABS: 10.0000 mg | ORAL_TABLET | Freq: Every day | ORAL | 3 refills | Status: DC

## 2023-03-14 ENCOUNTER — Other Ambulatory Visit: Payer: Self-pay | Admitting: Cardiovascular Disease

## 2023-03-14 DIAGNOSIS — I10 Essential (primary) hypertension: Secondary | ICD-10-CM

## 2023-05-10 ENCOUNTER — Encounter: Payer: Self-pay | Admitting: Cardiovascular Disease

## 2023-05-10 ENCOUNTER — Encounter: Payer: Self-pay | Admitting: Internal Medicine

## 2023-05-31 ENCOUNTER — Ambulatory Visit: Payer: Medicare HMO | Admitting: Internal Medicine

## 2023-06-08 ENCOUNTER — Other Ambulatory Visit: Payer: Self-pay

## 2023-06-09 ENCOUNTER — Ambulatory Visit: Payer: Medicare HMO | Attending: Cardiovascular Disease | Admitting: Cardiovascular Disease

## 2023-06-09 ENCOUNTER — Encounter: Payer: Self-pay | Admitting: Cardiovascular Disease

## 2023-06-09 VITALS — BP 130/86 | HR 71 | Ht 67.0 in | Wt 218.2 lb

## 2023-06-09 DIAGNOSIS — E782 Mixed hyperlipidemia: Secondary | ICD-10-CM

## 2023-06-09 DIAGNOSIS — I10 Essential (primary) hypertension: Secondary | ICD-10-CM | POA: Diagnosis not present

## 2023-06-09 DIAGNOSIS — I2510 Atherosclerotic heart disease of native coronary artery without angina pectoris: Secondary | ICD-10-CM | POA: Diagnosis not present

## 2023-06-09 DIAGNOSIS — R200 Anesthesia of skin: Secondary | ICD-10-CM

## 2023-06-09 NOTE — Patient Instructions (Signed)

## 2023-06-09 NOTE — Assessment & Plan Note (Addendum)
 History of hyperlipidemia on Repatha  and Zetia  with lipid profile performed 11/10/2022 revealing total cholesterol 124, LDL 65 and HDL 44.

## 2023-06-09 NOTE — Assessment & Plan Note (Signed)
 History of CAD status post cardiac catheterization done by myself 10/12/2008 revealing 60 to 70% proximal first diagonal branch stenosis, 50% circumflex stenosis in the PDA with normal LV function.  He is completely asymptomatic.

## 2023-06-09 NOTE — Progress Notes (Signed)
 06/09/2023 Justin Ayala   03-16-1951  [id number]  Primary Physician Adelia Homestead, MD Primary Cardiologist: Avanell Leigh MD Lathan Poke, [place], [place]  HPI:  Justin Ayala is a 73 y.o.  mildly overweight married African American male, father of 3, grandfather to 4 grandchildren, who I last saw office 07/22/2022... He has a history of moderate, but not critical, CAD by catheterization, which I performed Oct 12, 2008. He had a 60% to 70% proximal first diagonal branch stenosis. He had 50% distal dominant circumflex stenosis in the PDA with normal LV function. His other problems include hypertension and hyperlipidemia. He does not smoke. He has been exercising more recently. He has had a gastric polyp in the past, found in the setting of a GI workup for GI bleed. He was transfused at that time.his lipid profile followed by his primary care physician. He denies chest pain or shortness of breath. Since I saw him last he was complaining of lower extremity weakness which resolved after he stopped his Lipitor on his own. He did have elevated liver function tests as well followed by Dr. Nickey Barn.  because of his statin intolerance and his elevated LDL of 166 measured on 09/29/16 he was begun on Repatha   which he is tolerating well.    Since I saw him a year ago he is remained completely asymptomatic denying chest pain or shortness of breath.  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  He was complaining of some numbness to the tips of his fingers of both hands which sounds neurogenic.  I suspect he has cervical disc problems.   Current Meds  Medication Sig   ASPIRIN  ADULT LOW STRENGTH 81 MG EC tablet TAKE 1 BY MOUTH DAILY   Cholecalciferol (VITAMIN D-3) 1000 UNITS CAPS Take by mouth. Take one daily   Cranberry 125 MG TABS Take 1 tablet by mouth 2 (two) times a week.   Evolocumab  (REPATHA  SURECLICK) 140 MG/ML SOAJ Inject 140 mg into the skin  every 14 (fourteen) days.   ezetimibe  (ZETIA ) 10 MG tablet Take 1 tablet (10 mg total) by mouth daily.   ferrous sulfate  325 (65 FE) MG tablet Take 1 tablet (325 mg total) by mouth daily.   Glucosamine-Chondroitin (OSTEO BI-FLEX REGULAR STRENGTH PO) Take 1 tablet by mouth daily.   hydrochlorothiazide  (HYDRODIURIL ) 25 MG tablet Take 1 tablet (25 mg total) by mouth daily. Annual appt due in May must see provider for future refills   [place] Ginseng 100 MG CAPS Take 1 capsule (100 mg total) by mouth daily.   lisinopril  (ZESTRIL ) 20 MG tablet Take 1 tablet by mouth once daily   meloxicam  (MOBIC ) 7.5 MG tablet Take 1 tablet (7.5 mg total) by mouth daily.   metoprolol  tartrate (LOPRESSOR ) 25 MG tablet TAKE 1/2 (ONE-HALF) TABLET BY MOUTH TWICE DAILY . APPOINTMENT REQUIRED FOR FUTURE REFILLS   pantoprazole  (PROTONIX ) 40 MG tablet Take 1 tablet (40 mg total) by mouth daily.   Resveratrol 100 MG CAPS Take 1 capsule by mouth daily.   tamsulosin  (FLOMAX ) 0.4 MG CAPS capsule Take 1 capsule (0.4 mg total) by mouth 2 (two) times daily.     Allergies  Allergen Reactions   Lipitor [Atorvastatin ] Other (See Comments)    Muscle weakness, elevated LFTs    Social History   Socioeconomic History   Marital status: Married    Spouse name: Not on file   Number  of children: 3   Years of education: BS    Highest education level: Not on file  Occupational History    Employer: [employer]  Tobacco Use   Smoking status: Never   Smokeless tobacco: Never  Substance and Sexual Activity   Alcohol use: No   Drug use: No   Sexual activity: Yes    Partners: Female  Other Topics Concern   Not on file  Social History Narrative   married x 30+years; 3 children, 4 grandsons    no smoking or ETOH;    Retired Pensions consultant formerly at [employer];    likes to golf (golfs 3-4 times a week); no exercise and rides cart while golfing         Social Drivers of Corporate investment banker Strain: Low Risk   (09/19/2021)   Overall Financial Resource Strain (CARDIA)    Difficulty of Paying Living Expenses: Not hard at all  Food Insecurity: No Food Insecurity (09/19/2021)   Hunger Vital Sign    Worried About Running Out of Food in the Last Year: Never true    Ran Out of Food in the Last Year: Never true  Transportation Needs: No Transportation Needs (09/19/2021)   PRAPARE - Administrator, Civil Service (Medical): No    Lack of Transportation (Non-Medical): No  Physical Activity: Sufficiently Active (09/19/2021)   Exercise Vital Sign    Days of Exercise per Week: 5 days    Minutes of Exercise per Session: 30 min  Stress: No Stress Concern Present (09/19/2021)   [employer] of Occupational Health - Occupational Stress Questionnaire    Feeling of Stress : Not at all  Social Connections: Socially Integrated (09/19/2021)   Social Connection and Isolation Panel [NHANES]    Frequency of Communication with Friends and Family: More than three times a week    Frequency of Social Gatherings with Friends and Family: More than three times a week    Attends Religious Services: More than 4 times per year    Active Member of [employer] or Organizations: Yes    Attends Engineer, structural: More than 4 times per year    Marital Status: Married  Catering manager Violence: Not At Risk (09/19/2021)   Humiliation, Afraid, Rape, and Kick questionnaire    Fear of Current or Ex-Partner: No    Emotionally Abused: No    Physically Abused: No    Sexually Abused: No     Review of Systems: General: negative for chills, fever, night sweats or weight changes.  Cardiovascular: negative for chest pain, dyspnea on exertion, edema, orthopnea, palpitations, paroxysmal nocturnal dyspnea or shortness of breath Dermatological: negative for rash Respiratory: negative for cough or wheezing Urologic: negative for hematuria Abdominal: negative for nausea, vomiting, diarrhea, bright red blood per rectum,  melena, or hematemesis Neurologic: negative for visual changes, syncope, or dizziness All other systems reviewed and are otherwise negative except as noted above.    Blood pressure 130/86, pulse 71, height 5\' 7"  (1.702 m), weight 218 lb 3.2 oz (99 kg), SpO2 97%.  General appearance: alert and no distress Neck: no adenopathy, no carotid bruit, no JVD, supple, symmetrical, trachea midline, and thyroid not enlarged, symmetric, no tenderness/mass/nodules Lungs: clear to auscultation bilaterally Heart: regular rate and rhythm, S1, S2 normal, no murmur, click, rub or gallop Extremities: extremities normal, atraumatic, no cyanosis or edema Pulses: 2+ and symmetric Skin: Skin color, texture, turgor normal. No rashes or lesions Neurologic: Grossly normal  EKG EKG Interpretation  Date/Time:  Wednesday June 09 2023 14:50:36 EST Ventricular Rate:  71 PR Interval:  164 QRS Duration:  74 QT Interval:  400 QTC Calculation: 434 R Axis:   40  Text Interpretation: Normal sinus rhythm Cannot rule out Anterior infarct , age undetermined When compared with ECG of 25-Aug-2021 17:29, PREVIOUS ECG IS PRESENT Confirmed by Lauro Portal [phone]) on 06/09/2023 2:52:05 PM    ASSESSMENT AND PLAN:   Hyperlipidemia History of hyperlipidemia on Repatha  and Zetia  with lipid profile performed 11/10/2022 revealing total cholesterol 124, LDL 65 and HDL 44.  HYPERTENSION, BENIGN SYSTEMIC History of essential hypertension her blood pressure measured today at 130/86.  He is on HydroDIURIL , lisinopril  and metoprolol .  CAD (coronary artery disease) History of CAD status post cardiac catheterization done by myself 10/12/2008 revealing 60 to 70% proximal first diagonal branch stenosis, 50% circumflex stenosis in the PDA with normal LV function.  He is completely asymptomatic.     Avanell Leigh MD FACP,FACC,FAHA, [facility] 06/09/2023 3:01 PM

## 2023-06-09 NOTE — Assessment & Plan Note (Signed)
 History of essential hypertension her blood pressure measured today at 130/86.  He is on HydroDIURIL , lisinopril  and metoprolol .

## 2023-06-21 ENCOUNTER — Ambulatory Visit: Payer: Medicare HMO | Admitting: Family Medicine

## 2023-06-21 ENCOUNTER — Encounter: Payer: Self-pay | Admitting: Family Medicine

## 2023-06-21 VITALS — BP 144/86 | HR 84 | Temp 98.2°F | Resp 20 | Ht 67.0 in | Wt 219.0 lb

## 2023-06-21 DIAGNOSIS — J069 Acute upper respiratory infection, unspecified: Secondary | ICD-10-CM

## 2023-06-21 MED ORDER — ALBUTEROL SULFATE HFA 108 (90 BASE) MCG/ACT IN AERS
2.0000 | INHALATION_SPRAY | Freq: Four times a day (QID) | RESPIRATORY_TRACT | 0 refills | Status: AC | PRN
Start: 2023-06-21 — End: ?

## 2023-06-21 NOTE — Patient Instructions (Addendum)
Continue symptom management.

## 2023-06-21 NOTE — Progress Notes (Signed)
Assessment & Plan:  1. Viral URI (Primary) Education provided on viral URI.  Discussed typical duration and progression of viral illnesses.  Encouraged symptom management including throat lozenges, chloraseptic spray, warm salt water gargles, hot tea/honey, cough syrup (Delsym), Tylenol/Ibuprofen, Vicks, and a humidifier at night.   - albuterol (VENTOLIN HFA) 108 (90 Base) MCG/ACT inhaler; Inhale 2 puffs into the lungs every 6 (six) hours as needed.  Dispense: 18 g; Refill: 0    Follow up plan: Return if symptoms worsen or fail to improve.  Deliah Boston, MSN, APRN, FNP-C  Subjective:  HPI: Justin Ayala is a 73 y.o. male presenting on 06/21/2023 for Cough (Cough, congestion - yellow, wheezing - this started on Friday/No fever, chills, or body aches /No known exposures to flu/covid )  Patient complains of cough, head congestion, wheezing, and diarrhea. He denies fever, shortness of breath, nausea, vomiting, and body aches . Onset of symptoms was 3 days ago, improved since that time. He is drinking moderate amounts of fluids. Evaluation to date: none. Treatment to date:  12 Hour Mucinex at night, cough suppressant during the day, Tylenol . He does not smoke.    ROS: Negative unless specifically indicated above in HPI.   Relevant past medical history reviewed and updated as indicated.   Allergies and medications reviewed and updated.   Current Outpatient Medications:    ASPIRIN ADULT LOW STRENGTH 81 MG EC tablet, TAKE 1 BY MOUTH DAILY, Disp: 90 tablet, Rfl: 0   Cholecalciferol (VITAMIN D-3) 1000 UNITS CAPS, Take by mouth. Take one daily, Disp: , Rfl:    Cranberry 125 MG TABS, Take 1 tablet by mouth 2 (two) times a week., Disp: , Rfl:    Evolocumab (REPATHA SURECLICK) 140 MG/ML SOAJ, Inject 140 mg into the skin every 14 (fourteen) days., Disp: 2 mL, Rfl: 5   ezetimibe (ZETIA) 10 MG tablet, Take 1 tablet (10 mg total) by mouth daily., Disp: 90 tablet, Rfl: 3   ferrous sulfate 325 (65  FE) MG tablet, Take 1 tablet (325 mg total) by mouth daily., Disp: 90 tablet, Rfl: 3   Glucosamine-Chondroitin (OSTEO BI-FLEX REGULAR STRENGTH PO), Take 1 tablet by mouth daily., Disp: , Rfl:    hydrochlorothiazide (HYDRODIURIL) 25 MG tablet, Take 1 tablet (25 mg total) by mouth daily. Annual appt due in May must see provider for future refills, Disp: 90 tablet, Rfl: 3   [place] Ginseng 100 MG CAPS, Take 1 capsule (100 mg total) by mouth daily., Disp: 90 each, Rfl: 0   lisinopril (ZESTRIL) 20 MG tablet, Take 1 tablet by mouth once daily, Disp: 90 tablet, Rfl: 1   meloxicam (MOBIC) 7.5 MG tablet, Take 1 tablet (7.5 mg total) by mouth daily., Disp: 90 tablet, Rfl: 3   metoprolol tartrate (LOPRESSOR) 25 MG tablet, TAKE 1/2 (ONE-HALF) TABLET BY MOUTH TWICE DAILY . APPOINTMENT REQUIRED FOR FUTURE REFILLS, Disp: 90 tablet, Rfl: 3   Resveratrol 100 MG CAPS, Take 1 capsule by mouth daily., Disp: , Rfl:    tamsulosin (FLOMAX) 0.4 MG CAPS capsule, Take 1 capsule (0.4 mg total) by mouth 2 (two) times daily., Disp: 180 capsule, Rfl: 3  Allergies  Allergen Reactions   Lipitor [Atorvastatin] Other (See Comments)    Muscle weakness, elevated LFTs    Objective:   BP (!) 144/86   Pulse 84   Temp 98.2 F (36.8 C)   Resp 20   Ht 5\' 7"  (1.702 m)   Wt 219 lb (99.3 kg)   SpO2 96%  BMI 34.30 kg/m    Physical Exam Vitals reviewed.  Constitutional:      General: He is not in acute distress.    Appearance: Normal appearance. He is not ill-appearing, toxic-appearing or diaphoretic.  HENT:     Head: Normocephalic and atraumatic.     Right Ear: Tympanic membrane, ear canal and external ear normal. There is no impacted cerumen.     Left Ear: Tympanic membrane, ear canal and external ear normal. There is no impacted cerumen.     Nose: Nose normal.     Right Sinus: No maxillary sinus tenderness or frontal sinus tenderness.     Left Sinus: No maxillary sinus tenderness or frontal sinus tenderness.      Mouth/Throat:     Mouth: Mucous membranes are moist.     Pharynx: Oropharynx is clear. No oropharyngeal exudate or posterior oropharyngeal erythema.     Tonsils: No tonsillar exudate or tonsillar abscesses.  Eyes:     General: No scleral icterus.       Right eye: No discharge.        Left eye: No discharge.     Conjunctiva/sclera: Conjunctivae normal.  Cardiovascular:     Rate and Rhythm: Normal rate.  Pulmonary:     Effort: Pulmonary effort is normal. No respiratory distress.  Musculoskeletal:        General: Normal range of motion.     Cervical back: Normal range of motion.  Lymphadenopathy:     Cervical: No cervical adenopathy.  Skin:    General: Skin is warm and dry.  Neurological:     Mental Status: He is alert and oriented to person, place, and time. Mental status is at baseline.  Psychiatric:        Mood and Affect: Mood normal.        Behavior: Behavior normal.        Thought Content: Thought content normal.        Judgment: Judgment normal.

## 2023-07-11 ENCOUNTER — Other Ambulatory Visit: Payer: Self-pay | Admitting: Cardiovascular Disease

## 2023-07-11 DIAGNOSIS — I2510 Atherosclerotic heart disease of native coronary artery without angina pectoris: Secondary | ICD-10-CM

## 2023-07-11 DIAGNOSIS — E782 Mixed hyperlipidemia: Secondary | ICD-10-CM

## 2023-07-21 ENCOUNTER — Encounter: Payer: Self-pay | Admitting: Cardiovascular Disease

## 2023-07-21 DIAGNOSIS — Z8546 Personal history of malignant neoplasm of prostate: Secondary | ICD-10-CM | POA: Diagnosis not present

## 2023-07-21 DIAGNOSIS — C61 Malignant neoplasm of prostate: Secondary | ICD-10-CM | POA: Diagnosis not present

## 2023-07-22 ENCOUNTER — Other Ambulatory Visit (HOSPITAL_COMMUNITY): Payer: Self-pay

## 2023-07-22 ENCOUNTER — Telehealth: Payer: Self-pay | Admitting: Pharmacy Technician

## 2023-07-22 ENCOUNTER — Encounter: Payer: Self-pay | Admitting: Pharmacy Technician

## 2023-07-22 NOTE — Telephone Encounter (Signed)
 Patient Advocate Encounter   The patient was approved for a [employer] grant that will help cover the cost of repatha Total amount awarded, 2500.00.  Effective: 06/22/23 - 06/20/24   ZOX:[id number] WUJ:[id number]   Pharmacy provided with approval and processing information. Patient informed via mychart

## 2023-07-28 ENCOUNTER — Ambulatory Visit: Payer: Medicare HMO | Admitting: Cardiovascular Disease

## 2023-07-28 DIAGNOSIS — C61 Malignant neoplasm of prostate: Secondary | ICD-10-CM | POA: Diagnosis not present

## 2023-07-28 DIAGNOSIS — N401 Enlarged prostate with lower urinary tract symptoms: Secondary | ICD-10-CM | POA: Diagnosis not present

## 2023-07-28 DIAGNOSIS — R3912 Poor urinary stream: Secondary | ICD-10-CM | POA: Diagnosis not present

## 2023-08-18 ENCOUNTER — Encounter: Payer: Self-pay | Admitting: Emergency Medicine

## 2023-08-18 ENCOUNTER — Ambulatory Visit (INDEPENDENT_AMBULATORY_CARE_PROVIDER_SITE_OTHER): Admitting: Emergency Medicine

## 2023-08-18 ENCOUNTER — Ambulatory Visit: Payer: Self-pay | Admitting: Internal Medicine

## 2023-08-18 VITALS — BP 128/74 | HR 69 | Temp 97.9°F | Ht 67.0 in | Wt 216.0 lb

## 2023-08-18 DIAGNOSIS — L089 Local infection of the skin and subcutaneous tissue, unspecified: Secondary | ICD-10-CM | POA: Diagnosis not present

## 2023-08-18 DIAGNOSIS — L989 Disorder of the skin and subcutaneous tissue, unspecified: Secondary | ICD-10-CM | POA: Diagnosis not present

## 2023-08-18 MED ORDER — DOXYCYCLINE HYCLATE 100 MG PO TABS
100.0000 mg | ORAL_TABLET | Freq: Two times a day (BID) | ORAL | 0 refills | Status: AC
Start: 2023-08-18 — End: 2023-08-25

## 2023-08-18 MED ORDER — MUPIROCIN 2 % EX OINT
TOPICAL_OINTMENT | CUTANEOUS | 0 refills | Status: AC
Start: 2023-08-18 — End: ?

## 2023-08-18 NOTE — Assessment & Plan Note (Signed)
 Recommend dermatology evaluation.  Referral placed today.

## 2023-08-18 NOTE — Patient Instructions (Signed)
Impetigo, Adult Impetigo is an infection of the skin. It commonly occurs in young children, but it can also occur in adults. The infection causes itchy blisters and sores that produce brownish-yellow fluid. As the fluid dries, it forms a thick, honey-colored crust. These skin changes usually occur on the face, but they can also affect other areas of the body. Impetigo usually goes away in 7-10 days with treatment. What are the causes? This condition is caused by two types of bacteria. It may be caused by staphylococci or streptococci bacteria. These bacteria cause impetigo when they get under the surface of the skin. This often happens after some damage to the skin, such as: Cuts, scrapes, or scratches. Rashes. Insect bites, especially when you scratch the area of a bite. Chickenpox or other illnesses that cause open skin sores. Nail biting or chewing. Impetigo can spread easily from one person to another (is contagious). It may be spread through close skin contact or by sharing towels, clothing, or other items that an infected person has touched. Scratching the affected area can cause impetigo to spread to other parts of the body. The bacteria can get under your fingernails and spread when you touch another area of your skin. What increases the risk? The following factors may make you more likely to develop this condition: Playing sports that include skin-to-skin contact with others. Having broken skin, such as from a cut or scrape. Living in an area that has high humidity levels. Having poor hygiene. Having high levels of staphylococci in your nose. Having a condition that weakens the skin integrity, such as: Having a weak body defense system (immune system). Having a skin condition with open sores, such as chickenpox. Having diabetes. What are the signs or symptoms? The main symptom of this condition is small blisters, often on the face around the mouth and nose. In time, the blisters break  open and turn into tiny sores (lesions) with a yellow crust. In some cases, the blisters cause itching or burning. Scratching, irritation, or lack of treatment may cause these small lesions to get larger. Other possible symptoms include: Larger blisters. Pus. Swollen lymph glands. How is this diagnosed? This condition is usually diagnosed during a physical exam. A skin sample or a sample of fluid from a blister may be taken for lab tests that involve growing bacteria (culture test). Lab tests can help confirm the diagnosis or help determine the best treatment. How is this treated? Treatment for this condition depends on the severity of the condition: Mild impetigo can be treated with prescription antibiotic cream. Oral antibiotic medicine may be used in more severe cases. Medicines that reduce itchiness (antihistamines)may also be used. Follow these instructions at home: Medicines Take over-the-counter and prescription medicines only as told by your health care provider. Apply or take your antibiotic as told by your health care provider. Do not stop using the antibiotic even if your condition improves. Before applying antibiotic cream or ointment, you should: Gently wash the infected areas with antibacterial soap and warm water. Soak crusted areas in warm, soapy water using antibacterial soap. Gently rub the areas to remove crusts. Do not scrub. Preventing the spread of infection  To help prevent impetigo from spreading to other body areas: Keep your fingernails short and clean. Do not scratch the blisters or sores. Cover infected areas, if necessary, to keep from scratching. Wash your hands often with soap and warm water. To help prevent impetigo from spreading to other people: Do not share towels.   Wash your clothing and bedsheets in water that is 140F (60C) or warmer. Stay home until you have used an antibiotic cream for 48 hours (2 days) or an oral antibiotic medicine for 24 hours  (1 day). You should only return to work and activities with other people if your skin shows significant improvement. You may return to contact sports after you have used antibiotic medicine for 72 hours (3 days). General instructions Keep all follow-up visits. This is important. How is this prevented? Wash your hands often with soap and warm water. Do not share towels, washcloths, clothing, bedding, or razors. Keep your fingernails short. Keep any cuts, scrapes, bug bites, or rashes clean and covered. Use insect repellent to prevent bug bites. Contact a health care provider if: You develop more blisters or sores, even with treatment. Other family members get sores. Your skin sores are not improving after 72 hours (3 days) of treatment. You have a fever. Get help right away if: You see spreading redness or swelling of the skin around your sores. You develop a sore throat. The area around your rash becomes warm, red, or tender to the touch. You have dark, reddish-brown urine. You do not urinate often or you urinate small amounts. You are very tired (lethargic). You have swelling in your face, hands, or feet. Summary Impetigo is a skin infection that causes itchy blisters and sores that produce brownish-yellow fluid. As the fluid dries, it forms a crust. This condition is caused by staphylococci or streptococci bacteria. These bacteria cause impetigo when they get under the surface of the skin, such as through cuts, rashes, bug bites, or open sores. Treatment for this condition may include antibiotic ointment or oral antibiotics. To help prevent impetigo from spreading to other body areas, make sure you keep your fingernails short, avoid scratching, cover any blisters, and wash your hands often. If you have impetigo, stay home until you have used an antibiotic cream for 48 hours (2 days) or an oral antibiotic medicine for 24 hours (1 day). You should only return to work and activities with  other people if your skin shows significant improvement. This information is not intended to replace advice given to you by your health care provider. Make sure you discuss any questions you have with your health care provider. Document Revised: 10/11/2019 Document Reviewed: 10/11/2019 [employer] Patient Education  2024 [employer]  
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
## 2023-08-18 NOTE — Telephone Encounter (Signed)
   Chief Complaint: mole,left bottom eyelid droopy    Disposition: [] ED /[] Urgent Care (no appt availability in office) / [x] Appointment(In office/virtual)/ []  [facility]/ [] Home Care/ [] Refused Recommended Disposition /[] [facility]/ []  Follow-up with PCP Additional Notes: Pt calling with 2 concerns. Pt removed a mole himself on right temple. Pt states the area is size of dime and "is healing with white scab." Pt stated his left bottom eyelid is "droopier than normal." Pt stated after he removed mole, both bottom eyelids became droopier but the right lid "went back to normal droopy." Pt has appt today with Dr Alvy Bimler at 1420. RN gave care advice and pt verbalized understanding.         Copied from CRM [id number]. Topic: Clinical - Red Word Triage >> Aug 18, 2023 10:51 AM Almira Coaster wrote: Red Word that prompted transfer to Nurse Triage: Patient removed a mole from his face and feels like it may have gotten infected, itchiness, discoloration and swelling. Reason for Disposition  [1] Looks infected (spreading redness, pus) AND [2] no fever  Answer Assessment - Initial Assessment Questions 1. APPEARANCE of LESION: "What does it look like?"      Dried up  2. SIZE: "How big is it?" (e.g., inches, cm; or compare to size of pinhead, tip of pen, eraser, coin, pea, grape, ping pong ball)      Dime  3. COLOR: "What color is it?" "Is there more than one color?"     Whitish  4. SHAPE: "What shape is it?" (e.g., round, irregular)     Round  5. RAISED: "Does it stick up above the skin or is it flat?" (e.g., raised or elevated)     Scab  6. TENDER: "Does it hurt when you touch it?"  (Scale 1-10; or mild, moderate, severe)     Denis  7. LOCATION: "Where is it located?"      Right temple   9. NUMBER: "Is there just one?" or "Are there others?"     One  10. CAUSE: "What do you think it is?"       Pt removed mole by himself  11. OTHER SYMPTOMS: "Do you have any other  symptoms?" (e.g., fever)       Under left eye swollen/droopy  Protocols used: Skin Lesion - Moles or Growths-A-AH

## 2023-08-21 ENCOUNTER — Encounter: Payer: Self-pay | Admitting: Emergency Medicine

## 2023-08-23 NOTE — Telephone Encounter (Signed)
 Stop doxycycline and take Benadryl over-the-counter. Recommend to only use topical Bactroban that was prescribed.  Thanks.

## 2023-08-24 NOTE — Telephone Encounter (Signed)
 No, I'm not kidding. My recommendation is to take Benadryl as needed for swelling and itching, and use topical antibiotic for infection.  If infection seems worse, he needs to be seen again, this time by his PCP.

## 2023-09-01 DIAGNOSIS — H40023 Open angle with borderline findings, high risk, bilateral: Secondary | ICD-10-CM | POA: Diagnosis not present

## 2023-09-01 DIAGNOSIS — H11152 Pinguecula, left eye: Secondary | ICD-10-CM | POA: Diagnosis not present

## 2023-09-01 DIAGNOSIS — H26492 Other secondary cataract, left eye: Secondary | ICD-10-CM | POA: Diagnosis not present

## 2023-09-01 DIAGNOSIS — H04123 Dry eye syndrome of bilateral lacrimal glands: Secondary | ICD-10-CM | POA: Diagnosis not present

## 2023-09-07 ENCOUNTER — Ambulatory Visit (INDEPENDENT_AMBULATORY_CARE_PROVIDER_SITE_OTHER): Admitting: Internal Medicine

## 2023-09-07 ENCOUNTER — Encounter: Payer: Self-pay | Admitting: Internal Medicine

## 2023-09-07 VITALS — BP 138/80 | HR 71 | Temp 98.1°F | Ht 67.0 in | Wt 217.0 lb

## 2023-09-07 DIAGNOSIS — L989 Disorder of the skin and subcutaneous tissue, unspecified: Secondary | ICD-10-CM

## 2023-09-07 MED ORDER — CEPHALEXIN 500 MG PO CAPS: 500.0000 mg | ORAL_CAPSULE | Freq: Two times a day (BID) | ORAL | 0 refills | Status: DC

## 2023-09-07 NOTE — Progress Notes (Signed)
   Subjective:   Patient ID: Justin Ayala, male    DOB: 09/02/1950, 73 y.o.   MRN: [id number]  Rash Pertinent negatives include no cough, diarrhea, shortness of breath or vomiting.   The patient is a 73 YO man coming in for concerns about infection on the face. Seen and rx doxycycline which caused side effects after 2 days of use. Got swelling on his face, no SOB or tongue/mouth swelling. This is coming down since being off. He wants to know if he needs to take something for this.  Review of Systems  Constitutional: Negative.   HENT: Negative.    Eyes: Negative.   Respiratory:  Negative for cough, chest tightness and shortness of breath.   Cardiovascular:  Negative for chest pain, palpitations and leg swelling.  Gastrointestinal:  Negative for abdominal distention, abdominal pain, constipation, diarrhea, nausea and vomiting.  Musculoskeletal: Negative.   Skin:  Positive for rash.  Neurological: Negative.   Psychiatric/Behavioral: Negative.      Objective:  Physical Exam Constitutional:      Appearance: He is well-developed.  HENT:     Head: Normocephalic and atraumatic.  Cardiovascular:     Rate and Rhythm: Normal rate and regular rhythm.  Pulmonary:     Effort: Pulmonary effort is normal. No respiratory distress.     Breath sounds: Normal breath sounds. No wheezing or rales.  Abdominal:     General: Bowel sounds are normal. There is no distension.     Palpations: Abdomen is soft.     Tenderness: There is no abdominal tenderness. There is no rebound.  Musculoskeletal:     Cervical back: Normal range of motion.  Skin:    General: Skin is warm and dry.     Findings: Rash present.     Comments: Redness with swelling left face   Neurological:     Mental Status: He is alert and oriented to person, place, and time.     Coordination: Coordination normal.     Vitals:   09/07/23 1335  BP: 138/80  Pulse: 71  Temp: 98.1 F (36.7 C)  TempSrc: Oral  SpO2: 98%  Weight: 217  lb (98.4 kg)  Height: 5\' 7"  (1.702 m)    Assessment & Plan:

## 2023-09-07 NOTE — Patient Instructions (Signed)
We have sent in keflex (cephalexin) to take 1 pill twice a day for 5 days.    

## 2023-09-07 NOTE — Assessment & Plan Note (Signed)
 Added doxycycline to allergy list and rx cephalexin 5 day course to help clear area.

## 2023-09-08 ENCOUNTER — Other Ambulatory Visit: Payer: Self-pay | Admitting: Cardiovascular Disease

## 2023-09-08 DIAGNOSIS — I10 Essential (primary) hypertension: Secondary | ICD-10-CM

## 2023-10-01 ENCOUNTER — Encounter (HOSPITAL_COMMUNITY): Payer: Self-pay

## 2023-10-08 ENCOUNTER — Ambulatory Visit

## 2023-10-08 VITALS — Ht 67.0 in | Wt 210.0 lb

## 2023-10-08 DIAGNOSIS — K635 Polyp of colon: Secondary | ICD-10-CM | POA: Diagnosis not present

## 2023-10-08 DIAGNOSIS — Z0000 Encounter for general adult medical examination without abnormal findings: Secondary | ICD-10-CM | POA: Diagnosis not present

## 2023-10-08 NOTE — Progress Notes (Signed)
 Subjective:   Justin Ayala is a 73 y.o. who presents for a Medicare Wellness preventive visit.  As a reminder, Annual Wellness Visits don't include a physical exam, and some assessments may be limited, especially if this visit is performed virtually. We may recommend an in-person follow-up visit with your provider if needed.  Visit Complete: Virtual I connected with  Justin Ayala on 10/08/23 by a audio enabled telemedicine application and verified that I am speaking with the correct person using two identifiers.  Patient Location: Home  Provider Location: Office/Clinic  I discussed the limitations of evaluation and management by telemedicine. The patient expressed understanding and agreed to proceed.  Vital Signs: Because this visit was a virtual/telehealth visit, some criteria may be missing or patient reported. Any vitals not documented were not able to be obtained and vitals that have been documented are patient reported.  VideoDeclined- This patient declined Librarian, academic. Therefore the visit was completed with audio only.  Persons Participating in Visit: Patient.  AWV Questionnaire: No: Patient Medicare AWV questionnaire was not completed prior to this visit.  Cardiac Risk Factors include: advanced age (>52men, >33 women);hypertension;dyslipidemia;male gender;obesity (BMI >30kg/m2)     Objective:     Today's Vitals   10/08/23 1054  Weight: 210 lb (95.3 kg)  Height: 5\' 7"  (1.702 m)   Body mass index is 32.89 kg/m.     10/08/2023   10:51 AM 01/02/2022   12:41 PM 09/19/2021    9:20 AM 08/25/2021    5:25 PM 01/18/2020    8:04 AM 01/09/2015    9:04 AM 10/10/2013    8:50 AM  Advanced Directives  Does Patient Have a Medical Advance Directive? No No No No No No Patient does not have advance directive;Patient would like information  Would patient like information on creating a medical advance directive? No - Patient declined  No - Guardian  declined No - Patient declined No - Patient declined No - patient declined information Advance directive brochure given (Outpatient ONLY)    Current Medications (verified) Outpatient Encounter Medications as of 10/08/2023  Medication Sig   albuterol  (VENTOLIN  HFA) 108 (90 Base) MCG/ACT inhaler Inhale 2 puffs into the lungs every 6 (six) hours as needed.   ASPIRIN  ADULT LOW STRENGTH 81 MG EC tablet TAKE 1 BY MOUTH DAILY   Cholecalciferol (VITAMIN D-3) 1000 UNITS CAPS Take by mouth. Take one daily   Cranberry 125 MG TABS Take 1 tablet by mouth 2 (two) times a week.   ezetimibe  (ZETIA ) 10 MG tablet Take 1 tablet (10 mg total) by mouth daily.   ferrous sulfate  325 (65 FE) MG tablet Take 1 tablet (325 mg total) by mouth daily.   Glucosamine-Chondroitin (OSTEO BI-FLEX REGULAR STRENGTH PO) Take 1 tablet by mouth daily.   hydrochlorothiazide  (HYDRODIURIL ) 25 MG tablet Take 1 tablet (25 mg total) by mouth daily. Annual appt due in May must see provider for future refills   [place] Ginseng 100 MG CAPS Take 1 capsule (100 mg total) by mouth daily.   lisinopril  (ZESTRIL ) 20 MG tablet Take 1 tablet by mouth once daily   meloxicam  (MOBIC ) 7.5 MG tablet Take 1 tablet (7.5 mg total) by mouth daily.   metoprolol  tartrate (LOPRESSOR ) 25 MG tablet TAKE 1/2 (ONE-HALF) TABLET BY MOUTH TWICE DAILY . APPOINTMENT REQUIRED FOR FUTURE REFILLS   mupirocin  ointment (BACTROBAN ) 2 % Apply to affected area twice a day for 7 days   REPATHA  SURECLICK 140 MG/ML SOAJ INJECT 140 MG INTO  THE SKIN EVERY 14 DAYS   Resveratrol 100 MG CAPS Take 1 capsule by mouth daily.   tamsulosin  (FLOMAX ) 0.4 MG CAPS capsule Take 1 capsule (0.4 mg total) by mouth 2 (two) times daily.   [DISCONTINUED] cephALEXin  (KEFLEX ) 500 MG capsule Take 1 capsule (500 mg total) by mouth 2 (two) times daily.   No facility-administered encounter medications on file as of 10/08/2023.    Allergies (verified) Doxycycline  and Lipitor [facility] ]    History: Past Medical History:  Diagnosis Date   Allergic rhinitis    Anemia 2010   CAD (coronary artery disease)    60-70% prox 1st diagonal branch stenosis, 50% dominant circumflex stenosis in PDA, normal LV function (by 10/12/2008 cath)   GERD (gastroesophageal reflux disease)    History of cardiovascular stress test 10/02/2008   exercise tolerance test - abnormal test - subsequent cath    HLD (hyperlipidemia)    HTN (hypertension)    Kidney stones    Overweight(278.02)    Peptic ulcer    Prostate cancer [facility])    Past Surgical History:  Procedure Laterality Date   APPENDECTOMY  12/2008   Dr. Vann Gent   BIOPSY PROSTATE  03/19/11   gleason 3+3=6, volume 34 cc, psa 01/06/11 5.00   CARDIAC CATHETERIZATION  2010   non-critical stenosis   FLEXIBLE SIGMOIDOSCOPY N/A 08/18/2013   Procedure: FLEXIBLE SIGMOIDOSCOPY;  Surgeon: Almeda Aris, MD;  Location: [facility];  Service: Endoscopy;  Laterality: N/A;   HOT HEMOSTASIS N/A 08/18/2013   Procedure: HOT HEMOSTASIS (ARGON PLASMA COAGULATION/BICAP);  Surgeon: Almeda Aris, MD;  Location: [facility];  Service: Endoscopy;  Laterality: N/A;  AVM - sig colon   RADIOACTIVE SEED IMPLANT  08/14/2011   Procedure: RADIOACTIVE SEED IMPLANT;  Surgeon: Mark C Ottelin, MD;  Location: [facility];  Service: Urology;  Laterality: N/A;  84  SEEDS IMPLANTED IN PROSTATE   TONSILLECTOMY     Family History  Problem Relation Age of Onset   Hypertension Father        also organ failure   Stroke Father    Hypertension Mother    Cervical cancer Mother    Prostate cancer Brother        seed implant   Heart failure Paternal Grandmother    Heart failure Paternal Grandfather    Diabetes Neg Hx    Social History   Socioeconomic History   Marital status: Married    Spouse name: Not on file   Number of children: 3   Years of education: BS    Highest education level: Bachelor's degree (e.g., BA, AB, BS)  Occupational History     Employer: [employer]  Tobacco Use   Smoking status: Never   Smokeless tobacco: Never  Substance and Sexual Activity   Alcohol use: No   Drug use: No   Sexual activity: Yes    Partners: Female  Other Topics Concern   Not on file  Social History Narrative   married x 30+years; 3 children, 4 grandsons    no smoking or ETOH;    Retired Pensions consultant formerly at [employer];    likes to golf (golfs 3-4 times a week); no exercise and rides cart while golfing         Social Drivers of Corporate investment banker Strain: Low Risk  (10/08/2023)   Overall Financial Resource Strain (CARDIA)    Difficulty of Paying Living Expenses: Not hard at all  Food Insecurity: No Food Insecurity (10/08/2023)  Hunger Vital Sign    Worried About Running Out of Food in the Last Year: Never true    Ran Out of Food in the Last Year: Never true  Transportation Needs: No Transportation Needs (10/08/2023)   PRAPARE - Administrator, Civil Service (Medical): No    Lack of Transportation (Non-Medical): No  Physical Activity: Sufficiently Active (10/08/2023)   Exercise Vital Sign    Days of Exercise per Week: 3 days    Minutes of Exercise per Session: 60 min  Stress: No Stress Concern Present (10/08/2023)   [employer] of Occupational Health - Occupational Stress Questionnaire    Feeling of Stress : Not at all  Social Connections: Socially Integrated (10/08/2023)   Social Connection and Isolation Panel [NHANES]    Frequency of Communication with Friends and Family: More than three times a week    Frequency of Social Gatherings with Friends and Family: More than three times a week    Attends Religious Services: More than 4 times per year    Active Member of [employer] or Organizations: Yes    Attends Engineer, structural: More than 4 times per year    Marital Status: Married    Tobacco Counseling Counseling given: No    Clinical Intake:  Pre-visit preparation completed:  Yes  Pain : No/denies pain     BMI - recorded: 32.89 Nutritional Risks: None Diabetes: No  Lab Results  Component Value Date   HGBA1C 5.9 10/24/2018   HGBA1C 6.0 09/03/2017   HGBA1C 5.8 (H) 12/20/2012     How often do you need to have someone help you when you read instructions, pamphlets, or other written materials from your doctor or pharmacy?: 1 - Never  Interpreter Needed?: No  Information entered by :: Kandy Orris, CMA   Activities of Daily Living     10/08/2023   10:56 AM  In your present state of health, do you have any difficulty performing the following activities:  Hearing? 0  Vision? 0  Difficulty concentrating or making decisions? 0  Walking or climbing stairs? 0  Dressing or bathing? 0  Doing errands, shopping? 0  Preparing Food and eating ? N  Using the Toilet? N  In the past six months, have you accidently leaked urine? N  Do you have problems with loss of bowel control? N  Managing your Medications? N  Managing your Finances? N  Housekeeping or managing your Housekeeping? N    Patient Care Team: Adelia Homestead, MD as PCP - General (Internal Medicine) Avanell Leigh, MD as PCP - Cardiology (Cardiology) Jonathan Neighbor, [facility] (Inactive) as Pharmacist (Pharmacist) Amedeo Jupiter, MD as Consulting Physician (Ophthalmology)  Indicate any recent Medical Services you may have received from other than [facility] providers in the past year (date may be approximate).     Assessment:    This is a routine wellness examination for [place].  Hearing/Vision screen Hearing Screening - Comments:: Denies hearing difficulties   Vision Screening - Comments:: Wears rx glasses - up to date with routine eye exams with Dr Lasandra Points   Goals Addressed               This Visit's Progress     Patient Stated (pt-stated)        Patient stated he plans to exercise more.       Depression Screen     10/08/2023   10:58 AM 08/18/2023    2:29 PM 06/21/2023  11:30 AM 11/10/2022    1:44 PM 02/09/2022    9:44 AM 09/22/2021    9:59 AM 09/19/2021    9:25 AM  PHQ 2/9 Scores  PHQ - 2 Score 0 0 0 0 0 0 0  PHQ- 9 Score 0    0 0     Fall Risk     10/08/2023   10:58 AM 08/18/2023    2:29 PM 06/21/2023   11:30 AM 11/10/2022    1:42 PM 09/22/2021   10:00 AM  Fall Risk   Falls in the past year? 0 0 0 0 0  Number falls in past yr: 0 0 0 0   Injury with Fall? 0 0 0 0   Risk for fall due to : No Fall Risks No Fall Risks No Fall Risks    Follow up Falls prevention discussed;Falls evaluation completed Falls evaluation completed Falls evaluation completed Falls evaluation completed     MEDICARE RISK AT HOME:  Medicare Risk at Home Any stairs in or around the home?: Yes If so, are there any without handrails?: No Home free of loose throw rugs in walkways, pet beds, electrical cords, etc?: Yes Adequate lighting in your home to reduce risk of falls?: Yes Life alert?: No Use of a cane, walker or w/c?: No Grab bars in the bathroom?: Yes Shower chair or bench in shower?: No Elevated toilet seat or a handicapped toilet?: Yes  TIMED UP AND GO:  Was the test performed?  No  Cognitive Function: 6CIT completed        10/08/2023   11:02 AM 09/19/2021    9:35 AM  6CIT Screen  What Year? 0 points 0 points  What month? 0 points 0 points  What time? 0 points 0 points  Count back from 20 0 points 0 points  Months in reverse 0 points 0 points  Repeat phrase 0 points 0 points  Total Score 0 points 0 points    Immunizations Immunization History  Administered Date(s) Administered   PFIZER(Purple Top)SARS-COV-2 Vaccination 07/16/2019, 08/09/2019   Td 05/25/2001   Tdap 12/20/2012    Screening Tests Health Maintenance  Topic Date Due   COVID-19 Vaccine (3 - Pfizer risk series) 10/24/2023 (Originally 09/06/2019)   Zoster Vaccines- Shingrix (1 of 2) 01/08/2024 (Originally 11/18/1967)   DTaP/Tdap/Td (3 - Td or Tdap) 10/07/2024 (Originally 12/21/2022)    Pneumonia Vaccine 63+ Years old (1 of 2 - PCV) 10/07/2024 (Originally 11/18/1967)   INFLUENZA VACCINE  12/24/2023   Colonoscopy  02/16/2024   Medicare Annual Wellness (AWV)  10/07/2024   Hepatitis C Screening  Completed   HPV VACCINES  Aged Out   Meningococcal B Vaccine  Aged Out    Health Maintenance  There are no preventive care reminders to display for this patient.  Health Maintenance Items Addressed:   Referral sent to GI for colonoscopy  Pt declines to have the COVID, Pneumonia, Tdap, and Shingles vaccines.    Additional Screening:  Vision Screening: Recommended annual ophthalmology exams for early detection of glaucoma and other disorders of the eye.  Dental Screening: Recommended annual dental exams for proper oral hygiene  Community Resource Referral / Chronic Care Management: CRR required this visit?  No   CCM required this visit?  No   Plan:    I have personally reviewed and noted the following in the patient's chart:   Medical and social history Use of alcohol, tobacco or illicit drugs  Current medications and supplements including opioid prescriptions. Patient  is not currently taking opioid prescriptions. Functional ability and status Nutritional status Physical activity Advanced directives List of other physicians Hospitalizations, surgeries, and ER visits in previous 12 months Vitals Screenings to include cognitive, depression, and falls Referrals and appointments  In addition, I have reviewed and discussed with patient certain preventive protocols, quality metrics, and best practice recommendations. A written personalized care plan for preventive services as well as general preventive health recommendations were provided to patient.   Patria Bookbinder, CMA   10/08/2023   After Visit Summary: (MyChart) Due to this being a telephonic visit, the after visit summary with patients personalized plan was offered to patient via MyChart   Notes: Nothing  significant to report at this time.

## 2023-10-08 NOTE — Patient Instructions (Signed)
 Justin Ayala , Thank you for taking time out of your busy schedule to complete your Annual Wellness Visit with me. I enjoyed our conversation and look forward to speaking with you again next year. I, as well as your care team,  appreciate your ongoing commitment to your health goals. Please review the following plan we discussed and let me know if I can assist you in the future. Your Game plan/ To Do List    Referrals: If you haven't heard from the office you've been referred to, please reach out to them at the phone provided.  Referral to Dr Nickey Barn for a repeat Colonoscopy.   Follow up Visits: Next Medicare AWV with our clinical staff: 11/20/2024   Have you seen your provider in the last 6 months (3 months if uncontrolled diabetes)? Yes Next Office Visit with your provider: 11/15/2023  Clinician Recommendations:  Aim for 30 minutes of exercise or brisk walking, 6-8 glasses of water , and 5 servings of fruits and vegetables each day. Educated and advised on getting the COVID, Pneumonia, Tdap, and Shingles vaccines in 2025.      This is a list of the screening recommended for you and due dates:  Health Maintenance  Topic Date Due   COVID-19 Vaccine (3 - Pfizer risk series) 10/24/2023*   Zoster (Shingles) Vaccine (1 of 2) 01/08/2024*   DTaP/Tdap/Td vaccine (3 - Td or Tdap) 10/07/2024*   Pneumonia Vaccine (1 of 2 - PCV) 10/07/2024*   Flu Shot  12/24/2023   Colon Cancer Screening  02/16/2024   Medicare Annual Wellness Visit  10/07/2024   Hepatitis C Screening  Completed   HPV Vaccine  Aged Out   Meningitis B Vaccine  Aged Out  *Topic was postponed. The date shown is not the original due date.    Advanced directives: (Declined) Advance directive discussed with you today. Even though you declined this today, please call our office should you change your mind, and we can give you the proper paperwork for you to fill out. Advance Care Planning is important because it:  [x]  Makes sure you receive  the medical care that is consistent with your values, goals, and preferences  [x]  It provides guidance to your family and loved ones and reduces their decisional burden about whether or not they are making the right decisions based on your wishes.  Follow the link provided in your after visit summary or read over the paperwork we have mailed to you to help you started getting your Advance Directives in place. If you need assistance in completing these, please reach out to us  so that we can help you!

## 2023-10-10 ENCOUNTER — Other Ambulatory Visit: Payer: Self-pay | Admitting: Cardiovascular Disease

## 2023-10-10 DIAGNOSIS — I2510 Atherosclerotic heart disease of native coronary artery without angina pectoris: Secondary | ICD-10-CM

## 2023-10-10 DIAGNOSIS — E782 Mixed hyperlipidemia: Secondary | ICD-10-CM

## 2023-10-13 DIAGNOSIS — Z1211 Encounter for screening for malignant neoplasm of colon: Secondary | ICD-10-CM | POA: Diagnosis not present

## 2023-10-25 DIAGNOSIS — R9721 Rising PSA following treatment for malignant neoplasm of prostate: Secondary | ICD-10-CM | POA: Diagnosis not present

## 2023-10-30 ENCOUNTER — Other Ambulatory Visit: Payer: Self-pay | Admitting: Internal Medicine

## 2023-11-12 ENCOUNTER — Telehealth: Payer: Self-pay | Admitting: Internal Medicine

## 2023-11-12 NOTE — Telephone Encounter (Signed)
 Copied from CRM [id number]. Topic: General - Other >> Nov 12, 2023  8:31 AM Allyne Areola wrote: Reason for CRM: [employer] is calling to verify if the office had received medication documents they have been faxing since April. Last fax was sent yesterday. Best call back number is [phone]

## 2023-11-15 ENCOUNTER — Encounter: Payer: Self-pay | Admitting: Internal Medicine

## 2023-11-15 ENCOUNTER — Ambulatory Visit (INDEPENDENT_AMBULATORY_CARE_PROVIDER_SITE_OTHER): Admitting: Internal Medicine

## 2023-11-15 VITALS — BP 122/84 | HR 69 | Temp 97.6°F | Ht 67.0 in | Wt 214.0 lb

## 2023-11-15 DIAGNOSIS — E782 Mixed hyperlipidemia: Secondary | ICD-10-CM

## 2023-11-15 DIAGNOSIS — E278 Other specified disorders of adrenal gland: Secondary | ICD-10-CM

## 2023-11-15 DIAGNOSIS — I6521 Occlusion and stenosis of right carotid artery: Secondary | ICD-10-CM | POA: Diagnosis not present

## 2023-11-15 DIAGNOSIS — Z0000 Encounter for general adult medical examination without abnormal findings: Secondary | ICD-10-CM | POA: Diagnosis not present

## 2023-11-15 DIAGNOSIS — I10 Essential (primary) hypertension: Secondary | ICD-10-CM | POA: Diagnosis not present

## 2023-11-15 DIAGNOSIS — C61 Malignant neoplasm of prostate: Secondary | ICD-10-CM | POA: Diagnosis not present

## 2023-11-15 LAB — COMPREHENSIVE METABOLIC PANEL WITH GFR
ALT: 18 U/L (ref 0–53)
AST: 23 U/L (ref 0–37)
Albumin: 4.4 g/dL (ref 3.5–5.2)
Alkaline Phosphatase: 69 U/L (ref 39–117)
BUN: 17 mg/dL (ref 6–23)
CO2: 30 meq/L (ref 19–32)
Calcium: 9.3 mg/dL (ref 8.4–10.5)
Chloride: 102 meq/L (ref 96–112)
Creatinine, Ser: 1.22 mg/dL (ref 0.40–1.50)
GFR: 58.21 mL/min — ABNORMAL LOW (ref >60.00)
Glucose, Bld: 93 mg/dL (ref 70–99)
Potassium: 3.8 meq/L (ref 3.5–5.1)
Sodium: 139 meq/L (ref 135–145)
Total Bilirubin: 0.5 mg/dL (ref 0.2–1.2)
Total Protein: 7.5 g/dL (ref 6.0–8.3)

## 2023-11-15 LAB — CBC
HCT: 41.4 % (ref 39.0–52.0)
Hemoglobin: 13.9 g/dL (ref 13.0–17.0)
MCHC: 33.7 g/dL (ref 30.0–36.0)
MCV: 85.3 fl (ref 78.0–100.0)
Platelets: 231 10*3/uL (ref 150.0–400.0)
RBC: 4.85 Mil/uL (ref 4.22–5.81)
RDW: 12.8 % (ref 11.5–15.5)
WBC: 3.8 10*3/uL — ABNORMAL LOW (ref 4.0–10.5)

## 2023-11-15 LAB — LIPID PANEL
Cholesterol: 110 mg/dL (ref 0–200)
HDL: 43.7 mg/dL (ref >39.00)
LDL Cholesterol: 47 mg/dL (ref 0–99)
NonHDL: 66.23
Total CHOL/HDL Ratio: 3
Triglycerides: 98 mg/dL (ref 0.0–149.0)
VLDL: 19.6 mg/dL (ref 0.0–40.0)

## 2023-11-15 LAB — HEMOGLOBIN A1C: Hgb A1c MFr Bld: 6 % (ref 4.6–6.5)

## 2023-11-15 MED ORDER — METOPROLOL TARTRATE 25 MG PO TABS: ORAL_TABLET | ORAL | 3 refills | Status: AC

## 2023-11-15 MED ORDER — ASPIRIN 81 MG PO TBEC: 81.0000 mg | DELAYED_RELEASE_TABLET | Freq: Every day | ORAL | 3 refills | Status: AC

## 2023-11-15 MED ORDER — MELOXICAM 7.5 MG PO TABS: 7.5000 mg | ORAL_TABLET | Freq: Every day | ORAL | 3 refills | Status: AC

## 2023-11-15 MED ORDER — TAMSULOSIN HCL 0.4 MG PO CAPS: 0.4000 mg | ORAL_CAPSULE | Freq: Two times a day (BID) | ORAL | 3 refills | Status: AC

## 2023-11-15 MED ORDER — HYDROCHLOROTHIAZIDE 25 MG PO TABS: 25.0000 mg | ORAL_TABLET | Freq: Every day | ORAL | 3 refills | Status: AC

## 2023-11-15 NOTE — Progress Notes (Unsigned)
   Subjective:   Patient ID: Justin Ayala, male    DOB: January 27, 1951, 73 y.o.   MRN: [id number]  HPI The patient is here for physical.  PMH, [facility], social history reviewed and updated  Review of Systems  Constitutional: Negative.   HENT: Negative.    Eyes: Negative.   Respiratory:  Negative for cough, chest tightness and shortness of breath.   Cardiovascular:  Negative for chest pain, palpitations and leg swelling.  Gastrointestinal:  Negative for abdominal distention, abdominal pain, constipation, diarrhea, nausea and vomiting.  Musculoskeletal: Negative.   Skin: Negative.   Neurological: Negative.   Psychiatric/Behavioral: Negative.      Objective:  Physical Exam Constitutional:      Appearance: He is well-developed.  HENT:     Head: Normocephalic and atraumatic.   Cardiovascular:     Rate and Rhythm: Normal rate and regular rhythm.  Pulmonary:     Effort: Pulmonary effort is normal. No respiratory distress.     Breath sounds: Normal breath sounds. No wheezing or rales.  Abdominal:     General: Bowel sounds are normal. There is no distension.     Palpations: Abdomen is soft.     Tenderness: There is no abdominal tenderness. There is no rebound.   Musculoskeletal:     Cervical back: Normal range of motion.   Skin:    General: Skin is warm and dry.   Neurological:     Mental Status: He is alert and oriented to person, place, and time.     Coordination: Coordination normal.     Vitals:   11/15/23 1331  BP: 122/84  Pulse: 69  Temp: 97.6 F (36.4 C)  TempSrc: Oral  SpO2: 96%  Weight: 214 lb (97.1 kg)  Height: 5' 7 (1.702 m)    Assessment & Plan:

## 2023-11-16 ENCOUNTER — Ambulatory Visit: Payer: Self-pay | Admitting: Internal Medicine

## 2023-11-18 NOTE — Assessment & Plan Note (Signed)
 Reviewed notes from urology and last PSA improving <0.5

## 2023-11-18 NOTE — Assessment & Plan Note (Signed)
 Checking lipid panel and adjust as needed on repatha  and zetia .

## 2023-11-18 NOTE — Assessment & Plan Note (Signed)
 No further workup indicated. No new clinical signs of hormonal secretion.

## 2023-11-18 NOTE — Assessment & Plan Note (Signed)
 BP at goal on hydrochlorothiazide  and lisinopril  and metoprolol . Checking CMP and adjust as needed.

## 2023-11-18 NOTE — Assessment & Plan Note (Signed)
 Flu shot yearly. Pneumonia complete. Shingrix complete. Tetanus u pto date. Colonoscopy due scheduled. Counseled about sun safety and mole surveillance. Counseled about the dangers of distracted driving. Given 10 year screening recommendations.

## 2023-11-18 NOTE — Assessment & Plan Note (Signed)
 Checking lipid panel and taking repatha  and zetia . Adjust as needed. Taking aspirin  81 mg daily.

## 2023-12-02 NOTE — Telephone Encounter (Signed)
I have not received this form.

## 2024-01-04 ENCOUNTER — Other Ambulatory Visit: Payer: Self-pay | Admitting: Cardiovascular Disease

## 2024-01-04 DIAGNOSIS — I2510 Atherosclerotic heart disease of native coronary artery without angina pectoris: Secondary | ICD-10-CM

## 2024-01-04 DIAGNOSIS — E782 Mixed hyperlipidemia: Secondary | ICD-10-CM

## 2024-01-31 DIAGNOSIS — C61 Malignant neoplasm of prostate: Secondary | ICD-10-CM | POA: Diagnosis not present

## 2024-01-31 DIAGNOSIS — R9721 Rising PSA following treatment for malignant neoplasm of prostate: Secondary | ICD-10-CM | POA: Diagnosis not present

## 2024-02-09 ENCOUNTER — Other Ambulatory Visit: Payer: Self-pay | Admitting: Cardiovascular Disease

## 2024-02-11 ENCOUNTER — Telehealth: Payer: Self-pay

## 2024-02-11 NOTE — Telephone Encounter (Signed)
   Name: Justin Ayala  DOB: 05-16-1951  MRN: [id number]  Primary Cardiologist: Dorn Lesches, MD   Preoperative team, please contact this patient and set up a phone call appointment for further preoperative risk assessment. Please obtain consent and complete medication review. Thank you for your help.  I confirm that guidance regarding antiplatelet and oral anticoagulation therapy has been completed and, if necessary, noted below.  Regarding ASA therapy, we recommend continuation of ASA throughout the perioperative period.  However, if the surgeon feels that cessation of ASA is required in the perioperative period, it may be stopped 5-7 days prior to surgery with a plan to resume it as soon as felt to be feasible from a surgical standpoint in the post-operative period.   I also confirmed the patient resides in the state of [place] . As per [facility] Medical Board telemedicine laws, the patient must reside in the state in which the provider is licensed.   Justin JAYSON Braver, NP 02/11/2024, 12:10 PM [facility]

## 2024-02-11 NOTE — Telephone Encounter (Signed)
 Preop tele appt now scheduled for 02/15/51 @ 1:20, med rec and consent done. Is currently taking baby aspirin 

## 2024-02-11 NOTE — Telephone Encounter (Signed)
   Pre-operative Risk Assessment    Patient Name: Justin Ayala  DOB: 10-Nov-1950 MRN: [id number]   Date of last office visit: 06/09/23 Date of next office visit: None   Request for Surgical Clearance    Procedure:  Colonoscopy  Date of Surgery:  Clearance 02/22/24                                 Surgeon:  Dr.Hung Surgeon's Group or Practice Name:  [facility] Phone number:  [phone] Fax number:  [phone]   Type of Clearance Requested:   - Medical  - Pharmacy:  Hold Aspirin      Type of Anesthesia:  Propofol    Additional requests/questions:  N/A  SignedMerlynn LITTIE Essex   02/11/2024, 11:01 AM

## 2024-02-11 NOTE — Telephone Encounter (Signed)
  Patient Consent for Virtual Visit        Justin Ayala has provided verbal consent on 02/11/2024 for a virtual visit (video or telephone).   CONSENT FOR VIRTUAL VISIT FOR:  Justin Ayala  By participating in this virtual visit I agree to the following:  I hereby voluntarily request, consent and authorize [facility] and its employed or contracted physicians, physician assistants, nurse practitioners or other licensed health care professionals (the Practitioner), to provide me with telemedicine health care services (the "Services) as deemed necessary by the treating Practitioner. I acknowledge and consent to receive the Services by the Practitioner via telemedicine. I understand that the telemedicine visit will involve communicating with the Practitioner through live audiovisual communication technology and the disclosure of certain medical information by electronic transmission. I acknowledge that I have been given the opportunity to request an in-person assessment or other available alternative prior to the telemedicine visit and am voluntarily participating in the telemedicine visit.  I understand that I have the right to withhold or withdraw my consent to the use of telemedicine in the course of my care at any time, without affecting my right to future care or treatment, and that the Practitioner or I may terminate the telemedicine visit at any time. I understand that I have the right to inspect all information obtained and/or recorded in the course of the telemedicine visit and may receive copies of available information for a reasonable fee.  I understand that some of the potential risks of receiving the Services via telemedicine include:  Delay or interruption in medical evaluation due to technological equipment failure or disruption; Information transmitted may not be sufficient (e.g. poor resolution of images) to allow for appropriate medical decision making by the Practitioner;  and/or  In rare instances, security protocols could fail, causing a breach of personal health information.  Furthermore, I acknowledge that it is my responsibility to provide information about my medical history, conditions and care that is complete and accurate to the best of my ability. I acknowledge that Practitioner's advice, recommendations, and/or decision may be based on factors not within their control, such as incomplete or inaccurate data provided by me or distortions of diagnostic images or specimens that may result from electronic transmissions. I understand that the practice of medicine is not an exact science and that Practitioner makes no warranties or guarantees regarding treatment outcomes. I acknowledge that a copy of this consent can be made available to me via my patient portal [facility] MyChart), or I can request a printed copy by calling the office of [facility].    I understand that my insurance will be billed for this visit.   I have read or had this consent read to me. I understand the contents of this consent, which adequately explains the benefits and risks of the Services being provided via telemedicine.  I have been provided ample opportunity to ask questions regarding this consent and the Services and have had my questions answered to my satisfaction. I give my informed consent for the services to be provided through the use of telemedicine in my medical care

## 2024-02-15 ENCOUNTER — Ambulatory Visit: Attending: Cardiology | Admitting: Nurse Practitioner

## 2024-02-15 DIAGNOSIS — Z01810 Encounter for preprocedural cardiovascular examination: Secondary | ICD-10-CM

## 2024-02-15 NOTE — Progress Notes (Signed)
 Virtual Visit via Telephone Note   Because of Kveon Casanas co-morbid illnesses, he is at least at moderate risk for complications without adequate follow up.  This format is felt to be most appropriate for this patient at this time.  Due to technical limitations with video connection (technology), today's appointment will be conducted as an audio only telehealth visit, and Arsh Feutz verbally agreed to proceed in this manner.   All issues noted in this document were discussed and addressed.  No physical exam could be performed with this format.  Evaluation Performed:  Preoperative cardiovascular risk assessment _____________   Date:  02/15/2024   Patient ID:  Oluwatimileyin Vivier, DOB 1951-01-10, MRN [id number] Patient Location:  Home Provider location:   Office  Primary Care Provider:  Rollene Almarie LABOR, MD Primary Cardiologist:  Dorn Lesches, MD  Chief Complaint / Patient Profile   73 y.o. y/o male with a h/o CAD managed medically, hypertension, hyperlipidemia, anemia, prostate cancer, and GERD who is pending colonoscopy on 02/22/2024 with Dr. Rollin of [facility] and presents today for telephonic preoperative cardiovascular risk assessment.  History of Present Illness    Justin Ayala is a 73 y.o. male who presents via audio/video conferencing for a telehealth visit today.  Pt was last seen in cardiology clinic on 06/09/2023 by Dr. Lesches. At that time Terran Klinke was doing well. The patient is now pending procedure as outlined above. Since his last visit, he has done well from a cardiac standpoint.   He denies chest pain, palpitations, dyspnea, pnd, orthopnea, n, v, dizziness, syncope, edema, weight gain, or early satiety. All other systems reviewed and are otherwise negative except as noted above.   Past Medical History    Past Medical History:  Diagnosis Date   Allergic rhinitis    Anemia 2010   CAD (coronary artery disease)    60-70% prox 1st diagonal branch  stenosis, 50% dominant circumflex stenosis in PDA, normal LV function (by 10/12/2008 cath)   GERD (gastroesophageal reflux disease)    History of cardiovascular stress test 10/02/2008   exercise tolerance test - abnormal test - subsequent cath    HLD (hyperlipidemia)    HTN (hypertension)    Kidney stones    Overweight(278.02)    Peptic ulcer    Prostate cancer [facility])    Past Surgical History:  Procedure Laterality Date   APPENDECTOMY  12/2008   Dr. CANDIE Schultze   BIOPSY PROSTATE  03/19/11   gleason 3+3=6, volume 34 cc, psa 01/06/11 5.00   CARDIAC CATHETERIZATION  2010   non-critical stenosis   FLEXIBLE SIGMOIDOSCOPY N/A 08/18/2013   Procedure: FLEXIBLE SIGMOIDOSCOPY;  Surgeon: Belvie JONETTA Rollin, MD;  Location: [facility];  Service: Endoscopy;  Laterality: N/A;   HOT HEMOSTASIS N/A 08/18/2013   Procedure: HOT HEMOSTASIS (ARGON PLASMA COAGULATION/BICAP);  Surgeon: Belvie JONETTA Rollin, MD;  Location: [facility];  Service: Endoscopy;  Laterality: N/A;  AVM - sig colon   RADIOACTIVE SEED IMPLANT  08/14/2011   Procedure: RADIOACTIVE SEED IMPLANT;  Surgeon: Mark C Ottelin, MD;  Location: [facility];  Service: Urology;  Laterality: N/A;  84  SEEDS IMPLANTED IN PROSTATE   TONSILLECTOMY      Allergies  Allergies  Allergen Reactions   Doxycycline  Swelling   Lipitor [Atorvastatin ] Other (See Comments)    Muscle weakness, elevated LFTs    Home Medications    Prior to Admission medications   Medication Sig Start Date End Date Taking? Authorizing Provider  albuterol  (VENTOLIN  HFA)  108 (90 Base) MCG/ACT inhaler Inhale 2 puffs into the lungs every 6 (six) hours as needed. 06/21/23   Merlynn Niki FALCON, FNP  aspirin  EC (ASPIRIN  ADULT LOW STRENGTH) 81 MG tablet Take 1 tablet (81 mg total) by mouth daily. Swallow whole. 11/15/23   Rollene Almarie LABOR, MD  Cholecalciferol (VITAMIN D-3) 1000 UNITS CAPS Take by mouth. Take one daily    [provider]  Cranberry 125 MG TABS Take 1 tablet  by mouth 2 (two) times a week.    [provider]  Evolocumab  (REPATHA  SURECLICK) 140 MG/ML SOAJ INJECT 140 MG INTO THE SKIN EVERY 14 DAYS 01/04/24   Court Dorn PARAS, MD  ezetimibe  (ZETIA ) 10 MG tablet Take 1 tablet by mouth once daily 02/09/24   Court Dorn PARAS, MD  ferrous sulfate  325 (65 FE) MG tablet Take 1 tablet (325 mg total) by mouth daily. 05/11/14   Briana Elgin LABOR, MD  Glucosamine-Chondroitin (OSTEO BI-FLEX REGULAR STRENGTH PO) Take 1 tablet by mouth daily.    [provider]  hydrochlorothiazide  (HYDRODIURIL ) 25 MG tablet Take 1 tablet (25 mg total) by mouth daily. 11/15/23   Rollene Almarie LABOR, MD  [place] Ginseng 100 MG CAPS Take 1 capsule (100 mg total) by mouth daily. 05/11/14   Briana Elgin LABOR, MD  lisinopril  (ZESTRIL ) 20 MG tablet Take 1 tablet by mouth once daily 09/08/23   Court Dorn PARAS, MD  meloxicam  (MOBIC ) 7.5 MG tablet Take 1 tablet (7.5 mg total) by mouth daily. 11/15/23   Rollene Almarie LABOR, MD  metoprolol  tartrate (LOPRESSOR ) 25 MG tablet TAKE 1/2 (ONE-HALF) TABLET BY MOUTH TWICE DAILY . APPOINTMENT REQUIRED FOR FUTURE REFILLS 11/15/23   Rollene Almarie LABOR, MD  mupirocin  ointment (BACTROBAN ) 2 % Apply to affected area twice a day for 7 days 08/18/23   Purcell Emil Schanz, MD  Resveratrol 100 MG CAPS Take 1 capsule by mouth daily.    [provider]  tamsulosin  (FLOMAX ) 0.4 MG CAPS capsule Take 1 capsule (0.4 mg total) by mouth 2 (two) times daily. 11/15/23   Rollene Almarie LABOR, MD    Physical Exam    Vital Signs:  Taras Rask does not have vital signs available for review today.  Given telephonic nature of communication, physical exam is limited. AAOx3. NAD. Normal affect.  Speech and respirations are unlabored.  Accessory Clinical Findings    None  Assessment & Plan    1.  Preoperative Cardiovascular Risk Assessment:  According to the Revised Cardiac Risk Index (RCRI), his Perioperative Risk of Major Cardiac Event is  (%): 0.4. His Functional Capacity in METs is: 7.01 according to the Duke Activity Status Index (DASI). Therefore, based on ACC/AHA guidelines, patient would be at acceptable risk for the planned procedure without further cardiovascular testing. I  The patient was advised that if he develops new symptoms prior to surgery to contact our office to arrange for a follow-up visit, and he verbalized understanding.  Regarding ASA therapy, we recommend continuation of ASA throughout the perioperative period. However, if the surgeon feels that cessation of ASA is required in the perioperative period, it may be stopped 5-7 days prior to surgery with a plan to resume it as soon as felt to be feasible from a surgical standpoint in the post-operative period.   A copy of this note will be routed to requesting surgeon.  Time:   Today, I have spent 6 minutes with the patient with telehealth technology discussing medical history, symptoms, and management plan.  [name], NP  02/15/2024, 1:30 PM

## 2024-02-22 DIAGNOSIS — D123 Benign neoplasm of transverse colon: Secondary | ICD-10-CM | POA: Diagnosis not present

## 2024-02-22 DIAGNOSIS — K627 Radiation proctitis: Secondary | ICD-10-CM | POA: Diagnosis not present

## 2024-02-22 DIAGNOSIS — K5730 Diverticulosis of large intestine without perforation or abscess without bleeding: Secondary | ICD-10-CM | POA: Diagnosis not present

## 2024-02-22 DIAGNOSIS — Z1211 Encounter for screening for malignant neoplasm of colon: Secondary | ICD-10-CM | POA: Diagnosis not present

## 2024-02-22 DIAGNOSIS — Z860100 Personal history of colon polyps, unspecified: Secondary | ICD-10-CM | POA: Diagnosis not present

## 2024-02-22 DIAGNOSIS — K635 Polyp of colon: Secondary | ICD-10-CM | POA: Diagnosis not present

## 2024-02-22 DIAGNOSIS — K5520 Angiodysplasia of colon without hemorrhage: Secondary | ICD-10-CM | POA: Diagnosis not present

## 2024-03-13 DIAGNOSIS — C61 Malignant neoplasm of prostate: Secondary | ICD-10-CM | POA: Diagnosis not present

## 2024-03-13 DIAGNOSIS — Z8546 Personal history of malignant neoplasm of prostate: Secondary | ICD-10-CM | POA: Diagnosis not present

## 2024-03-20 DIAGNOSIS — C61 Malignant neoplasm of prostate: Secondary | ICD-10-CM | POA: Diagnosis not present

## 2024-03-20 DIAGNOSIS — R399 Unspecified symptoms and signs involving the genitourinary system: Secondary | ICD-10-CM | POA: Diagnosis not present

## 2024-05-31 ENCOUNTER — Encounter: Payer: Self-pay | Admitting: Cardiovascular Disease

## 2024-06-05 ENCOUNTER — Encounter: Payer: Self-pay | Admitting: Internal Medicine

## 2024-06-06 ENCOUNTER — Ambulatory Visit: Admitting: Internal Medicine

## 2024-06-06 ENCOUNTER — Encounter: Payer: Self-pay | Admitting: Internal Medicine

## 2024-06-06 VITALS — BP 128/80 | HR 73 | Temp 98.2°F | Ht 67.0 in | Wt 220.2 lb

## 2024-06-06 DIAGNOSIS — J069 Acute upper respiratory infection, unspecified: Secondary | ICD-10-CM

## 2024-06-06 DIAGNOSIS — K219 Gastro-esophageal reflux disease without esophagitis: Secondary | ICD-10-CM

## 2024-06-06 DIAGNOSIS — R059 Cough, unspecified: Secondary | ICD-10-CM

## 2024-06-06 DIAGNOSIS — I10 Essential (primary) hypertension: Secondary | ICD-10-CM

## 2024-06-06 DIAGNOSIS — J029 Acute pharyngitis, unspecified: Secondary | ICD-10-CM

## 2024-06-06 LAB — POC COVID19 BINAXNOW: SARS Coronavirus 2 Ag: NEGATIVE

## 2024-06-06 MED ORDER — LIDOCAINE VISCOUS HCL 2 % MT SOLN: 15.0000 mL | Freq: Four times a day (QID) | OROMUCOSAL | 0 refills | Status: AC | PRN

## 2024-06-06 MED ORDER — PANTOPRAZOLE SODIUM 20 MG PO TBEC: 20.0000 mg | DELAYED_RELEASE_TABLET | Freq: Every day | ORAL | 3 refills | Status: AC

## 2024-06-06 NOTE — Progress Notes (Unsigned)
 k  Subjective:   Patient ID: Justin Ayala, male    DOB: 1951-01-08, 74 y.o.   MRN: [id number]  Discussed the use of AI scribe software for clinical note transcription with the patient, who gave verbal consent to proceed.  History of Present Illness Justin Ayala is a 74 year old male who presents with cough, sore throat, and congestion.  He has been experiencing a cough, sore throat, and congestion for the past two days. The cough is described as low and similar to a chest cold. No fever or chills are present.  He has a history of acid reflux and was previously prescribed pantoprazole . He had stopped taking it regularly, opting for as-needed use, but experienced a recurrence of symptoms, particularly at night, prompting him to resume taking the medication.  He reports episodes of blood pressure spikes occurring about once a year, with readings reaching 173/90 mmHg. He manages these episodes by doubling his dose of metoprolol , which he takes at 10 mg daily, and this approach helps to control the spikes. He is unsure of the cause of these spikes but notes they occur annually.  Review of Systems  Constitutional:  Positive for activity change, appetite change and chills. Negative for fatigue, fever and unexpected weight change.  HENT:  Positive for congestion, postnasal drip, rhinorrhea and sinus pressure. Negative for ear discharge, ear pain, sinus pain, sneezing, sore throat, tinnitus, trouble swallowing and voice change.   Eyes: Negative.   Respiratory:  Positive for cough. Negative for chest tightness, shortness of breath and wheezing.   Cardiovascular: Negative.   Gastrointestinal: Negative.   Musculoskeletal:  Positive for myalgias.  Neurological: Negative.     Objective:  Physical Exam Constitutional:      Appearance: Normal appearance. He is well-developed.  HENT:     Head: Normocephalic and atraumatic.     Comments: Oropharynx with redness and clear drainage, nose with swollen  turbinates, TMs normal bilaterally.  Eyes:     Extraocular Movements: Extraocular movements intact.  Neck:     Thyroid: No thyromegaly.  Cardiovascular:     Rate and Rhythm: Normal rate and regular rhythm.  Pulmonary:     Effort: Pulmonary effort is normal. No respiratory distress.     Breath sounds: Normal breath sounds. No wheezing or rales.  Abdominal:     General: Bowel sounds are normal. There is no distension.     Palpations: Abdomen is soft.     Tenderness: There is no abdominal tenderness. There is no rebound.  Musculoskeletal:        General: Tenderness present.     Cervical back: Normal range of motion.  Lymphadenopathy:     Cervical: No cervical adenopathy.  Skin:    General: Skin is warm and dry.  Neurological:     Mental Status: He is alert and oriented to person, place, and time.     Coordination: Coordination normal.     Vitals:   06/06/24 1608  BP: 128/80  Pulse: 73  Temp: 98.2 F (36.8 C)  SpO2: 98%  Weight: 220 lb 3.2 oz (99.9 kg)  Height: 5' 7 (1.702 m)    Assessment and Plan Assessment & Plan Acute upper respiratory infection   The infection is likely viral, with COVID-19 and influenza as differential diagnoses. Consider COVID-19 testing as flu not available. This is done and negative. Advised supportive measures and rx lidocaine  viscous for sore throat.   Essential hypertension   He experiences episodes of elevated blood pressure, managed by  doubling the metoprolol  dose. No definitive cause has been identified. Continue current management with metoprolol , doubling the dose during episodes.  Gastroesophageal reflux disease   Symptoms recur, especially at night. Pantoprazole  was resumed due to recurrence. New rx for pantoprazole  40 mg daily done.

## 2024-06-09 ENCOUNTER — Encounter: Payer: Self-pay | Admitting: Internal Medicine

## 2024-06-13 ENCOUNTER — Other Ambulatory Visit: Payer: Self-pay | Admitting: Cardiovascular Disease

## 2024-06-13 DIAGNOSIS — I10 Essential (primary) hypertension: Secondary | ICD-10-CM

## 2024-06-21 ENCOUNTER — Other Ambulatory Visit: Payer: Self-pay | Admitting: Cardiovascular Disease

## 2024-06-21 DIAGNOSIS — E782 Mixed hyperlipidemia: Secondary | ICD-10-CM

## 2024-06-21 DIAGNOSIS — I2510 Atherosclerotic heart disease of native coronary artery without angina pectoris: Secondary | ICD-10-CM

## 2024-06-28 ENCOUNTER — Encounter: Payer: Self-pay | Admitting: Cardiovascular Disease

## 2024-06-30 ENCOUNTER — Encounter: Payer: Self-pay | Admitting: Cardiovascular Disease

## 2024-06-30 ENCOUNTER — Telehealth: Payer: Self-pay | Admitting: Pharmacy Technician

## 2024-06-30 NOTE — Telephone Encounter (Signed)
" ° °  Patient Advocate Encounter   The patient was approved for a [employer] grant that will help cover the cost of repatha  Total amount awarded, 2500.  Effective: 06/21/24 - 06/20/25   APW:[id number] ERW:[id number] Hmnle:[id number] PI:[id number] [employer] ID: [id number]   Pharmacy provided with approval and processing information. Patient informed via mychart  "

## 2024-06-30 NOTE — Telephone Encounter (Signed)
 Called pt scheduled an OV for Monday Feb 9 at 1:45 pm.

## 2024-07-03 ENCOUNTER — Ambulatory Visit: Admitting: Cardiovascular Disease

## 2024-11-20 ENCOUNTER — Encounter: Admitting: Internal Medicine

## 2024-11-20 ENCOUNTER — Ambulatory Visit
